# Patient Record
Sex: Female | Born: 1935 | Race: White | Hispanic: No | Marital: Married | Smoking: Never smoker
Health system: Southern US, Community
[De-identification: ages and names within clinical notes are randomized; demographics above are authoritative.]

## PROBLEM LIST (undated history)

## (undated) DIAGNOSIS — E785 Hyperlipidemia, unspecified: Secondary | ICD-10-CM

## (undated) DIAGNOSIS — M069 Rheumatoid arthritis, unspecified: Secondary | ICD-10-CM

## (undated) HISTORY — PX: CHOLECYSTECTOMY: SHX55

---

## 1998-08-30 ENCOUNTER — Other Ambulatory Visit: Admission: RE | Admit: 1998-08-30 | Discharge: 1998-08-30 | Payer: Self-pay | Admitting: Obstetrics and Gynecology

## 1999-02-20 ENCOUNTER — Ambulatory Visit (HOSPITAL_COMMUNITY): Admission: RE | Admit: 1999-02-20 | Discharge: 1999-02-20 | Payer: Self-pay | Admitting: Internal Medicine

## 1999-02-20 ENCOUNTER — Encounter: Payer: Self-pay | Admitting: Internal Medicine

## 1999-04-03 ENCOUNTER — Ambulatory Visit (HOSPITAL_COMMUNITY): Admission: RE | Admit: 1999-04-03 | Discharge: 1999-04-03 | Payer: Self-pay | Admitting: *Deleted

## 1999-04-03 ENCOUNTER — Encounter: Payer: Self-pay | Admitting: *Deleted

## 1999-10-30 ENCOUNTER — Encounter: Payer: Self-pay | Admitting: Internal Medicine

## 1999-10-30 ENCOUNTER — Ambulatory Visit (HOSPITAL_COMMUNITY): Admission: RE | Admit: 1999-10-30 | Discharge: 1999-10-30 | Payer: Self-pay | Admitting: Internal Medicine

## 2001-02-04 ENCOUNTER — Other Ambulatory Visit: Admission: RE | Admit: 2001-02-04 | Discharge: 2001-02-04 | Payer: Self-pay | Admitting: Obstetrics and Gynecology

## 2002-01-15 ENCOUNTER — Ambulatory Visit (HOSPITAL_COMMUNITY): Admission: RE | Admit: 2002-01-15 | Discharge: 2002-01-15 | Payer: Self-pay | Admitting: Gastroenterology

## 2002-01-15 ENCOUNTER — Encounter (INDEPENDENT_AMBULATORY_CARE_PROVIDER_SITE_OTHER): Payer: Self-pay | Admitting: Specialist

## 2002-01-19 ENCOUNTER — Ambulatory Visit (HOSPITAL_BASED_OUTPATIENT_CLINIC_OR_DEPARTMENT_OTHER): Admission: RE | Admit: 2002-01-19 | Discharge: 2002-01-19 | Payer: Self-pay | Admitting: Plastic Surgery

## 2002-04-22 ENCOUNTER — Emergency Department (HOSPITAL_COMMUNITY): Admission: EM | Admit: 2002-04-22 | Discharge: 2002-04-22 | Payer: Self-pay | Admitting: Emergency Medicine

## 2002-11-12 ENCOUNTER — Other Ambulatory Visit: Admission: RE | Admit: 2002-11-12 | Discharge: 2002-11-12 | Payer: Self-pay | Admitting: Obstetrics and Gynecology

## 2008-07-12 ENCOUNTER — Encounter: Admission: RE | Admit: 2008-07-12 | Discharge: 2008-07-12 | Payer: Self-pay | Admitting: Family Medicine

## 2008-07-12 IMAGING — US US ABDOMEN COMPLETE
1 series · 13 of 25 positions shown · non-contrast
Comparison: None.

CLINICAL DATA: Abdominal pain.  Nausea and vomiting.

COMPLETE ABDOMINAL ULTRASOUND [DATE]:

[Series 1: us abdomen complete · 0.23mm/px · 13 of 79 slices shown]
[im 1/79]
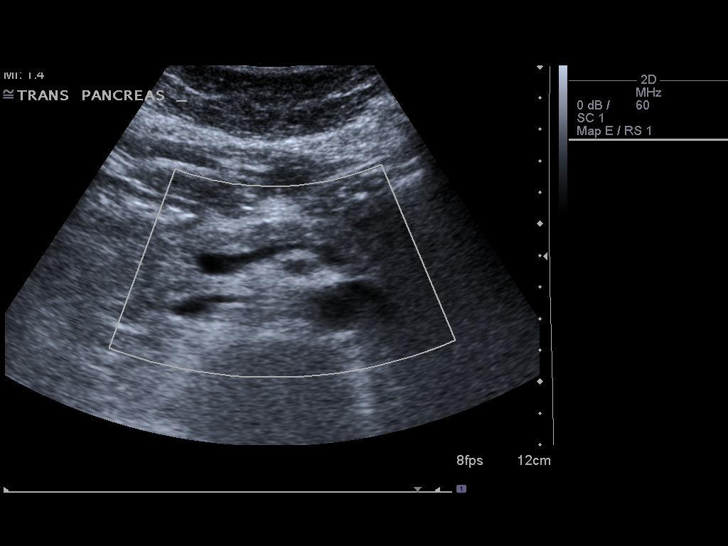
[im 7/79]
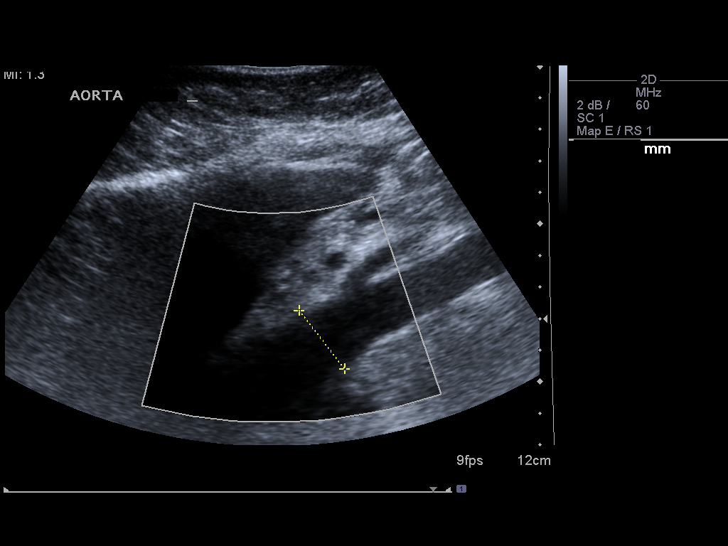
[im 14/79]
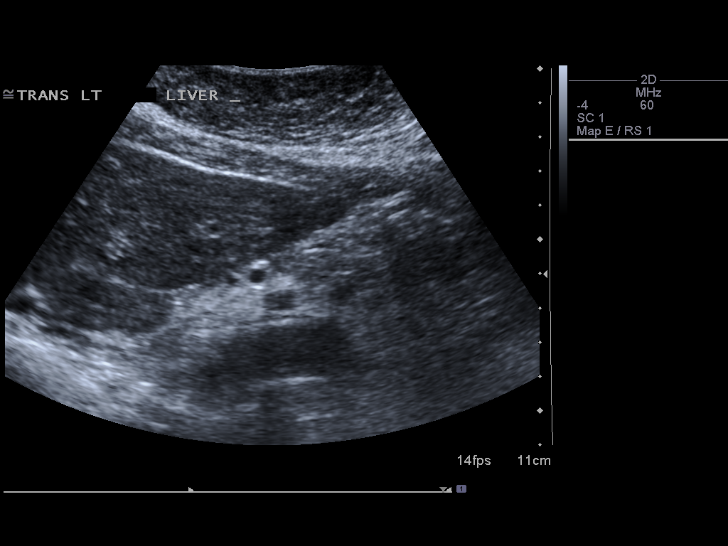
[im 20/79]
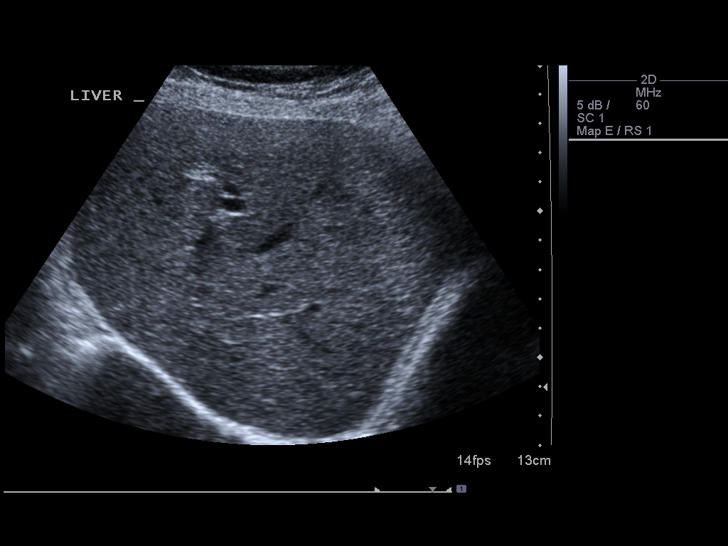
[im 27/79]
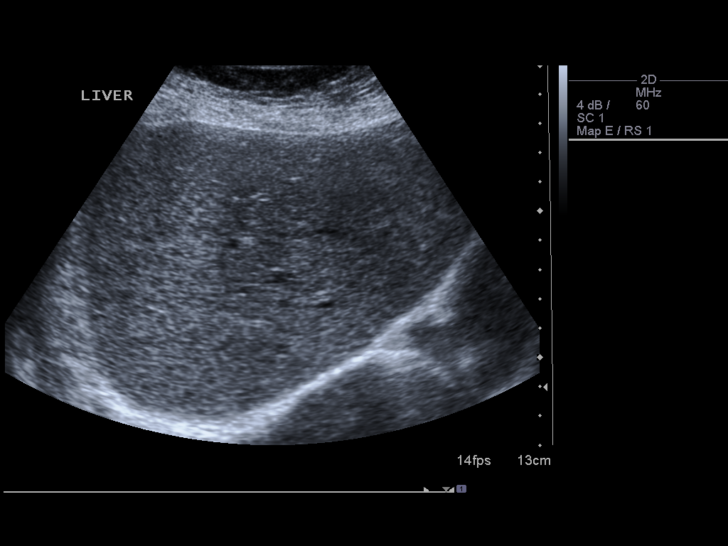
[im 33/79]
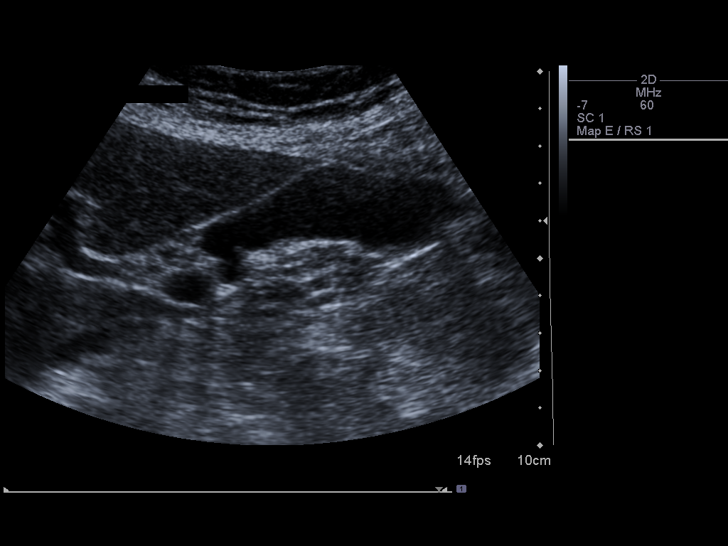
[im 40/79]
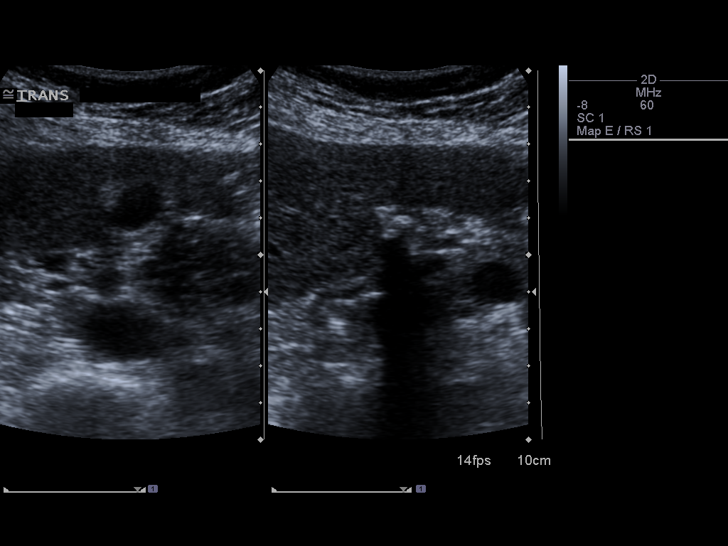
[im 46/79]
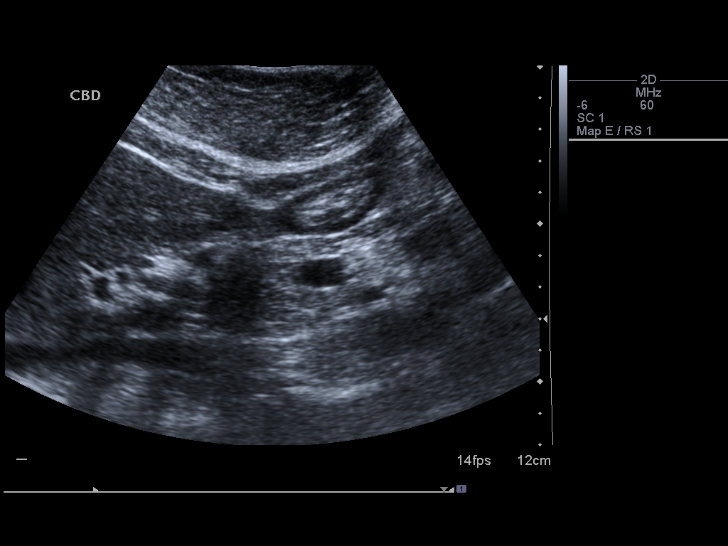
[im 53/79]
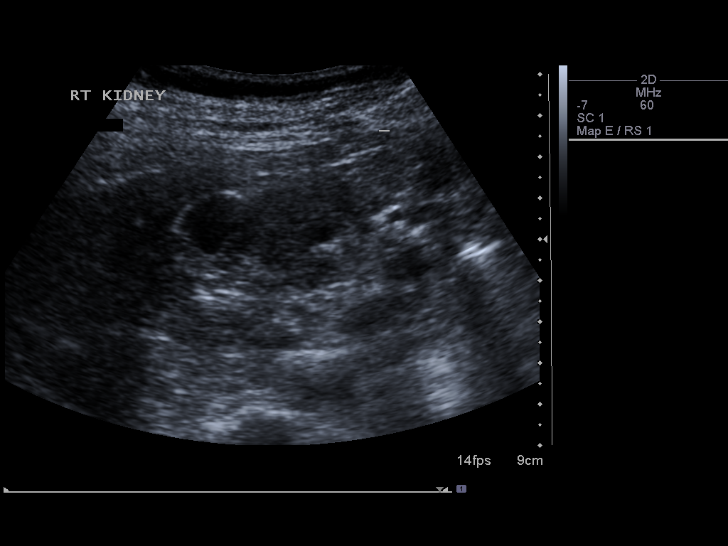
[im 59/79]
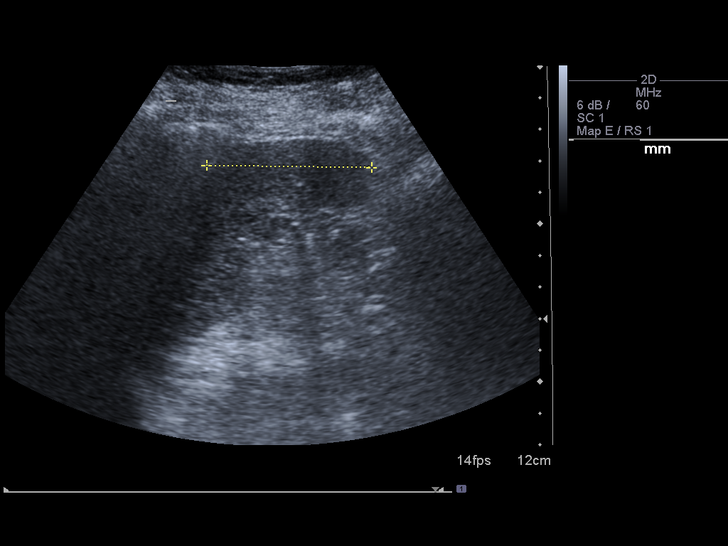
[im 66/79]
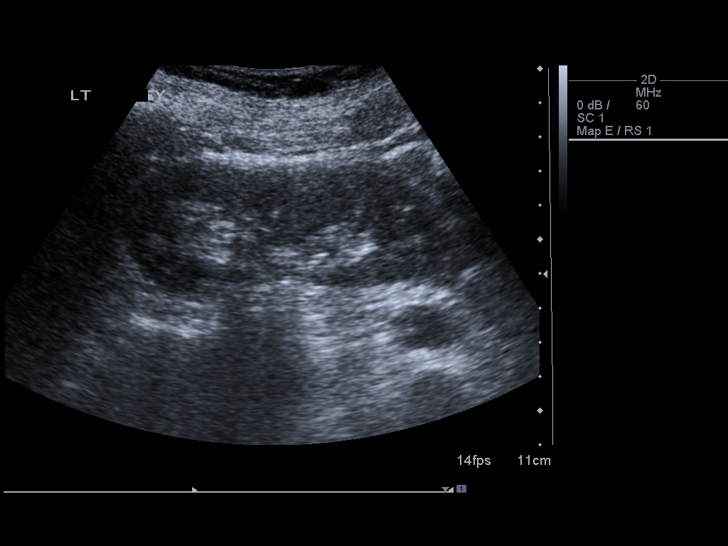
[im 72/79]
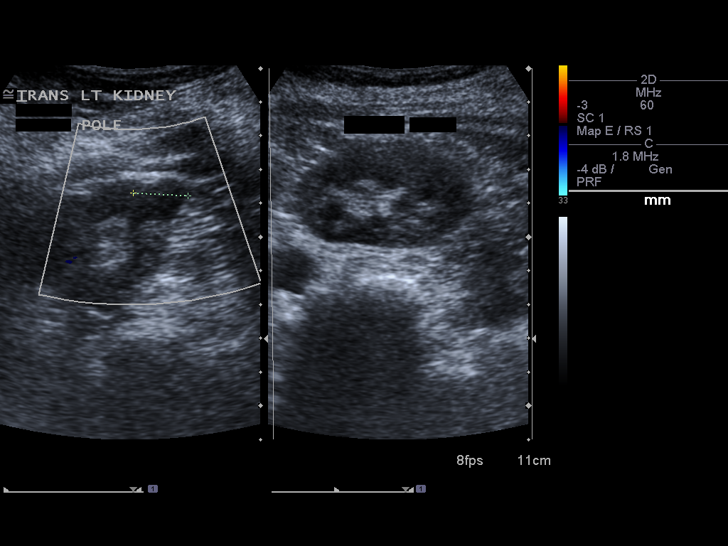
[im 79/79]
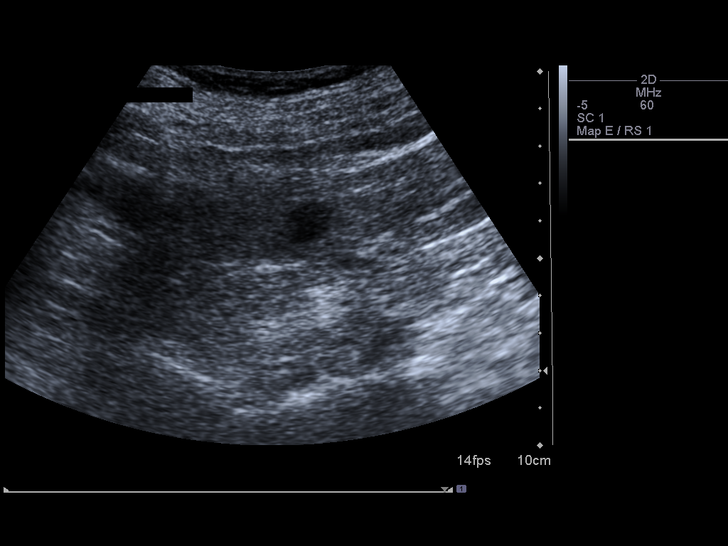

[13 of 25 positions shown; findings below may reference images not displayed]

Report of abdominal ultrasound [DATE]
describing multiple gallstones in the gallbladder and a common bile
duct stone.
FINDINGS: Gallbladder:  Shadowing gallstones, the largest approximating
cm. No gallbladder wall thickening or pericholecystic fluid.
Negative sonographic Murphy's sign according to the ultrasound
technologist.

Common bile duct:  Normal in caliber with maximum diameter
approximating 6 mm.  No common bile duct stone visualized.

Liver:  Normal size and echotexture without focal parenchymal
abnormality.  Patent portal vein with hepatopedal flow.

IVC:  Patent.

Pancreas:  Normal size and echotexture without focal parenchymal
abnormality.

Spleen:  Normal size and echotexture without focal parenchymal
abnormality, measuring approximately 5.2 cm length.

Right Kidney:  No hydronephrosis.  Normal parenchymal echotexture.
Well-preserved cortex.  Approximate 1.7 x 1.5 x 1.4 cm simple cyst
in the upper pole.  No significant focal parenchymal abnormality.
Approximately 9.0 cm length.

Left Kidney:  No hydronephrosis.  Normal parenchymal echotexture.
Well-preserved cortex.  2 adjacent cysts arising from the upper
pole, the more lateral of which measures approximately 1.7 x 1.5 x
1.6 cm, and the more medial of which measures 1.5 x 1.6 x 1.5 cm.
A mid pole simple cyst measures approximately 1.2 x 1.1 x 0.9 cm.
No solid parenchymal lesions.  Approximately 9.9 cm length.

Abdominal aorta:  Normal in caliber throughout its visualized
course in the abdomen with maximum diameter approximating 2.3 cm.

Other Findings:  None.
IMPRESSION: 1.  Cholelithiasis without CT evidence of acute cholecystitis.
2.  No common bile duct stone as was noted on the report of a prior
abdominal ultrasound from [DATE].  No biliary ductal
dilation.
3.  No significant abnormalities otherwise.  Small simple cysts in
both kidneys.

## 2008-08-04 IMAGING — CR DG CHEST 2V
2 series · 2 of 2 positions shown · non-contrast
Comparison: None

CLINICAL DATA: Preoperative evaluation.

CHEST - 2 VIEW

[w chest pa]
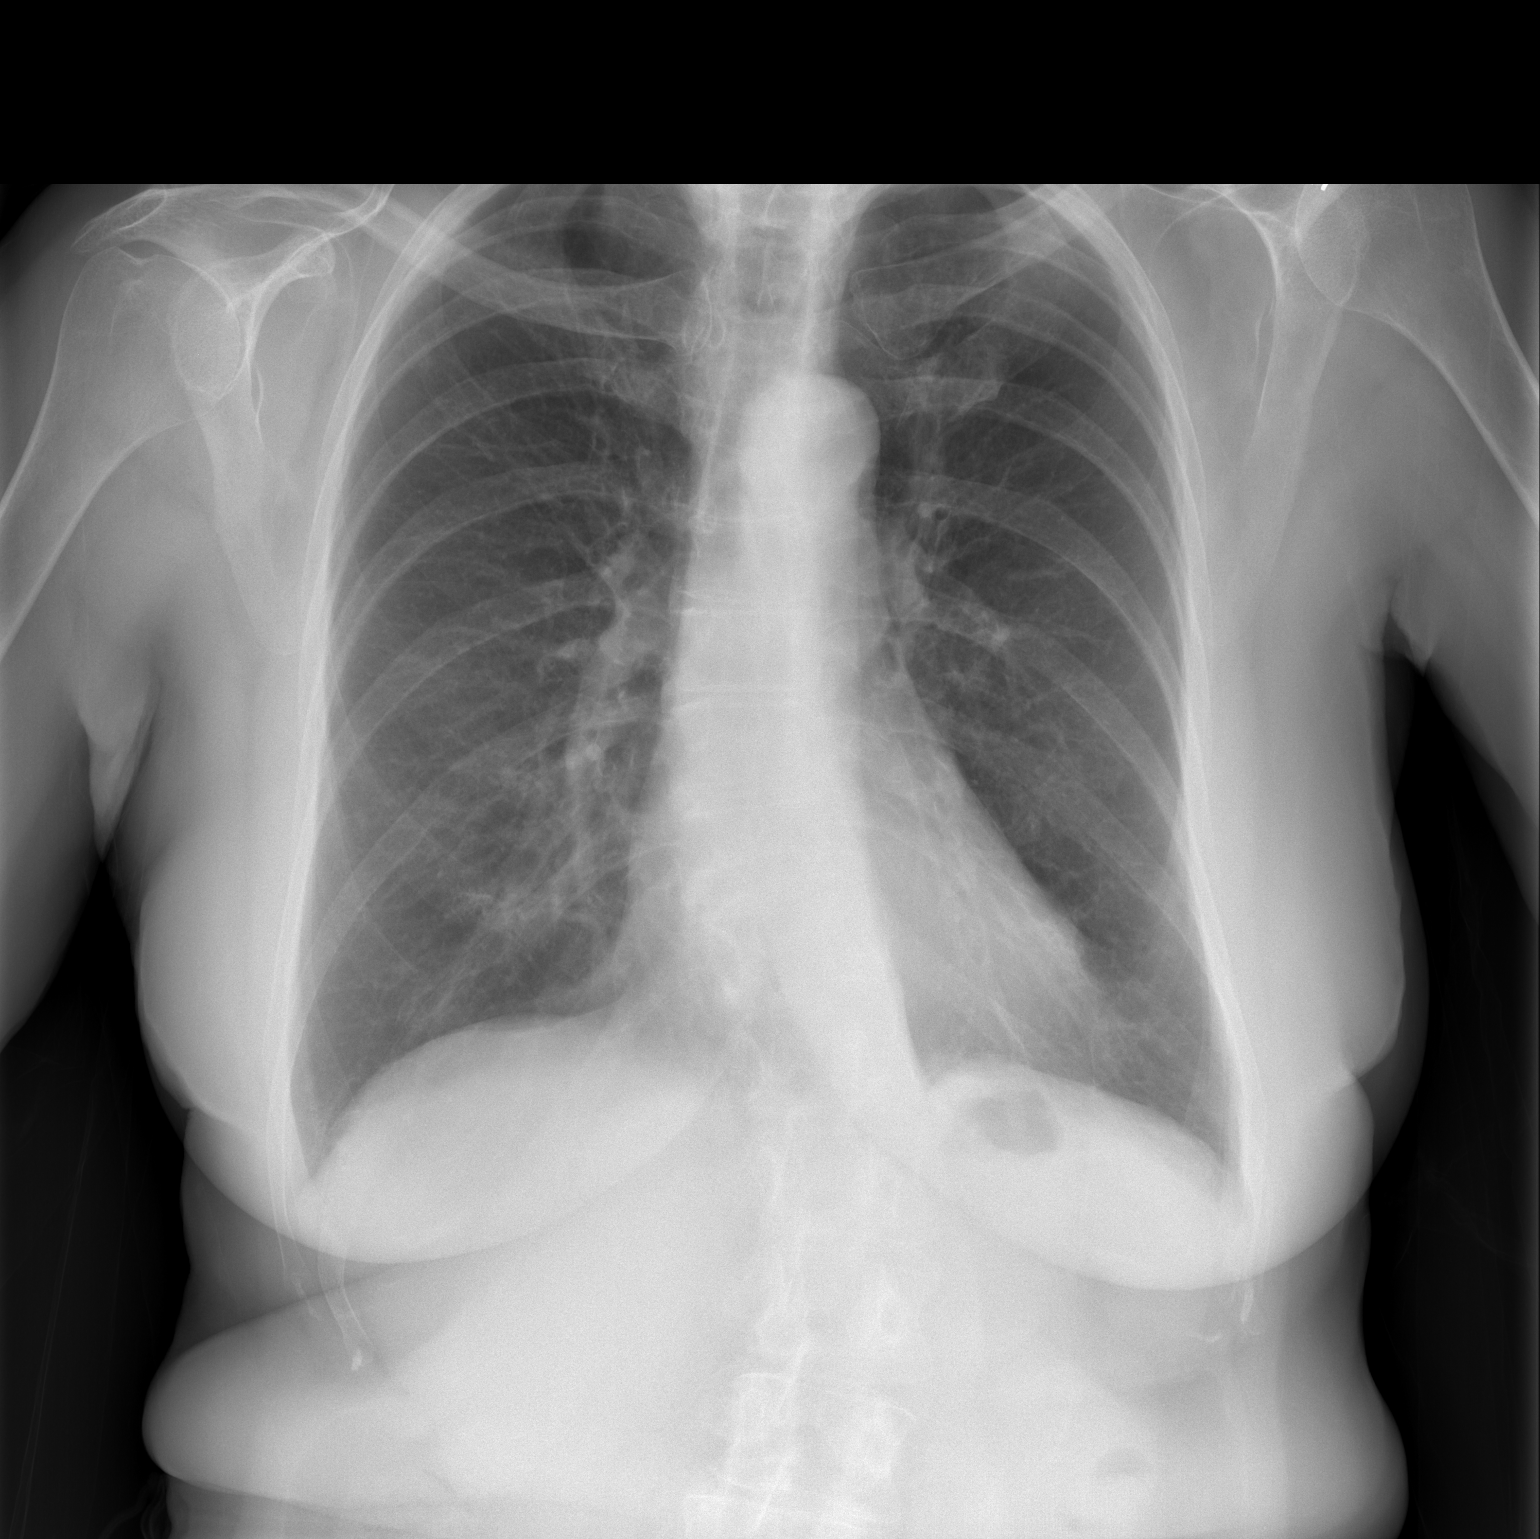

[w chest lat]
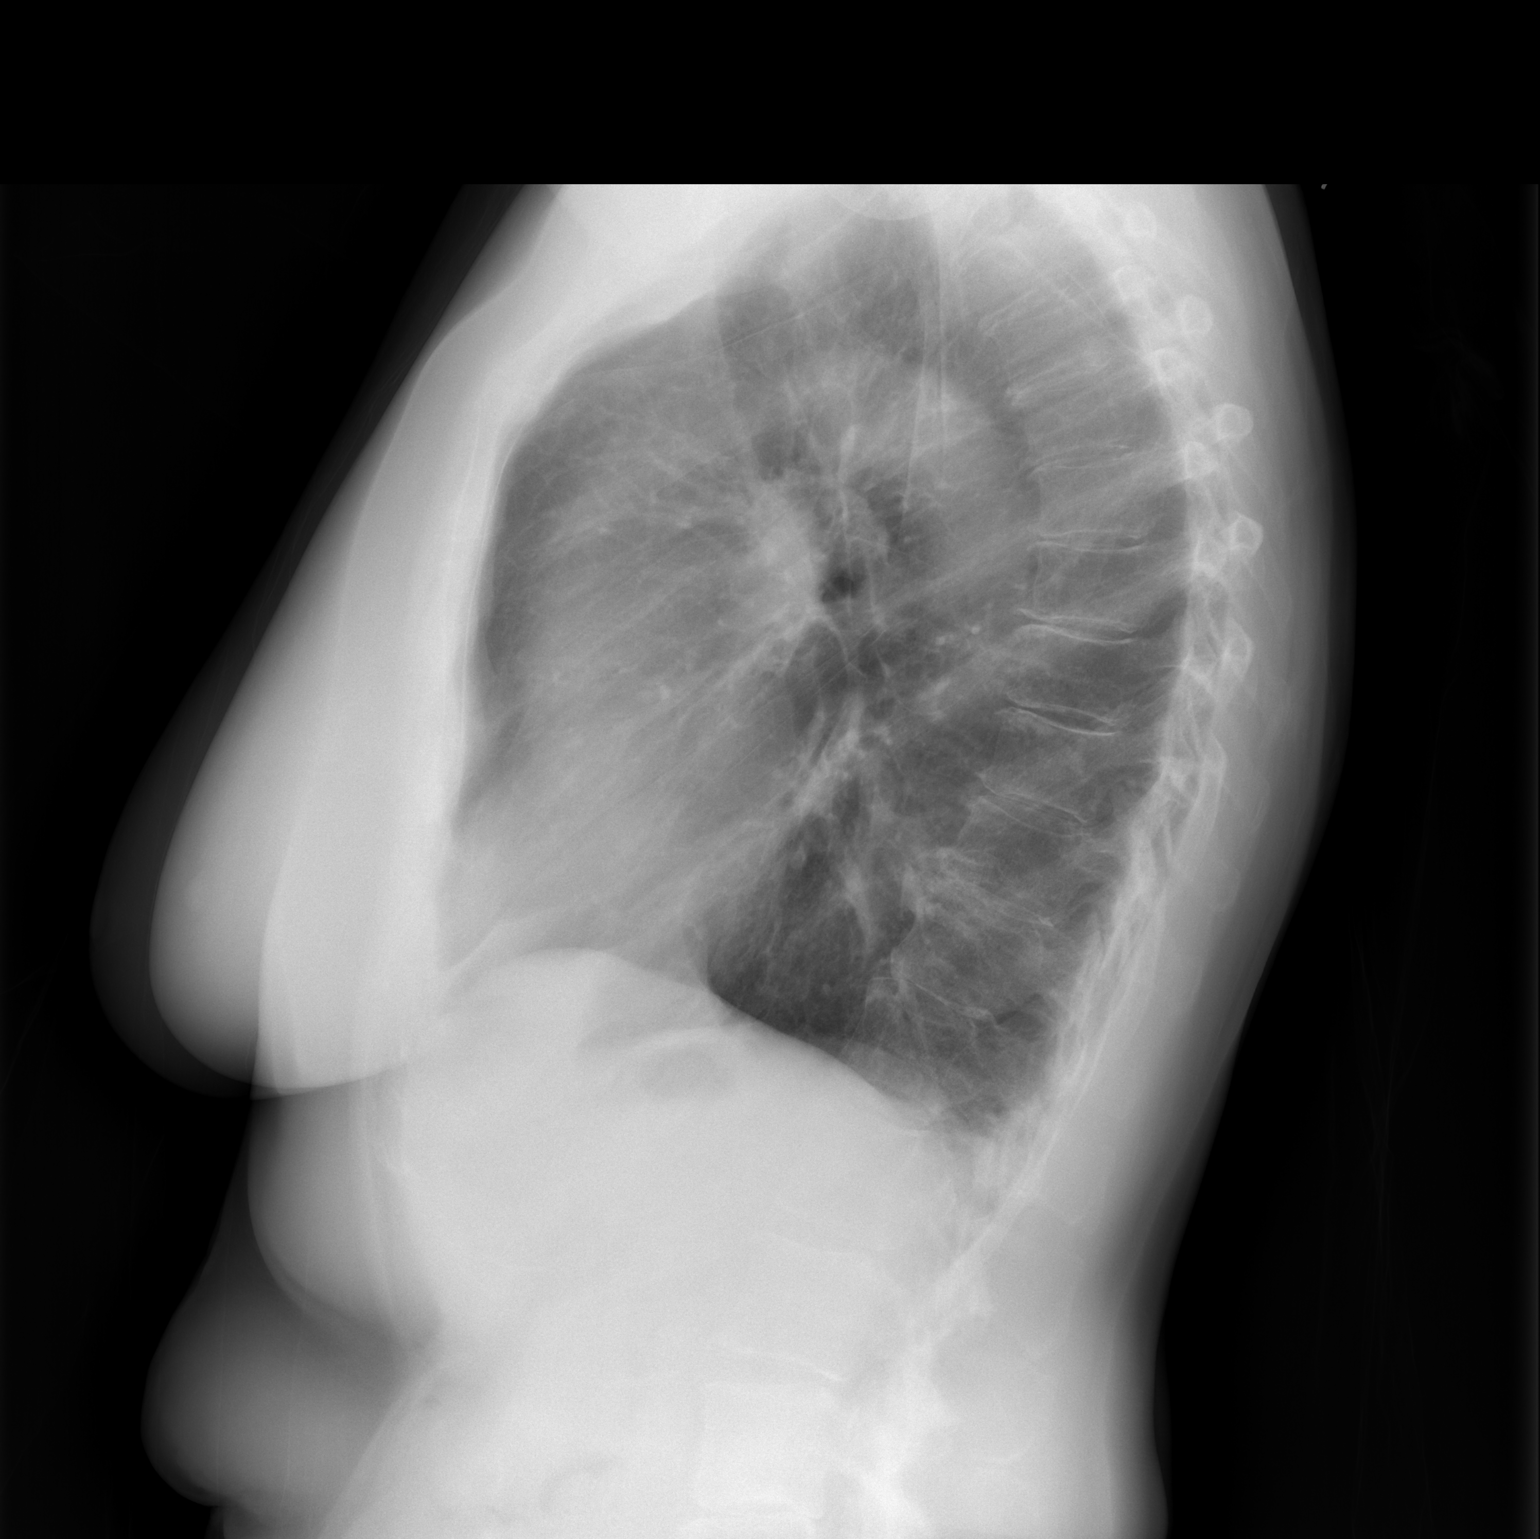

[2 of 2 positions shown; findings below may reference images not displayed]

FINDINGS: Rotoscoliotic deformity of the spine.  Pulmonary
interstitial densities appear chronic.  Negative for edema,
infiltrates or effusions.  The heart and mediastinal contours are
normal.
IMPRESSION: No acute cardiopulmonary process.

## 2008-08-08 ENCOUNTER — Encounter (INDEPENDENT_AMBULATORY_CARE_PROVIDER_SITE_OTHER): Payer: Self-pay | Admitting: General Surgery

## 2008-08-08 ENCOUNTER — Ambulatory Visit (HOSPITAL_COMMUNITY): Admission: RE | Admit: 2008-08-08 | Discharge: 2008-08-08 | Payer: Self-pay | Admitting: General Surgery

## 2008-08-08 IMAGING — RF DG CHOLANGIOGRAM OPERATIVE
1 series · 4 of 4 positions shown · IV contrast (omnipaque)
Comparison: Ultrasound [DATE]

CLINICAL DATA: Gallstones.

INTRAOPERATIVE CHOLANGIOGRAM
TECHNIQUE: Multiple fluoroscopic spot radiographs were obtained
during intraoperative cholangiogram and are submitted for
interpretation post-operatively.
Contrast: Omnipaque

[Series 1: run · 4 of 222 frames shown]
[frame 34/222]
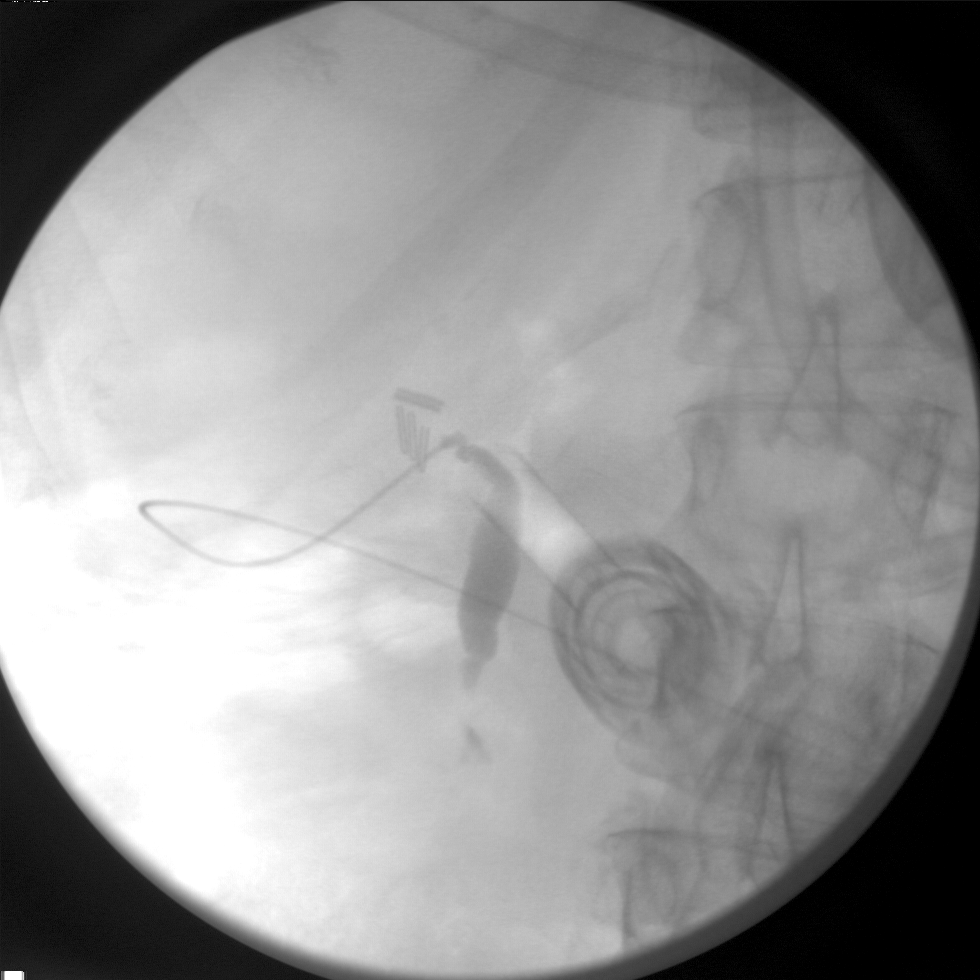
[frame 112/222]
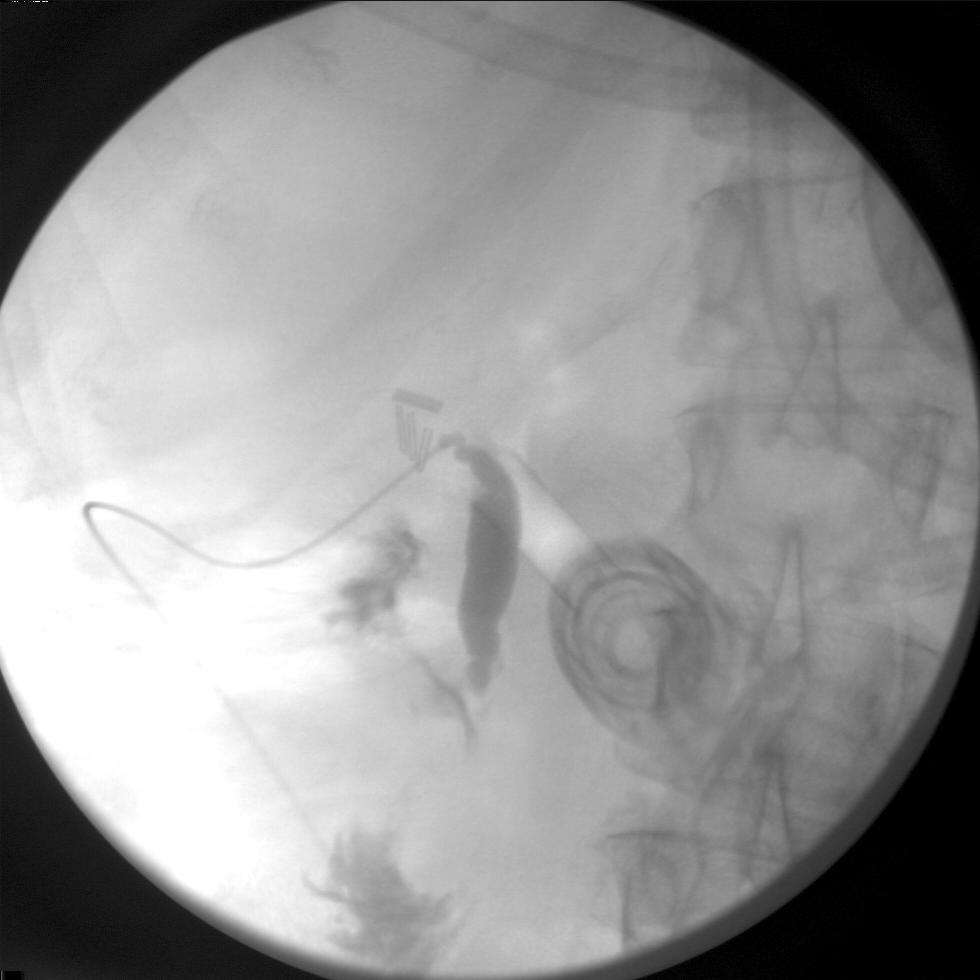
[frame 189/222]
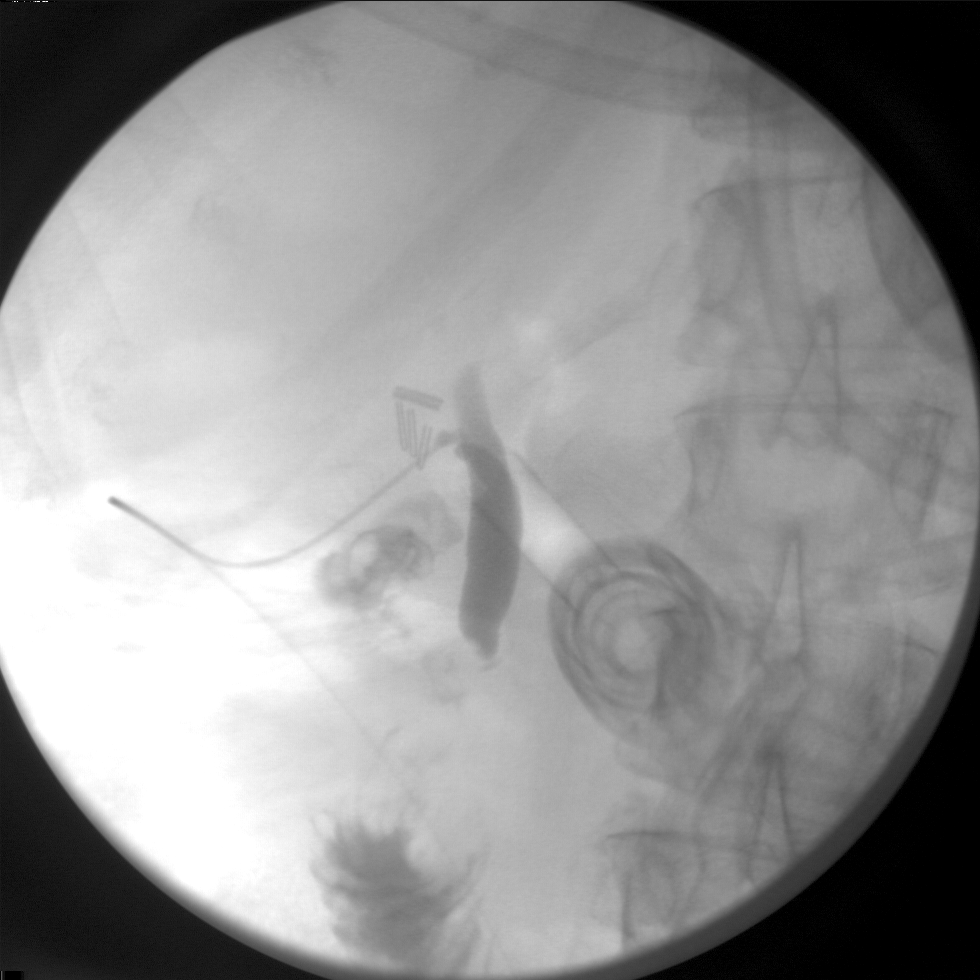
[frame 217/222]
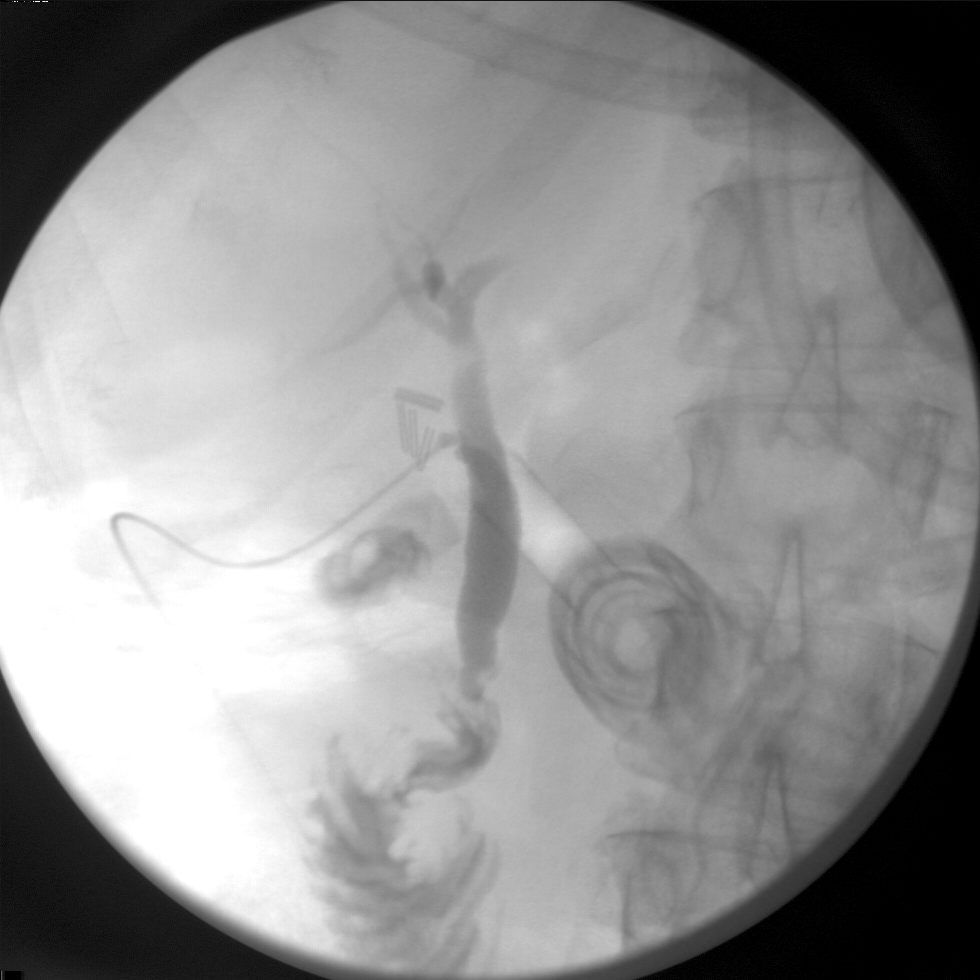

[4 of 4 positions shown; findings below may reference images not displayed]

FINDINGS: Contrast injection of the cystic duct led to the
satisfactory opacification of the biliary ducts.  No ductal
stricture, dilatation, or filling defects.  Contrast passes into
the duodenum.
IMPRESSION: Negative operative cholangiogram.

## 2009-10-09 ENCOUNTER — Encounter: Admission: RE | Admit: 2009-10-09 | Discharge: 2009-10-09 | Payer: Self-pay | Admitting: Internal Medicine

## 2009-10-09 IMAGING — CT CT ABD-PELV W/ CM
2 of 5 series · 17 of 46 positions shown, 19 images · IV contrast (30CC OMNI 300 & [ID] OMNI 300)
Comparison: None.

CLINICAL DATA: Bilateral lower quadrant pain.

CT ABDOMEN AND PELVIS WITH CONTRAST
TECHNIQUE: Multidetector CT imaging of the abdomen and pelvis was
performed following the standard protocol during bolus
administration of intravenous contrast.
Contrast: 100 ml [0Z]

[Series 2: abdomen w/ · axial · 0.70mm/px · z∈[-404,-69]mm · 14 of 77 slices shown, 16 images]
[im 5/77  soft-tissue]
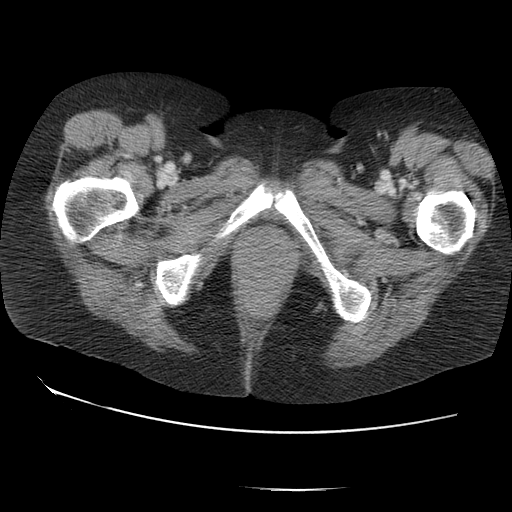
[im 5/77  bone]
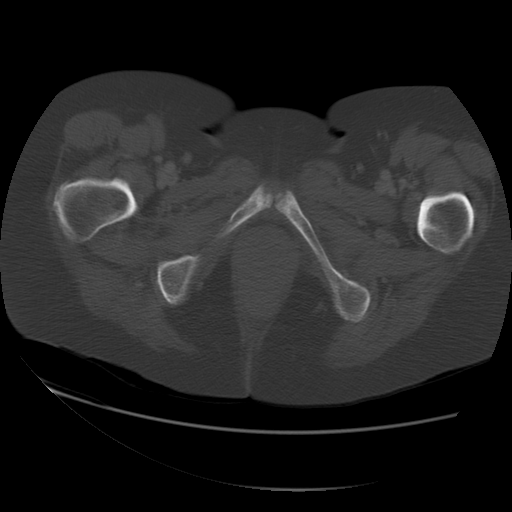
[im 9/77  soft-tissue]
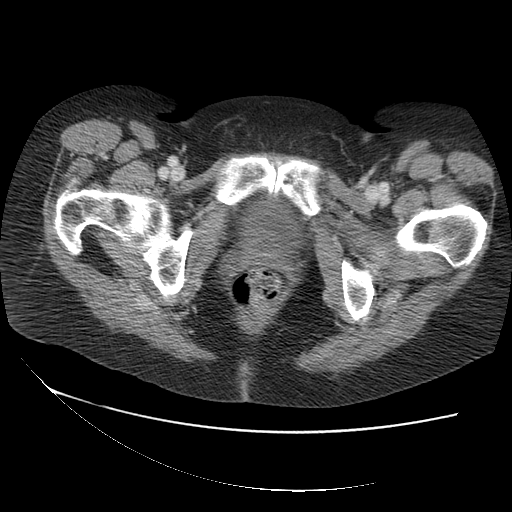
[im 17/77  soft-tissue]
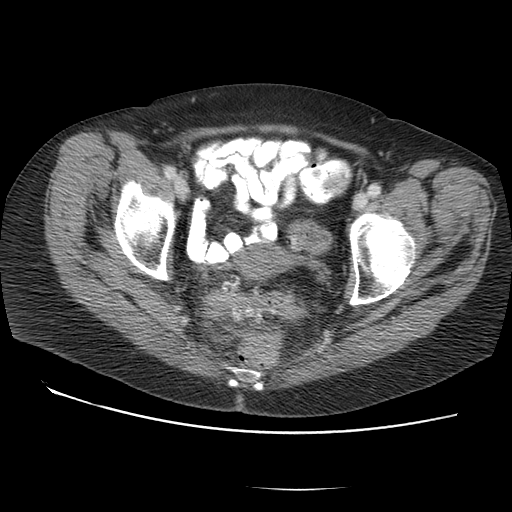
[im 22/77  soft-tissue]
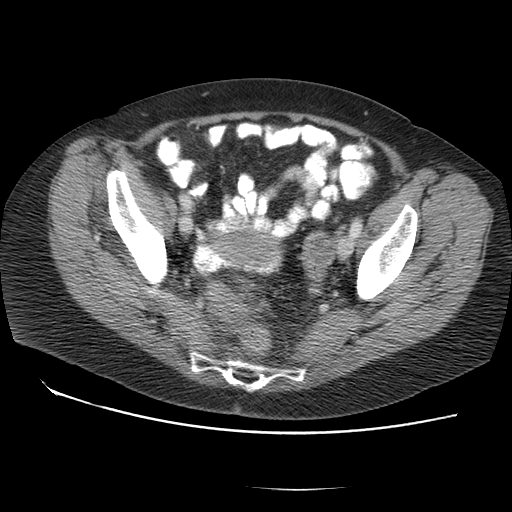
[im 26/77  soft-tissue]
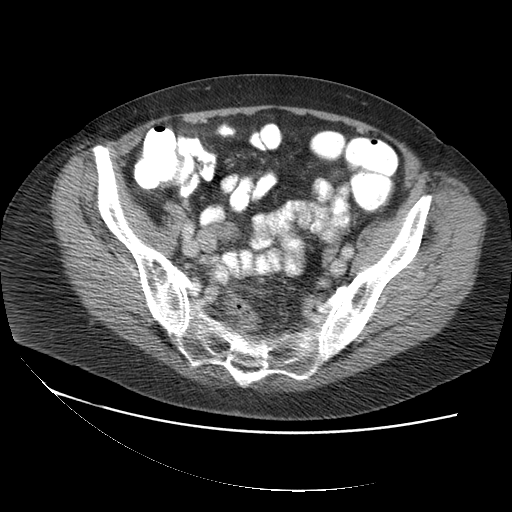
[im 30/77  soft-tissue]
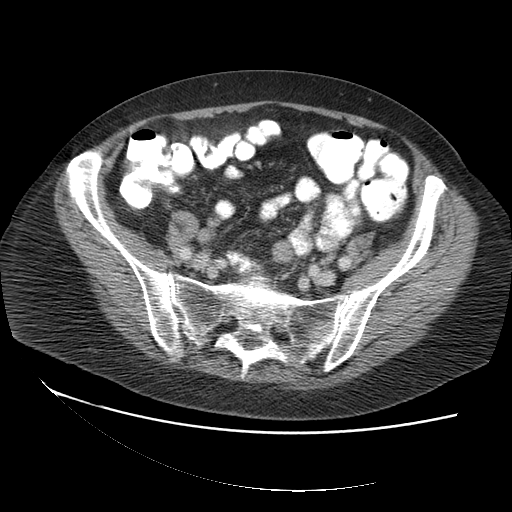
[im 34/77  soft-tissue]
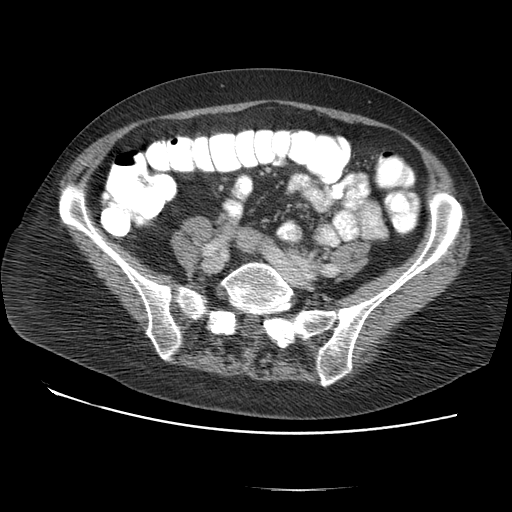
[im 43/77  soft-tissue]
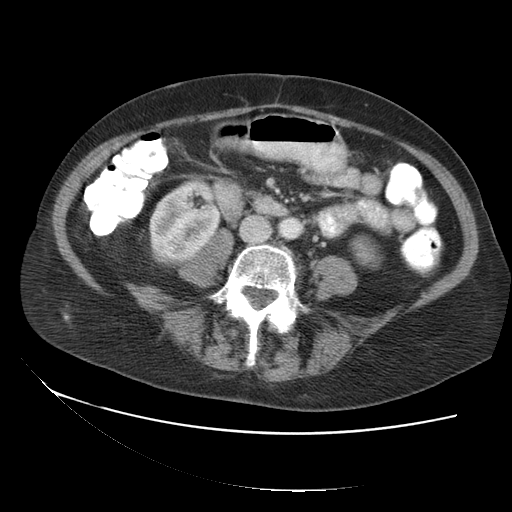
[im 47/77  soft-tissue]
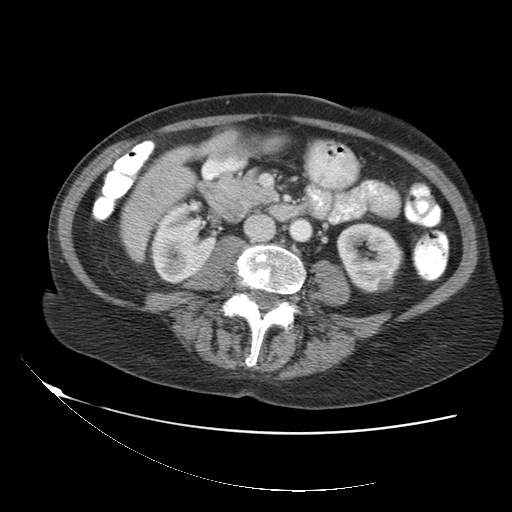
[im 47/77  bone]
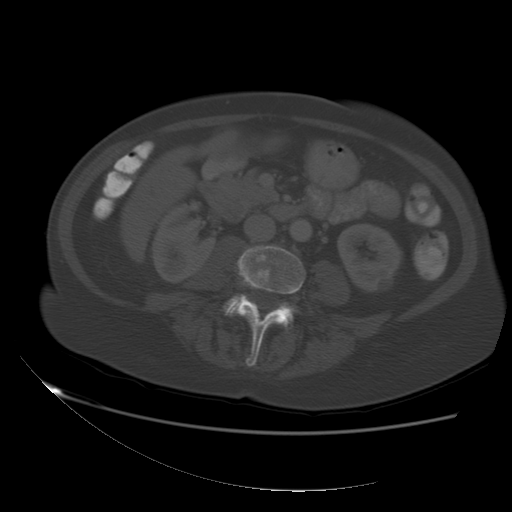
[im 51/77  soft-tissue]
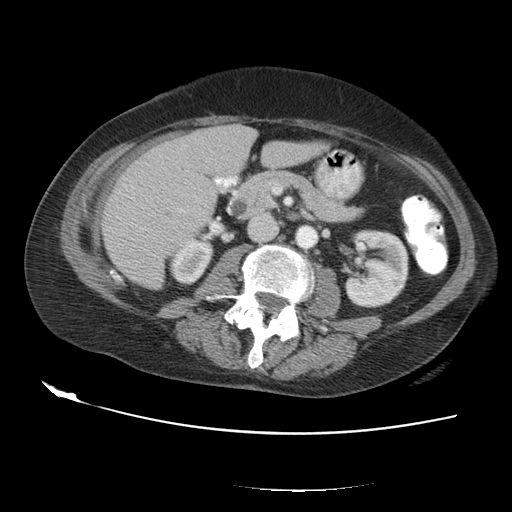
[im 55/77  soft-tissue]
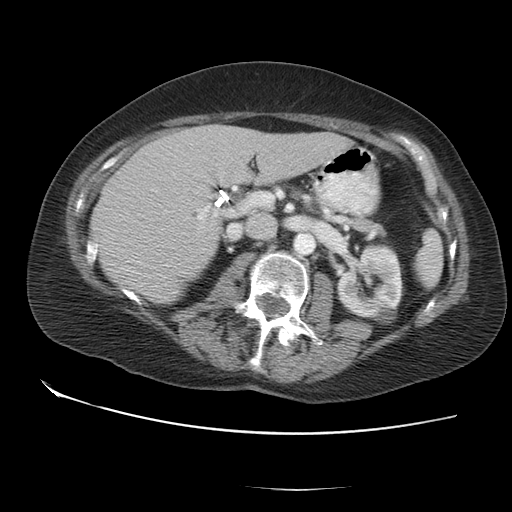
[im 60/77  soft-tissue]
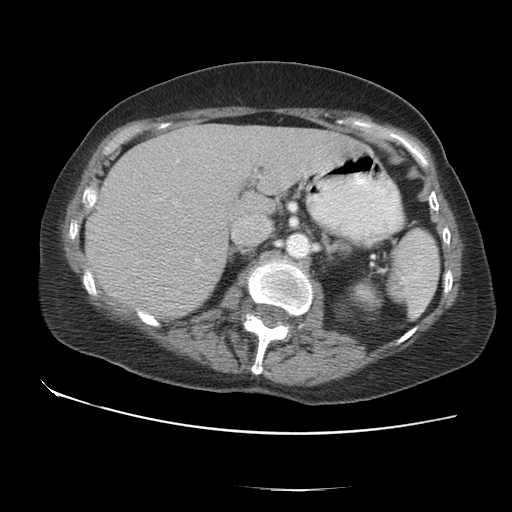
[im 68/77  soft-tissue]
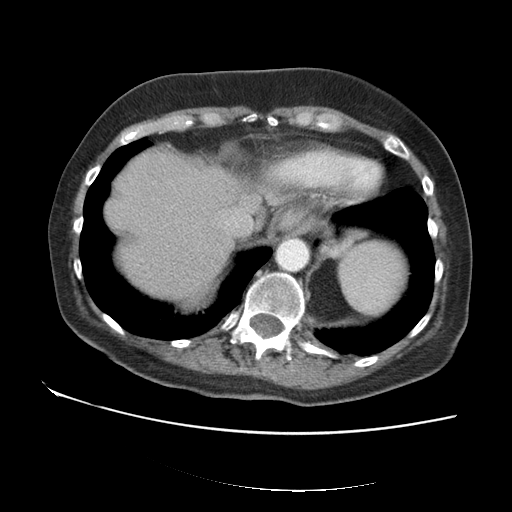
[im 72/77  soft-tissue]
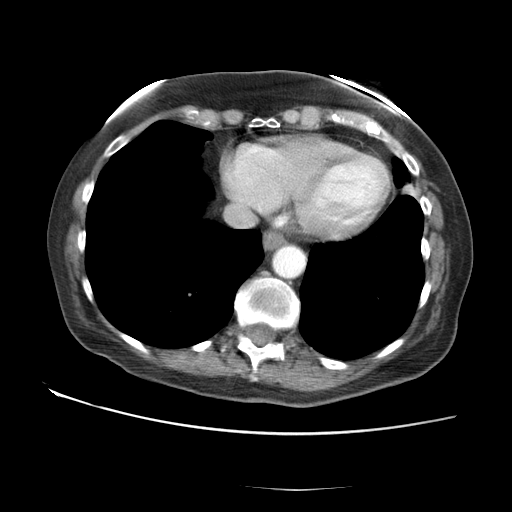

[Series 400: cor abd · coronal · 0.78mm/px · 3 of 106 slices shown]
[im 36/106  soft-tissue]
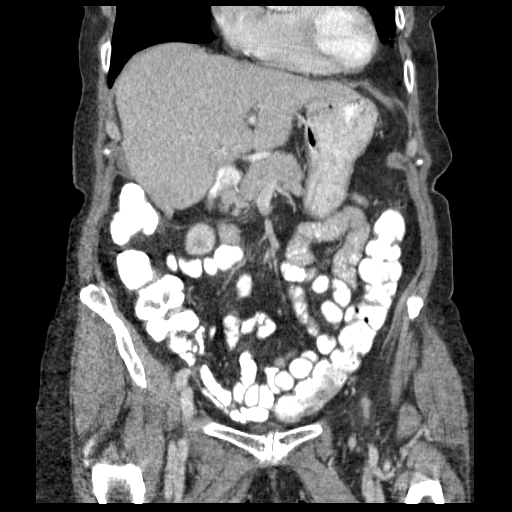
[im 47/106  soft-tissue]
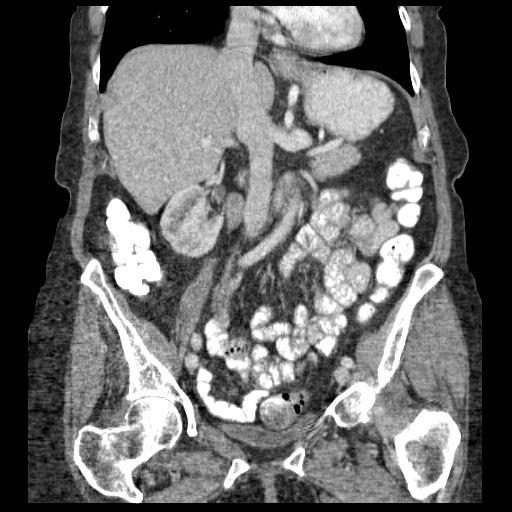
[im 59/106  soft-tissue]
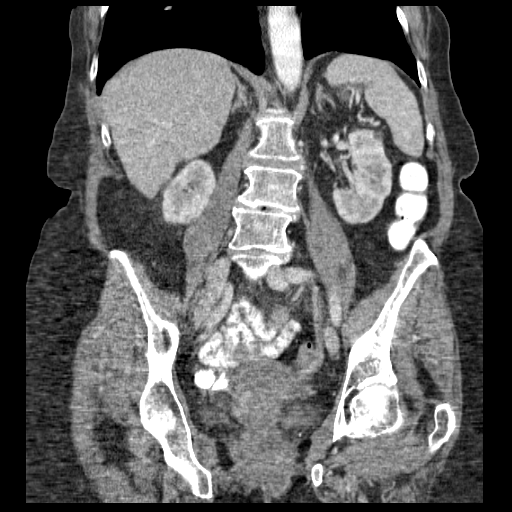

[17 of 46 positions shown; findings below may reference images not displayed]

BUN and creatinine were obtained on site.  Results:  BUN 15 mg/dL,
Creatinine 1.0 mg/dL.
FINDINGS: 3 mm left lower lobe pulmonary nodule is seen on image
11.

13 mm subcutaneous nodule in the right anterolateral upper
abdominal wall is probably a sebaceous cyst.  No focal abnormality
is noted in the liver or spleen.  The stomach, duodenum, pancreas,
and adrenal glands are normal.  Gallbladder is surgically absent.
Extrahepatic common duct measures up to 8 mm in diameter,
considered upper normal given the patients age and prior
cholecystectomy.  Multiple low-density lesions in the kidneys are
most compatible with cysts.

No abdominal aortic aneurysm.  No abdominal lymphadenopathy.  No
evidence for free fluid in the abdomen.  The abdominal bowel loops
have normal features.

No pelvic sidewall lymphadenopathy.  The bladder is normal.  Uterus
is unremarkable.  No adnexal mass.

Diverticular changes are seen in the sigmoid colon.  There is
edema/inflammation associated with the distal sigmoid colon, near
the rectosigmoid junction.  No evidence for extraluminal gas or
focal fluid collection to suggest abscess.  The terminal ileum is
normal.  The appendix is normal.

Bone windows reveal no worrisome lytic or sclerotic osseous
lesions.
IMPRESSION: Diverticulitis of the distal-most segment of the sigmoid colon,
just proximal to the rectum.  No evidence for perforation or
abscess.  The area of involved colon is in the posterior lower
pelvis, just posterior to the fundus of the uterus.  As neoplasm
can present with similar imaging features, follow-up is
recommended.

## 2010-06-26 LAB — URINALYSIS, ROUTINE W REFLEX MICROSCOPIC
Glucose, UA: NEGATIVE mg/dL
Hgb urine dipstick: NEGATIVE
Specific Gravity, Urine: 1.016 (ref 1.005–1.030)

## 2010-06-26 LAB — COMPREHENSIVE METABOLIC PANEL
ALT: 14 U/L (ref 0–35)
Calcium: 10.1 mg/dL (ref 8.4–10.5)
Glucose, Bld: 106 mg/dL — ABNORMAL HIGH (ref 70–99)
Sodium: 143 mEq/L (ref 135–145)
Total Protein: 6.9 g/dL (ref 6.0–8.3)

## 2010-06-26 LAB — CBC
Hemoglobin: 13.2 g/dL (ref 12.0–15.0)
MCHC: 33.6 g/dL (ref 30.0–36.0)
RDW: 14.2 % (ref 11.5–15.5)

## 2010-06-26 LAB — DIFFERENTIAL
Eosinophils Absolute: 0 10*3/uL (ref 0.0–0.7)
Lymphs Abs: 1.4 10*3/uL (ref 0.7–4.0)
Monocytes Relative: 15 % — ABNORMAL HIGH (ref 3–12)
Neutro Abs: 2.1 10*3/uL (ref 1.7–7.7)
Neutrophils Relative %: 51 % (ref 43–77)

## 2010-07-31 NOTE — Op Note (Signed)
NAMEAMELIANA, BRASHEAR          ACCOUNT NO.:  [id number]   MEDICAL RECORD NO.:  [id number]          PATIENT TYPE:  AMB   LOCATION:  DAY                          FACILITY:  [facility]   PHYSICIAN:  Angelia Mould. Derrell Lolling, M.D.DATE OF BIRTH:  [birth year]   DATE OF PROCEDURE:  08/08/2008  DATE OF DISCHARGE:                               OPERATIVE REPORT   PREOPERATIVE DIAGNOSIS:  Chronic cholecystitis with cholelithiasis.   POSTOPERATIVE DIAGNOSIS:  Chronic cholecystitis with cholelithiasis.   OPERATION PERFORMED:  Laparoscopic cholecystectomy with intraoperative  cholangiogram.   SURGEON:  Dr. Claud Kelp.   FIRST ASSISTANT:  Dr. Consuello Bossier.   OPERATIVE INDICATIONS:  This is a 76 year old Caucasian female who is  known to have gallstones for many years but more recently she had been  having episodes of postprandial nausea and epigastric discomfort.  She  had severe attack of pain recently with vomiting and diaphoresis.  She  had a recent gallbladder ultrasound which showed multiple shadowing  gallstones but no other abnormalities.  She was evaluated and counseled  as an outpatient.  She is brought to operating room electively.   OPERATIVE FINDINGS:  The gallbladder was large but thin-walled.  It was  discolored but not acutely inflamed.  The anatomy of the cystic duct and  cystic artery and common bile duct were conventional.  The cholangiogram  showed normal intrahepatic and extrahepatic bile duct anatomy, normal  intrahepatic and extrahepatic structures, no filling defects, and no  obstruction with good flow of contrast into the duodenum.  The liver,  stomach, duodenum, omentum and peritoneal surfaces looked normal.   OPERATIVE TECHNIQUE:  Following induction of general endotracheal  anesthesia, the patient's abdomen was prepped and draped in sterile  fashion.  Intravenous antibiotics were given.  The patient was  identified as correct patient and correct procedure and a  time-out was  held.  0.5% Marcaine with epinephrine was used as a local infiltration  anesthetic.   A vertically oriented incision was made at the lower rim of the  umbilicus.  The fascia was incised in the midline and the abdominal  cavity entered under direct vision.  A 10-mm Hassan trocar was inserted  and secured the pursestring suture of 0 Vicryl.  Pneumoperitoneum was  created.  Video cam was inserted with visualization and findings as  described above.  An 11-mm trocar was placed in the subxiphoid region  and two 5 mm trocars placed in the right midabdomen.  The gallbladder  fundus was identified and elevated.  There were adhesions to the lower  body and infundibulum which were dissected away.  These were soft and  chronic adhesions.  The infundibulum was retracted laterally.  We  dissected the peritoneum off of the cystic duct and cystic artery.  We  carefully isolated the cystic artery as it went onto the wall of the  gallbladder, secured it with multiple metal clips and divided it.  We  then able to create a large window and a nice critical view of the  cystic duct.  A cholangiogram catheter was inserted in the cystic duct  and a cholangiogram obtained with the  C-arm.  The cholangiogram was  normal as described above.  The cholangiogram catheter was removed, the  cystic duct secured with multiple metal clips and divided.  The  gallbladder dissected from its bed with electrocautery placed in the  specimen bag and removed.  The operative field was completely clean.  There is no bleeding and no bile leak whatsoever.  The trocars were  removed under direct vision.  There was no bleeding from trocar sites.  The pneumoperitoneum was released.  The fascia at the umbilicus was  closed with 0 Vicryl sutures.  The incisions were irrigated with saline  and skin closed with subcuticular suture of 4-0 Monocryl and Steri-  Strips.  Clean bandages were placed and the patient taken recovery  room  in stable condition.  Estimated blood loss was about 10 mL or less.  Complications none.  Sponge, needle and instrument counts were correct.      Angelia Mould. Derrell Lolling, M.D.  Electronically Signed     HMI/MEDQ  D:  08/08/2008  T:  08/08/2008  Job:  [id number]   cc:   Otilio Connors. Gerri Spore, M.D.  Fax: [phone]   Lemmie Evens, M.D.  Fax: [phone]   Petra Kuba, M.D.  Fax: [phone]

## 2010-08-03 NOTE — Op Note (Signed)
NAME:  Danielle Hebert, Danielle Hebert                  ACCOUNT NO.:  [id number]   MEDICAL RECORD NO.:  [id number]                   PATIENT TYPE:  AMB   LOCATION:  ENDO                                 [facility]ACILITY:  [facility]   PHYSICIAN:  Petra Kuba, M.D.                 DATE OF BIRTH:  09/24/1934   DATE OF PROCEDURE:  01/15/2002  DATE OF DISCHARGE:                                 OPERATIVE REPORT   PROCEDURE:  Colonoscopy with polypectomy.   INDICATIONS:  Guaiac positivity, abdominal pain.  Consent was signed after  risks, benefits, methods, and options thoroughly discussed in the office.   MEDICATIONS:  Demerol 50 mg, Versed 6 mg.   DESCRIPTION OF PROCEDURE:  Rectal inspection was pertinent for significant  external hemorrhoids.  Digital exam was negative.  The pediatric video  adjustable colonoscope was inserted, fairly easily advanced around the colon  to the cecum.  This did require some abdominal pressure but no position  changes.  Other than some scattered left-sided diverticula, no abnormalities  were seen on insertion.  The cecum was identified by the appendiceal orifice  and the ileocecal valve.  In fact, the scope was inserted a short way into  the terminal ileum, which was normal.  Photo documentation was obtained.  The scope was slowly withdrawn.  The prep was adequate.  There was some  liquid stool that required washing and suctioning.  On slow withdrawal  through the colon in the mid-ascending, a tiny polyp was seen and was hot  biopsied x2.  The scope was further withdrawn.  Other than the rare left-  sided diverticula, no other abnormalities were seen as we slowly withdrew  back to the rectum.  Once back in the rectum, the scope was then  retroflexed, pertinent for some small internal hemorrhoids.  The scope was  straightened and readvanced a short way up the left side of the colon, air  was suctioned, and scope removed.  The patient tolerated the procedure well.  There was  no obvious immediate complication.   ENDOSCOPIC DIAGNOSES:  1. External greater than internal hemorrhoids.  2. Rare left-sided diverticula.  3. Tiny ascending polyp, hot biopsied.  4. Otherwise within normal limits to the terminal ileum.    PLAN:  Continue workup with an EGD.  Await pathology, but probably recheck  colon screening in five years.  Otherwise, will see back p.r.n. or in two  months to recheck guaiacs, symptoms, and make sure no further workup plans  are needed.                                               Petra Kuba, M.D.    MEM/MEDQ  D:  01/15/2002  T:  01/16/2002  Job:  [id number]   cc:   Otilio Connors. Gerri Spore, M.D.  [address]  [place]  [place] [address]  Fax: [phone]   Lemmie Evens, M.D.  [address]  [place]  [place] [address]  Fax: [phone]

## 2010-08-03 NOTE — Op Note (Signed)
NAME:  Danielle Hebert, Danielle Hebert                  ACCOUNT NO.:  [id number]   MEDICAL RECORD NO.:  [id number]                   PATIENT TYPE:  AMB   LOCATION:  ENDO                                 [facility]ACILITY:  [facility]   PHYSICIAN:  Petra Kuba, M.D.                 DATE OF BIRTH:  [birth year]   DATE OF PROCEDURE:  01/15/2002  DATE OF DISCHARGE:                                 OPERATIVE REPORT   PROCEDURE:  Esophagogastroduodenoscopy with biopsy.   INDICATIONS:  Abdominal pain, guaiac positivity, nondiagnostic colonoscopy.  Consent was signed after risks, benefits, methods, and options thoroughly  discussed in the office and before any premedications given.   MEDICATIONS:  Additional medicines for this procedure:  Demerol 25 mg,  Versed 3 mg, since it followed the colonoscopy.   DESCRIPTION OF PROCEDURE:  The video endoscope was inserted by direct  vision.  The proximal and midesophagus was normal.  In the distal esophagus  was a small hiatal hernia.  The scope passed into the stomach, advanced to  the antrum, pertinent for a moderate amount of antritis, advanced through a  normal pylorus, into a normal duodenal bulb and around the C-loop to a  normal second portion of the duodenum.  The scope was withdrawn back to the  bulb, and a good look there ruled out ulcers in that location.  The scope  was withdrawn back to the stomach and retroflexed.  Angularis, cardia,  fundus, lesser and greater curve were evaluated on retroflex visualization.  The hiatal hernia was confirmed in the cardia.  This involved proximal and  midstomach gastritis but no other abnormalities.  Straight visualization in  the stomach confirmed the above and ruled out any additional findings.  The  scope was then advanced to the antrum.  Multiple biopsies of this moderate  antritis were obtained and then a few proximal gastritis biopsies were  obtained to rule out Helicobacter.  Air was suctioned and the scope slowly  withdrawn.  Again a good look at the esophagus on slow withdrawal was  normal.  The scope was removed.  The patient tolerated the procedure well.  There was no obvious immediate complication.   ENDOSCOPIC DIAGNOSES:  1. Small hiatal hernia.  2. Antritis greater than gastritis, status post biopsy.  3. Otherwise within normal limits EGD.    PLAN:  Continue Protonix, since it seems to be helping.  Await pathology and  if H. pylori positive, would go ahead and treat based on her past history of  ulcers as well as chronic nonsteroidal use.  Happy to see back p.r.n. or in  two months to recheck symptoms and guaiacs and decide any other workup and  plans.  Petra Kuba, M.D.    MEM/MEDQ  D:  01/15/2002  T:  01/16/2002  Job:  [id number]   cc:   Otilio Connors. Gerri Spore, M.D.  [address]  [place]  [place] [address]  Fax: [phone]   Lemmie Evens, M.D.  [address]  [place]  [place] [address]  Fax: [phone]

## 2011-04-11 DIAGNOSIS — J0110 Acute frontal sinusitis, unspecified: Secondary | ICD-10-CM | POA: Diagnosis not present

## 2011-04-11 DIAGNOSIS — M069 Rheumatoid arthritis, unspecified: Secondary | ICD-10-CM | POA: Diagnosis not present

## 2011-05-22 DIAGNOSIS — Z1231 Encounter for screening mammogram for malignant neoplasm of breast: Secondary | ICD-10-CM | POA: Diagnosis not present

## 2011-06-10 DIAGNOSIS — M069 Rheumatoid arthritis, unspecified: Secondary | ICD-10-CM | POA: Diagnosis not present

## 2011-06-11 DIAGNOSIS — Z0000 Encounter for general adult medical examination without abnormal findings: Secondary | ICD-10-CM | POA: Diagnosis not present

## 2011-06-11 DIAGNOSIS — IMO0001 Reserved for inherently not codable concepts without codable children: Secondary | ICD-10-CM | POA: Diagnosis not present

## 2011-06-11 DIAGNOSIS — I10 Essential (primary) hypertension: Secondary | ICD-10-CM | POA: Diagnosis not present

## 2011-06-11 DIAGNOSIS — M069 Rheumatoid arthritis, unspecified: Secondary | ICD-10-CM | POA: Diagnosis not present

## 2011-06-11 DIAGNOSIS — E7800 Pure hypercholesterolemia, unspecified: Secondary | ICD-10-CM | POA: Diagnosis not present

## 2011-06-11 DIAGNOSIS — M2550 Pain in unspecified joint: Secondary | ICD-10-CM | POA: Diagnosis not present

## 2011-08-07 DIAGNOSIS — H2510 Age-related nuclear cataract, unspecified eye: Secondary | ICD-10-CM | POA: Diagnosis not present

## 2011-08-13 DIAGNOSIS — M069 Rheumatoid arthritis, unspecified: Secondary | ICD-10-CM | POA: Diagnosis not present

## 2011-08-13 DIAGNOSIS — J0110 Acute frontal sinusitis, unspecified: Secondary | ICD-10-CM | POA: Diagnosis not present

## 2011-10-14 DIAGNOSIS — M069 Rheumatoid arthritis, unspecified: Secondary | ICD-10-CM | POA: Diagnosis not present

## 2011-10-21 DIAGNOSIS — J0110 Acute frontal sinusitis, unspecified: Secondary | ICD-10-CM | POA: Diagnosis not present

## 2011-11-20 DIAGNOSIS — L570 Actinic keratosis: Secondary | ICD-10-CM | POA: Diagnosis not present

## 2011-11-20 DIAGNOSIS — L821 Other seborrheic keratosis: Secondary | ICD-10-CM | POA: Diagnosis not present

## 2011-11-20 DIAGNOSIS — Z85828 Personal history of other malignant neoplasm of skin: Secondary | ICD-10-CM | POA: Diagnosis not present

## 2011-11-20 DIAGNOSIS — D239 Other benign neoplasm of skin, unspecified: Secondary | ICD-10-CM | POA: Diagnosis not present

## 2011-12-04 DIAGNOSIS — J019 Acute sinusitis, unspecified: Secondary | ICD-10-CM | POA: Diagnosis not present

## 2011-12-04 DIAGNOSIS — J209 Acute bronchitis, unspecified: Secondary | ICD-10-CM | POA: Diagnosis not present

## 2011-12-13 DIAGNOSIS — I10 Essential (primary) hypertension: Secondary | ICD-10-CM | POA: Diagnosis not present

## 2011-12-13 DIAGNOSIS — J209 Acute bronchitis, unspecified: Secondary | ICD-10-CM | POA: Diagnosis not present

## 2011-12-13 DIAGNOSIS — Z79899 Other long term (current) drug therapy: Secondary | ICD-10-CM | POA: Diagnosis not present

## 2011-12-13 DIAGNOSIS — Z23 Encounter for immunization: Secondary | ICD-10-CM | POA: Diagnosis not present

## 2011-12-13 DIAGNOSIS — E7800 Pure hypercholesterolemia, unspecified: Secondary | ICD-10-CM | POA: Diagnosis not present

## 2011-12-16 DIAGNOSIS — J329 Chronic sinusitis, unspecified: Secondary | ICD-10-CM | POA: Diagnosis not present

## 2011-12-16 DIAGNOSIS — M069 Rheumatoid arthritis, unspecified: Secondary | ICD-10-CM | POA: Diagnosis not present

## 2012-01-09 DIAGNOSIS — J329 Chronic sinusitis, unspecified: Secondary | ICD-10-CM | POA: Diagnosis not present

## 2012-01-09 DIAGNOSIS — M069 Rheumatoid arthritis, unspecified: Secondary | ICD-10-CM | POA: Diagnosis not present

## 2012-02-15 DIAGNOSIS — N390 Urinary tract infection, site not specified: Secondary | ICD-10-CM | POA: Diagnosis not present

## 2012-02-19 DIAGNOSIS — L570 Actinic keratosis: Secondary | ICD-10-CM | POA: Diagnosis not present

## 2012-02-19 DIAGNOSIS — D2330 Other benign neoplasm of skin of unspecified part of face: Secondary | ICD-10-CM | POA: Diagnosis not present

## 2012-02-19 DIAGNOSIS — D485 Neoplasm of uncertain behavior of skin: Secondary | ICD-10-CM | POA: Diagnosis not present

## 2012-02-20 DIAGNOSIS — K146 Glossodynia: Secondary | ICD-10-CM | POA: Diagnosis not present

## 2012-02-20 DIAGNOSIS — R3989 Other symptoms and signs involving the genitourinary system: Secondary | ICD-10-CM | POA: Diagnosis not present

## 2012-02-20 DIAGNOSIS — Z23 Encounter for immunization: Secondary | ICD-10-CM | POA: Diagnosis not present

## 2012-02-20 DIAGNOSIS — R3129 Other microscopic hematuria: Secondary | ICD-10-CM | POA: Diagnosis not present

## 2012-03-24 DIAGNOSIS — N390 Urinary tract infection, site not specified: Secondary | ICD-10-CM | POA: Diagnosis not present

## 2012-03-24 DIAGNOSIS — R350 Frequency of micturition: Secondary | ICD-10-CM | POA: Diagnosis not present

## 2012-03-24 DIAGNOSIS — R3129 Other microscopic hematuria: Secondary | ICD-10-CM | POA: Diagnosis not present

## 2012-03-30 DIAGNOSIS — H16229 Keratoconjunctivitis sicca, not specified as Sjogren's, unspecified eye: Secondary | ICD-10-CM | POA: Diagnosis not present

## 2012-03-30 DIAGNOSIS — H2510 Age-related nuclear cataract, unspecified eye: Secondary | ICD-10-CM | POA: Diagnosis not present

## 2012-04-14 DIAGNOSIS — H2510 Age-related nuclear cataract, unspecified eye: Secondary | ICD-10-CM | POA: Diagnosis not present

## 2012-04-14 DIAGNOSIS — H269 Unspecified cataract: Secondary | ICD-10-CM | POA: Diagnosis not present

## 2012-04-28 DIAGNOSIS — H269 Unspecified cataract: Secondary | ICD-10-CM | POA: Diagnosis not present

## 2012-04-28 DIAGNOSIS — H2510 Age-related nuclear cataract, unspecified eye: Secondary | ICD-10-CM | POA: Diagnosis not present

## 2012-05-07 DIAGNOSIS — J329 Chronic sinusitis, unspecified: Secondary | ICD-10-CM | POA: Diagnosis not present

## 2012-05-07 DIAGNOSIS — M069 Rheumatoid arthritis, unspecified: Secondary | ICD-10-CM | POA: Diagnosis not present

## 2012-06-12 DIAGNOSIS — E7800 Pure hypercholesterolemia, unspecified: Secondary | ICD-10-CM | POA: Diagnosis not present

## 2012-06-12 DIAGNOSIS — Z0000 Encounter for general adult medical examination without abnormal findings: Secondary | ICD-10-CM | POA: Diagnosis not present

## 2012-06-12 DIAGNOSIS — M949 Disorder of cartilage, unspecified: Secondary | ICD-10-CM | POA: Diagnosis not present

## 2012-06-12 DIAGNOSIS — I10 Essential (primary) hypertension: Secondary | ICD-10-CM | POA: Diagnosis not present

## 2012-06-12 DIAGNOSIS — M069 Rheumatoid arthritis, unspecified: Secondary | ICD-10-CM | POA: Diagnosis not present

## 2012-06-18 DIAGNOSIS — D2370 Other benign neoplasm of skin of unspecified lower limb, including hip: Secondary | ICD-10-CM | POA: Diagnosis not present

## 2012-06-18 DIAGNOSIS — M79609 Pain in unspecified limb: Secondary | ICD-10-CM | POA: Diagnosis not present

## 2012-06-18 DIAGNOSIS — M25579 Pain in unspecified ankle and joints of unspecified foot: Secondary | ICD-10-CM | POA: Diagnosis not present

## 2012-06-18 DIAGNOSIS — M779 Enthesopathy, unspecified: Secondary | ICD-10-CM | POA: Diagnosis not present

## 2012-06-18 DIAGNOSIS — M2010 Hallux valgus (acquired), unspecified foot: Secondary | ICD-10-CM | POA: Diagnosis not present

## 2012-06-25 DIAGNOSIS — M2010 Hallux valgus (acquired), unspecified foot: Secondary | ICD-10-CM | POA: Diagnosis not present

## 2012-06-25 DIAGNOSIS — M779 Enthesopathy, unspecified: Secondary | ICD-10-CM | POA: Diagnosis not present

## 2012-07-07 DIAGNOSIS — M069 Rheumatoid arthritis, unspecified: Secondary | ICD-10-CM | POA: Diagnosis not present

## 2012-07-27 DIAGNOSIS — R350 Frequency of micturition: Secondary | ICD-10-CM | POA: Diagnosis not present

## 2012-07-27 DIAGNOSIS — N390 Urinary tract infection, site not specified: Secondary | ICD-10-CM | POA: Diagnosis not present

## 2012-08-25 DIAGNOSIS — N390 Urinary tract infection, site not specified: Secondary | ICD-10-CM | POA: Diagnosis not present

## 2012-09-02 DIAGNOSIS — H26499 Other secondary cataract, unspecified eye: Secondary | ICD-10-CM | POA: Diagnosis not present

## 2012-09-03 DIAGNOSIS — J329 Chronic sinusitis, unspecified: Secondary | ICD-10-CM | POA: Diagnosis not present

## 2012-09-03 DIAGNOSIS — M069 Rheumatoid arthritis, unspecified: Secondary | ICD-10-CM | POA: Diagnosis not present

## 2012-09-15 DIAGNOSIS — L255 Unspecified contact dermatitis due to plants, except food: Secondary | ICD-10-CM | POA: Diagnosis not present

## 2012-10-27 DIAGNOSIS — Z1231 Encounter for screening mammogram for malignant neoplasm of breast: Secondary | ICD-10-CM | POA: Diagnosis not present

## 2012-10-27 DIAGNOSIS — Z803 Family history of malignant neoplasm of breast: Secondary | ICD-10-CM | POA: Diagnosis not present

## 2012-11-03 DIAGNOSIS — M069 Rheumatoid arthritis, unspecified: Secondary | ICD-10-CM | POA: Diagnosis not present

## 2012-11-23 DIAGNOSIS — D239 Other benign neoplasm of skin, unspecified: Secondary | ICD-10-CM | POA: Diagnosis not present

## 2012-11-23 DIAGNOSIS — Z85828 Personal history of other malignant neoplasm of skin: Secondary | ICD-10-CM | POA: Diagnosis not present

## 2012-11-23 DIAGNOSIS — L570 Actinic keratosis: Secondary | ICD-10-CM | POA: Diagnosis not present

## 2012-11-23 DIAGNOSIS — L821 Other seborrheic keratosis: Secondary | ICD-10-CM | POA: Diagnosis not present

## 2013-01-04 DIAGNOSIS — M069 Rheumatoid arthritis, unspecified: Secondary | ICD-10-CM | POA: Diagnosis not present

## 2013-03-05 DIAGNOSIS — M069 Rheumatoid arthritis, unspecified: Secondary | ICD-10-CM | POA: Diagnosis not present

## 2013-03-05 DIAGNOSIS — I10 Essential (primary) hypertension: Secondary | ICD-10-CM | POA: Diagnosis not present

## 2013-03-05 DIAGNOSIS — E7800 Pure hypercholesterolemia, unspecified: Secondary | ICD-10-CM | POA: Diagnosis not present

## 2013-03-05 DIAGNOSIS — J329 Chronic sinusitis, unspecified: Secondary | ICD-10-CM | POA: Diagnosis not present

## 2013-03-06 ENCOUNTER — Encounter: Payer: Self-pay | Admitting: Emergency Medicine

## 2013-03-06 ENCOUNTER — Emergency Department (INDEPENDENT_AMBULATORY_CARE_PROVIDER_SITE_OTHER)
Admission: EM | Admit: 2013-03-06 | Discharge: 2013-03-06 | Disposition: A | Discharge status: Home / Self Care | Payer: Medicare Other | Source: Home / Self Care | Attending: Family Medicine | Admitting: Family Medicine

## 2013-03-06 DIAGNOSIS — M069 Rheumatoid arthritis, unspecified: Secondary | ICD-10-CM | POA: Insufficient documentation

## 2013-03-06 DIAGNOSIS — E785 Hyperlipidemia, unspecified: Secondary | ICD-10-CM | POA: Insufficient documentation

## 2013-03-06 DIAGNOSIS — L259 Unspecified contact dermatitis, unspecified cause: Secondary | ICD-10-CM

## 2013-03-06 DIAGNOSIS — L239 Allergic contact dermatitis, unspecified cause: Secondary | ICD-10-CM

## 2013-03-06 HISTORY — DX: Hyperlipidemia, unspecified: E78.5

## 2013-03-06 HISTORY — DX: Rheumatoid arthritis, unspecified: M06.9

## 2013-03-06 MED ORDER — TRIAMCINOLONE ACETONIDE 40 MG/ML IJ SUSP
40.0000 mg | Freq: Once | INTRAMUSCULAR | Status: AC
  Administered 2013-03-06: 40 mg via INTRAMUSCULAR

## 2013-03-06 NOTE — ED Provider Notes (Signed)
CSN: [id number]     Arrival date & time 03/06/13  1057 History   First MD Initiated Contact with Patient 03/06/13 1311     Chief Complaint  Patient presents with  . Rash    x 1 day  . Facial Swelling    x 1 day      HPI Comments: Patient reports that she developed sinus congestion about 2 weeks ago, and developed persistent facial pressure.  She visited her PCP who treated her for sinusitis with Augmentin.  At 6pm yesterday she developed a rash and mild swelling of her face.  She feels like she had some mild swelling in her tongue but denies difficulty swallowing, wheezing, shortness of breath, etc.  No rash elsewhere.  She feels well otherwise.  Patient is a 78 y.o. female presenting with rash. The history is provided by the patient.  Rash Pain location: face. Pain quality: pressure   Pain severity:  Mild Onset quality:  Sudden Duration:  1 day Timing:  Constant Progression:  Worsening Chronicity:  New Context comment:  Antibiotic use Relieved by:  Nothing Worsened by:  Nothing tried Ineffective treatments:  None tried Associated symptoms: no chills, no fatigue, no fever, no nausea, no shortness of breath, no sore throat and no vomiting     Past Medical History  Diagnosis Date  . Hyperlipidemia   . Rheumatoid arthritis    Past Surgical History  Procedure Laterality Date  . Cholecystectomy     Family History  Problem Relation Age of Onset  . Cancer Mother     lung  . Cancer Sister     breast   History  Substance Use Topics  . Smoking status: Never Smoker   . Smokeless tobacco: Never Used  . Alcohol Use: No   OB History   Grav Para Term Preterm Abortions TAB SAB Ect Mult Living                 Review of Systems  Constitutional: Negative for fever, chills and fatigue.  HENT: Negative for sore throat.   Respiratory: Negative for shortness of breath.   Gastrointestinal: Negative for nausea and vomiting.  Skin: Positive for rash.  All other systems reviewed  and are negative.    Allergies  Ciprofloxacin and Remicade  Home Medications   Current Outpatient Rx  Name  Route  Sig  Dispense  Refill  . cholecalciferol (VITAMIN D) 1000 UNITS tablet   Oral   Take 1,000 Units by mouth daily.         . cyclobenzaprine (FLEXERIL) 10 MG tablet   Oral   Take 10 mg by mouth 3 (three) times daily as needed for muscle spasms.         Marland Kitchen etanercept (ENBREL) 25 MG injection   Subcutaneous   Inject 25 mg into the skin.         . methotrexate (RHEUMATREX) 5 MG tablet   Oral   Take 5 mg by mouth once a week. Caution: Chemotherapy. Protect from light.         . Omega-3 Fatty Acids (FISH OIL) 1200 MG CAPS   Oral   Take by mouth.         . rosuvastatin (CRESTOR) 20 MG tablet   Oral   Take 10 mg by mouth daily.          BP 125/83  Pulse 84  Temp(Src) 98.1 F (36.7 C) (Oral)  Ht 5\' 4"  (1.626 m)  Wt 157 lb (  71.215 kg)  BMI 26.94 kg/m2  SpO2 97% Physical Exam  Nursing note and vitals reviewed. Constitutional: She is oriented to person, place, and time. She appears well-developed and well-nourished. No distress.  HENT:  Head: Normocephalic.    Right Ear: External ear normal.  Left Ear: External ear normal.  Nose: Nose normal.  Mouth/Throat: Oropharynx is clear and moist. No oropharyngeal exudate.  Cheeks and forehead are lightly erythematous with minimal swelling.  Area not tender to palpation.  Eyes: Conjunctivae and EOM are normal. Pupils are equal, round, and reactive to light. Right eye exhibits no discharge. Left eye exhibits no discharge.  Neck: Normal range of motion. Neck supple.  Cardiovascular: Normal heart sounds.   Pulmonary/Chest: Breath sounds normal.  Abdominal: Bowel sounds are normal.  Lymphadenopathy:    She has no cervical adenopathy.  Neurological: She is alert and oriented to person, place, and time.  Skin: Skin is warm and dry. Rash noted.    ED Course  Procedures none    MDM   1. Allergic  dermatitis    Kenalog 40mg  IM Discontinue Augmentin Also recommend using saline nasal spray several times daily and saline nasal irrigation (AYR is a common brand) Followup with Family Doctor in about 3 days.    Lattie Haw, MD 03/08/13 1003

## 2013-03-06 NOTE — ED Notes (Signed)
Danielle Hebert was seen by her PCP and treated for a sinus infection with Augmentin. She broke out with a rash last night and swelling in face and tongue last night. This is the first time taking Augmentin. Denies shortness of breath.

## 2013-05-12 DIAGNOSIS — M069 Rheumatoid arthritis, unspecified: Secondary | ICD-10-CM | POA: Diagnosis not present

## 2013-05-12 DIAGNOSIS — M79609 Pain in unspecified limb: Secondary | ICD-10-CM | POA: Diagnosis not present

## 2013-06-14 DIAGNOSIS — M899 Disorder of bone, unspecified: Secondary | ICD-10-CM | POA: Diagnosis not present

## 2013-06-14 DIAGNOSIS — N183 Chronic kidney disease, stage 3 unspecified: Secondary | ICD-10-CM | POA: Diagnosis not present

## 2013-06-14 DIAGNOSIS — M949 Disorder of cartilage, unspecified: Secondary | ICD-10-CM | POA: Diagnosis not present

## 2013-06-14 DIAGNOSIS — E7800 Pure hypercholesterolemia, unspecified: Secondary | ICD-10-CM | POA: Diagnosis not present

## 2013-06-14 DIAGNOSIS — I10 Essential (primary) hypertension: Secondary | ICD-10-CM | POA: Diagnosis not present

## 2013-06-14 DIAGNOSIS — M069 Rheumatoid arthritis, unspecified: Secondary | ICD-10-CM | POA: Diagnosis not present

## 2013-06-14 DIAGNOSIS — L259 Unspecified contact dermatitis, unspecified cause: Secondary | ICD-10-CM | POA: Diagnosis not present

## 2013-06-14 DIAGNOSIS — Z23 Encounter for immunization: Secondary | ICD-10-CM | POA: Diagnosis not present

## 2013-06-14 DIAGNOSIS — Z0000 Encounter for general adult medical examination without abnormal findings: Secondary | ICD-10-CM | POA: Diagnosis not present

## 2013-07-12 DIAGNOSIS — M069 Rheumatoid arthritis, unspecified: Secondary | ICD-10-CM | POA: Diagnosis not present

## 2013-09-08 DIAGNOSIS — G4700 Insomnia, unspecified: Secondary | ICD-10-CM | POA: Diagnosis not present

## 2013-09-08 DIAGNOSIS — M069 Rheumatoid arthritis, unspecified: Secondary | ICD-10-CM | POA: Diagnosis not present

## 2013-09-16 DIAGNOSIS — H26499 Other secondary cataract, unspecified eye: Secondary | ICD-10-CM | POA: Diagnosis not present

## 2013-11-04 DIAGNOSIS — H26499 Other secondary cataract, unspecified eye: Secondary | ICD-10-CM | POA: Diagnosis not present

## 2013-11-08 DIAGNOSIS — M069 Rheumatoid arthritis, unspecified: Secondary | ICD-10-CM | POA: Diagnosis not present

## 2013-12-06 DIAGNOSIS — Z85828 Personal history of other malignant neoplasm of skin: Secondary | ICD-10-CM | POA: Diagnosis not present

## 2013-12-06 DIAGNOSIS — L821 Other seborrheic keratosis: Secondary | ICD-10-CM | POA: Diagnosis not present

## 2013-12-06 DIAGNOSIS — D239 Other benign neoplasm of skin, unspecified: Secondary | ICD-10-CM | POA: Diagnosis not present

## 2013-12-15 DIAGNOSIS — I10 Essential (primary) hypertension: Secondary | ICD-10-CM | POA: Diagnosis not present

## 2013-12-15 DIAGNOSIS — Z23 Encounter for immunization: Secondary | ICD-10-CM | POA: Diagnosis not present

## 2013-12-15 DIAGNOSIS — M069 Rheumatoid arthritis, unspecified: Secondary | ICD-10-CM | POA: Diagnosis not present

## 2013-12-15 DIAGNOSIS — E785 Hyperlipidemia, unspecified: Secondary | ICD-10-CM | POA: Diagnosis not present

## 2014-01-06 DIAGNOSIS — M8580 Other specified disorders of bone density and structure, unspecified site: Secondary | ICD-10-CM | POA: Diagnosis not present

## 2014-01-06 DIAGNOSIS — M0589 Other rheumatoid arthritis with rheumatoid factor of multiple sites: Secondary | ICD-10-CM | POA: Diagnosis not present

## 2014-01-06 DIAGNOSIS — M150 Primary generalized (osteo)arthritis: Secondary | ICD-10-CM | POA: Diagnosis not present

## 2014-03-08 DIAGNOSIS — M0589 Other rheumatoid arthritis with rheumatoid factor of multiple sites: Secondary | ICD-10-CM | POA: Diagnosis not present

## 2014-03-24 DIAGNOSIS — M0589 Other rheumatoid arthritis with rheumatoid factor of multiple sites: Secondary | ICD-10-CM | POA: Diagnosis not present

## 2014-03-24 DIAGNOSIS — M8580 Other specified disorders of bone density and structure, unspecified site: Secondary | ICD-10-CM | POA: Diagnosis not present

## 2014-03-24 DIAGNOSIS — M150 Primary generalized (osteo)arthritis: Secondary | ICD-10-CM | POA: Diagnosis not present

## 2014-04-18 DIAGNOSIS — M0589 Other rheumatoid arthritis with rheumatoid factor of multiple sites: Secondary | ICD-10-CM | POA: Diagnosis not present

## 2014-05-16 DIAGNOSIS — M0589 Other rheumatoid arthritis with rheumatoid factor of multiple sites: Secondary | ICD-10-CM | POA: Diagnosis not present

## 2014-05-23 DIAGNOSIS — M150 Primary generalized (osteo)arthritis: Secondary | ICD-10-CM | POA: Diagnosis not present

## 2014-05-23 DIAGNOSIS — M0589 Other rheumatoid arthritis with rheumatoid factor of multiple sites: Secondary | ICD-10-CM | POA: Diagnosis not present

## 2014-05-23 DIAGNOSIS — M8580 Other specified disorders of bone density and structure, unspecified site: Secondary | ICD-10-CM | POA: Diagnosis not present

## 2014-06-22 DIAGNOSIS — E785 Hyperlipidemia, unspecified: Secondary | ICD-10-CM | POA: Diagnosis not present

## 2014-06-22 DIAGNOSIS — I10 Essential (primary) hypertension: Secondary | ICD-10-CM | POA: Diagnosis not present

## 2014-06-22 DIAGNOSIS — M899 Disorder of bone, unspecified: Secondary | ICD-10-CM | POA: Diagnosis not present

## 2014-06-22 DIAGNOSIS — R739 Hyperglycemia, unspecified: Secondary | ICD-10-CM | POA: Diagnosis not present

## 2014-06-22 DIAGNOSIS — Z0000 Encounter for general adult medical examination without abnormal findings: Secondary | ICD-10-CM | POA: Diagnosis not present

## 2014-07-11 DIAGNOSIS — M0589 Other rheumatoid arthritis with rheumatoid factor of multiple sites: Secondary | ICD-10-CM | POA: Diagnosis not present

## 2014-08-22 DIAGNOSIS — L259 Unspecified contact dermatitis, unspecified cause: Secondary | ICD-10-CM | POA: Diagnosis not present

## 2014-08-23 DIAGNOSIS — M150 Primary generalized (osteo)arthritis: Secondary | ICD-10-CM | POA: Diagnosis not present

## 2014-08-23 DIAGNOSIS — L237 Allergic contact dermatitis due to plants, except food: Secondary | ICD-10-CM | POA: Diagnosis not present

## 2014-08-23 DIAGNOSIS — M8580 Other specified disorders of bone density and structure, unspecified site: Secondary | ICD-10-CM | POA: Diagnosis not present

## 2014-08-23 DIAGNOSIS — M0589 Other rheumatoid arthritis with rheumatoid factor of multiple sites: Secondary | ICD-10-CM | POA: Diagnosis not present

## 2014-09-02 DIAGNOSIS — L259 Unspecified contact dermatitis, unspecified cause: Secondary | ICD-10-CM | POA: Diagnosis not present

## 2014-09-05 DIAGNOSIS — M0589 Other rheumatoid arthritis with rheumatoid factor of multiple sites: Secondary | ICD-10-CM | POA: Diagnosis not present

## 2014-09-18 DIAGNOSIS — L237 Allergic contact dermatitis due to plants, except food: Secondary | ICD-10-CM | POA: Diagnosis not present

## 2014-10-17 DIAGNOSIS — F4320 Adjustment disorder, unspecified: Secondary | ICD-10-CM | POA: Diagnosis not present

## 2014-10-17 DIAGNOSIS — J329 Chronic sinusitis, unspecified: Secondary | ICD-10-CM | POA: Diagnosis not present

## 2014-10-17 DIAGNOSIS — G44209 Tension-type headache, unspecified, not intractable: Secondary | ICD-10-CM | POA: Diagnosis not present

## 2014-11-02 DIAGNOSIS — R05 Cough: Secondary | ICD-10-CM | POA: Diagnosis not present

## 2014-11-15 DIAGNOSIS — M0589 Other rheumatoid arthritis with rheumatoid factor of multiple sites: Secondary | ICD-10-CM | POA: Diagnosis not present

## 2014-11-29 DIAGNOSIS — M8580 Other specified disorders of bone density and structure, unspecified site: Secondary | ICD-10-CM | POA: Diagnosis not present

## 2014-11-29 DIAGNOSIS — M0589 Other rheumatoid arthritis with rheumatoid factor of multiple sites: Secondary | ICD-10-CM | POA: Diagnosis not present

## 2014-11-29 DIAGNOSIS — M150 Primary generalized (osteo)arthritis: Secondary | ICD-10-CM | POA: Diagnosis not present

## 2014-11-29 DIAGNOSIS — N183 Chronic kidney disease, stage 3 (moderate): Secondary | ICD-10-CM | POA: Diagnosis not present

## 2014-11-30 DIAGNOSIS — H04123 Dry eye syndrome of bilateral lacrimal glands: Secondary | ICD-10-CM | POA: Diagnosis not present

## 2014-11-30 DIAGNOSIS — Z961 Presence of intraocular lens: Secondary | ICD-10-CM | POA: Diagnosis not present

## 2014-12-22 DIAGNOSIS — E785 Hyperlipidemia, unspecified: Secondary | ICD-10-CM | POA: Diagnosis not present

## 2014-12-22 DIAGNOSIS — R7309 Other abnormal glucose: Secondary | ICD-10-CM | POA: Diagnosis not present

## 2014-12-22 DIAGNOSIS — I10 Essential (primary) hypertension: Secondary | ICD-10-CM | POA: Diagnosis not present

## 2014-12-22 DIAGNOSIS — Z23 Encounter for immunization: Secondary | ICD-10-CM | POA: Diagnosis not present

## 2014-12-29 DIAGNOSIS — D225 Melanocytic nevi of trunk: Secondary | ICD-10-CM | POA: Diagnosis not present

## 2014-12-29 DIAGNOSIS — D485 Neoplasm of uncertain behavior of skin: Secondary | ICD-10-CM | POA: Diagnosis not present

## 2014-12-29 DIAGNOSIS — Z85828 Personal history of other malignant neoplasm of skin: Secondary | ICD-10-CM | POA: Diagnosis not present

## 2014-12-29 DIAGNOSIS — L821 Other seborrheic keratosis: Secondary | ICD-10-CM | POA: Diagnosis not present

## 2014-12-29 DIAGNOSIS — Z86018 Personal history of other benign neoplasm: Secondary | ICD-10-CM | POA: Diagnosis not present

## 2015-01-10 DIAGNOSIS — M0589 Other rheumatoid arthritis with rheumatoid factor of multiple sites: Secondary | ICD-10-CM | POA: Diagnosis not present

## 2015-01-23 DIAGNOSIS — L905 Scar conditions and fibrosis of skin: Secondary | ICD-10-CM | POA: Diagnosis not present

## 2015-01-23 DIAGNOSIS — D485 Neoplasm of uncertain behavior of skin: Secondary | ICD-10-CM | POA: Diagnosis not present

## 2015-02-28 DIAGNOSIS — M25551 Pain in right hip: Secondary | ICD-10-CM | POA: Diagnosis not present

## 2015-02-28 DIAGNOSIS — N183 Chronic kidney disease, stage 3 (moderate): Secondary | ICD-10-CM | POA: Diagnosis not present

## 2015-02-28 DIAGNOSIS — M150 Primary generalized (osteo)arthritis: Secondary | ICD-10-CM | POA: Diagnosis not present

## 2015-02-28 DIAGNOSIS — M8580 Other specified disorders of bone density and structure, unspecified site: Secondary | ICD-10-CM | POA: Diagnosis not present

## 2015-02-28 DIAGNOSIS — M0589 Other rheumatoid arthritis with rheumatoid factor of multiple sites: Secondary | ICD-10-CM | POA: Diagnosis not present

## 2015-03-07 DIAGNOSIS — M0589 Other rheumatoid arthritis with rheumatoid factor of multiple sites: Secondary | ICD-10-CM | POA: Diagnosis not present

## 2015-04-19 DIAGNOSIS — B370 Candidal stomatitis: Secondary | ICD-10-CM | POA: Diagnosis not present

## 2015-05-26 DIAGNOSIS — M0589 Other rheumatoid arthritis with rheumatoid factor of multiple sites: Secondary | ICD-10-CM | POA: Diagnosis not present

## 2015-05-26 DIAGNOSIS — Z79899 Other long term (current) drug therapy: Secondary | ICD-10-CM | POA: Diagnosis not present

## 2015-05-29 DIAGNOSIS — M150 Primary generalized (osteo)arthritis: Secondary | ICD-10-CM | POA: Diagnosis not present

## 2015-05-29 DIAGNOSIS — M8580 Other specified disorders of bone density and structure, unspecified site: Secondary | ICD-10-CM | POA: Diagnosis not present

## 2015-05-29 DIAGNOSIS — M0589 Other rheumatoid arthritis with rheumatoid factor of multiple sites: Secondary | ICD-10-CM | POA: Diagnosis not present

## 2015-05-29 DIAGNOSIS — N183 Chronic kidney disease, stage 3 (moderate): Secondary | ICD-10-CM | POA: Diagnosis not present

## 2015-05-29 DIAGNOSIS — M25551 Pain in right hip: Secondary | ICD-10-CM | POA: Diagnosis not present

## 2015-06-27 DIAGNOSIS — E785 Hyperlipidemia, unspecified: Secondary | ICD-10-CM | POA: Diagnosis not present

## 2015-06-27 DIAGNOSIS — Z1389 Encounter for screening for other disorder: Secondary | ICD-10-CM | POA: Diagnosis not present

## 2015-06-27 DIAGNOSIS — R7301 Impaired fasting glucose: Secondary | ICD-10-CM | POA: Diagnosis not present

## 2015-06-27 DIAGNOSIS — Z0000 Encounter for general adult medical examination without abnormal findings: Secondary | ICD-10-CM | POA: Diagnosis not present

## 2015-07-21 DIAGNOSIS — M0589 Other rheumatoid arthritis with rheumatoid factor of multiple sites: Secondary | ICD-10-CM | POA: Diagnosis not present

## 2015-09-05 DIAGNOSIS — R002 Palpitations: Secondary | ICD-10-CM | POA: Diagnosis not present

## 2015-09-13 DIAGNOSIS — L570 Actinic keratosis: Secondary | ICD-10-CM | POA: Diagnosis not present

## 2015-09-13 DIAGNOSIS — L814 Other melanin hyperpigmentation: Secondary | ICD-10-CM | POA: Diagnosis not present

## 2015-09-13 DIAGNOSIS — D485 Neoplasm of uncertain behavior of skin: Secondary | ICD-10-CM | POA: Diagnosis not present

## 2015-09-15 DIAGNOSIS — M0589 Other rheumatoid arthritis with rheumatoid factor of multiple sites: Secondary | ICD-10-CM | POA: Diagnosis not present

## 2015-09-15 DIAGNOSIS — Z79899 Other long term (current) drug therapy: Secondary | ICD-10-CM | POA: Diagnosis not present

## 2015-09-27 DIAGNOSIS — M25551 Pain in right hip: Secondary | ICD-10-CM | POA: Diagnosis not present

## 2015-09-27 DIAGNOSIS — M0589 Other rheumatoid arthritis with rheumatoid factor of multiple sites: Secondary | ICD-10-CM | POA: Diagnosis not present

## 2015-09-27 DIAGNOSIS — N183 Chronic kidney disease, stage 3 (moderate): Secondary | ICD-10-CM | POA: Diagnosis not present

## 2015-09-27 DIAGNOSIS — M8580 Other specified disorders of bone density and structure, unspecified site: Secondary | ICD-10-CM | POA: Diagnosis not present

## 2015-09-27 DIAGNOSIS — M150 Primary generalized (osteo)arthritis: Secondary | ICD-10-CM | POA: Diagnosis not present

## 2015-10-10 DIAGNOSIS — I10 Essential (primary) hypertension: Secondary | ICD-10-CM | POA: Diagnosis not present

## 2015-10-10 DIAGNOSIS — M542 Cervicalgia: Secondary | ICD-10-CM | POA: Diagnosis not present

## 2015-10-11 ENCOUNTER — Other Ambulatory Visit: Payer: Self-pay | Admitting: Family Medicine

## 2015-10-11 DIAGNOSIS — M542 Cervicalgia: Secondary | ICD-10-CM

## 2015-10-20 ENCOUNTER — Ambulatory Visit
Admission: RE | Admit: 2015-10-20 | Discharge: 2015-10-20 | Disposition: A | Discharge status: Home / Self Care | Payer: Medicare Other | Source: Ambulatory Visit | Attending: Family Medicine | Admitting: Family Medicine

## 2015-10-20 DIAGNOSIS — M542 Cervicalgia: Secondary | ICD-10-CM

## 2015-10-20 DIAGNOSIS — I6523 Occlusion and stenosis of bilateral carotid arteries: Secondary | ICD-10-CM | POA: Diagnosis not present

## 2015-10-20 IMAGING — US US CAROTID DUPLEX BILAT
1 series · 13 of 24 positions shown · non-contrast
Comparison: None.

CLINICAL DATA: Intermittent pulse stations, dizziness and neck
pain.

EXAM:
BILATERAL CAROTID DUPLEX ULTRASOUND
TECHNIQUE: Gray scale imaging, color Doppler and duplex ultrasound were
performed of bilateral carotid and vertebral arteries in the neck.

[Series 1: us carotid duplex bilat · 0.08mm/px · 13 of 84 slices shown]
[im 1/84]
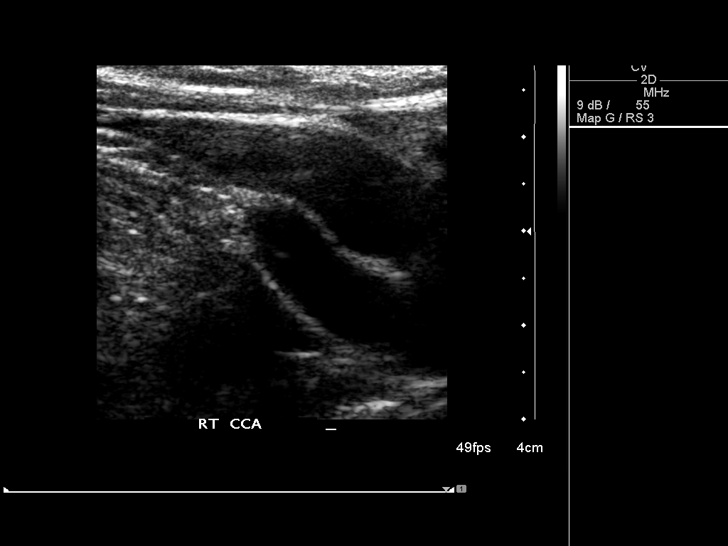
[im 8/84]
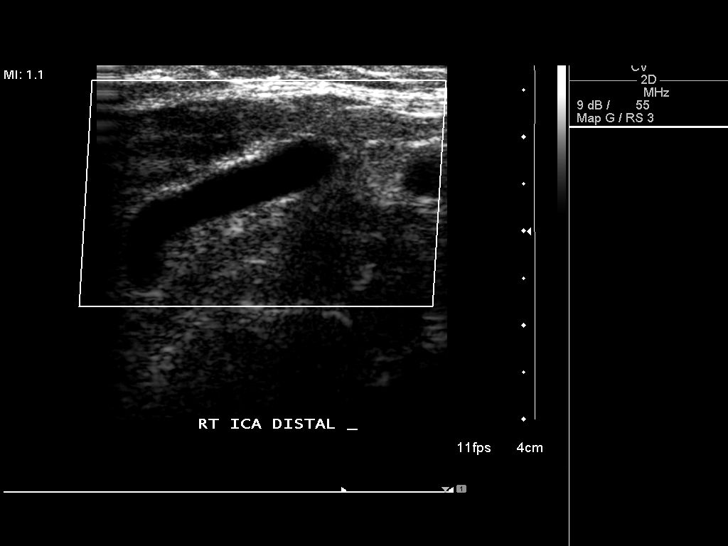
[im 15/84]
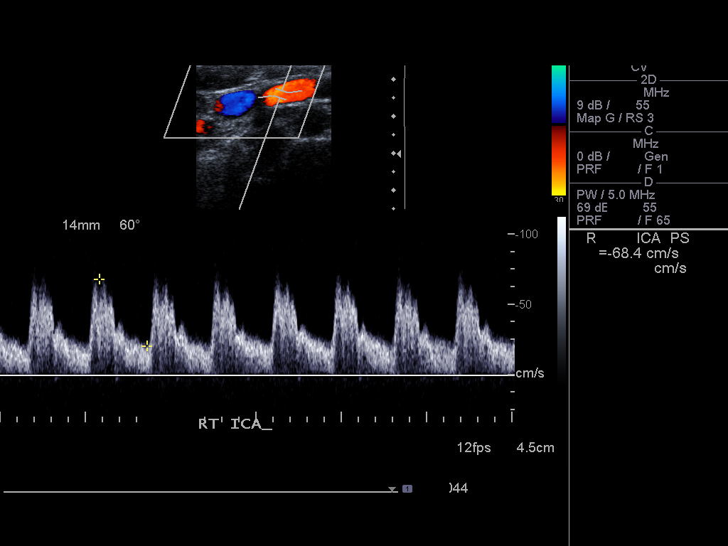
[im 22/84]
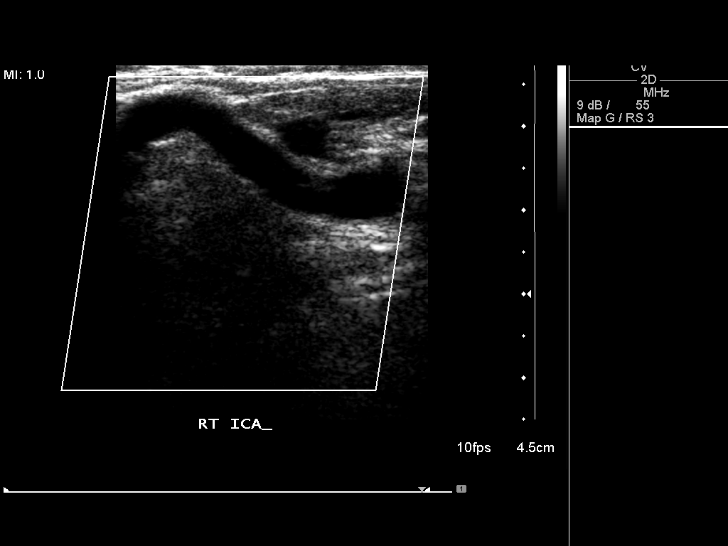
[im 29/84]
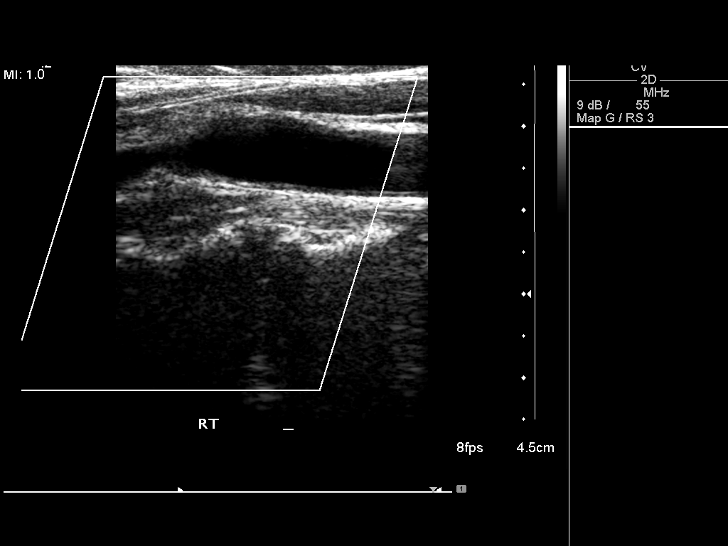
[im 37/84]
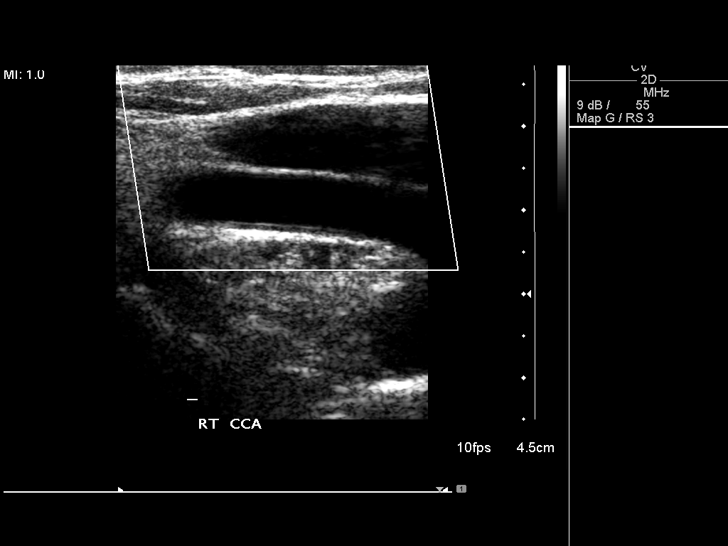
[im 44/84]
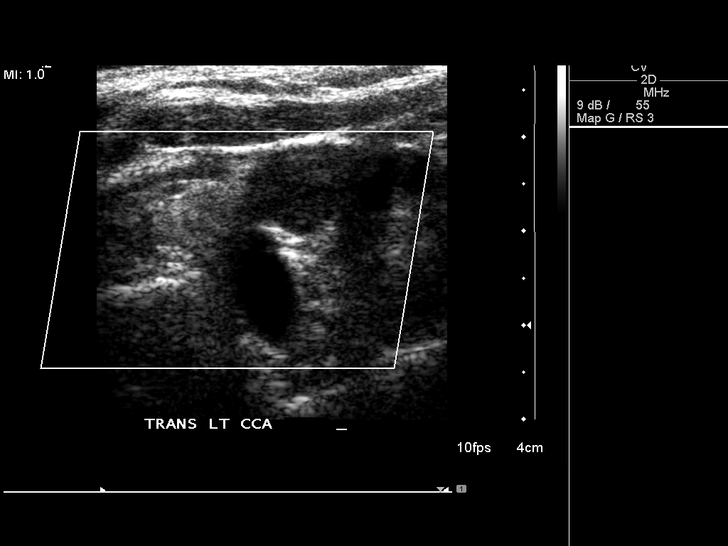
[im 47/84]
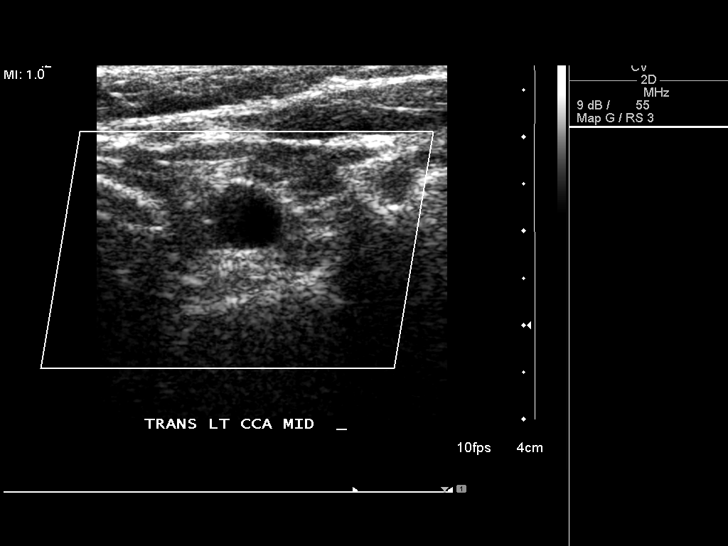
[im 55/84]
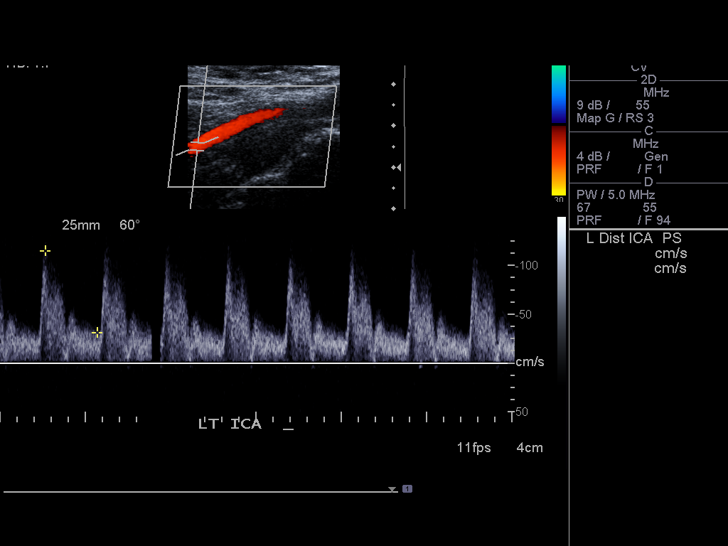
[im 62/84]
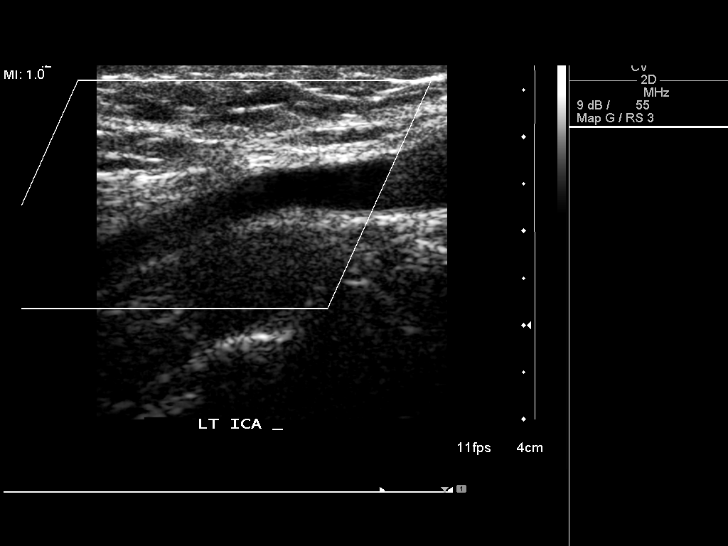
[im 69/84]
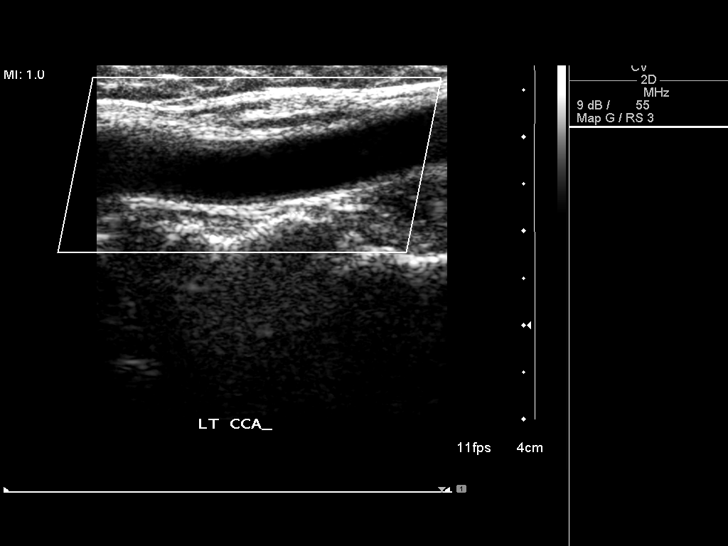
[im 76/84]
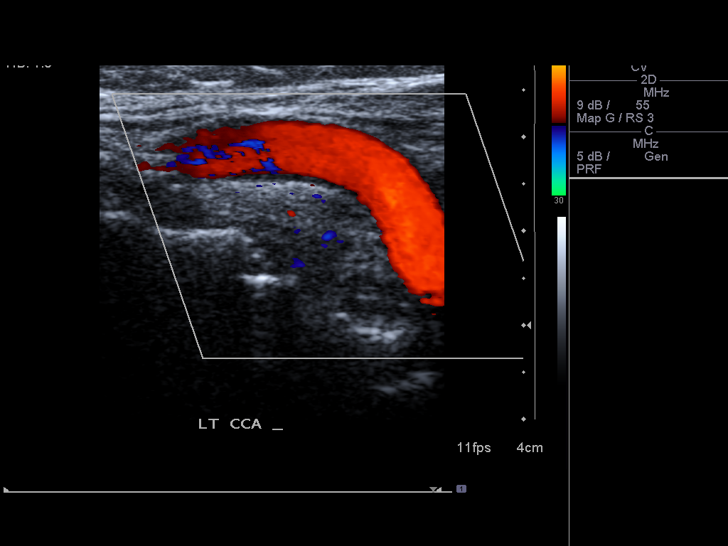
[im 84/84]
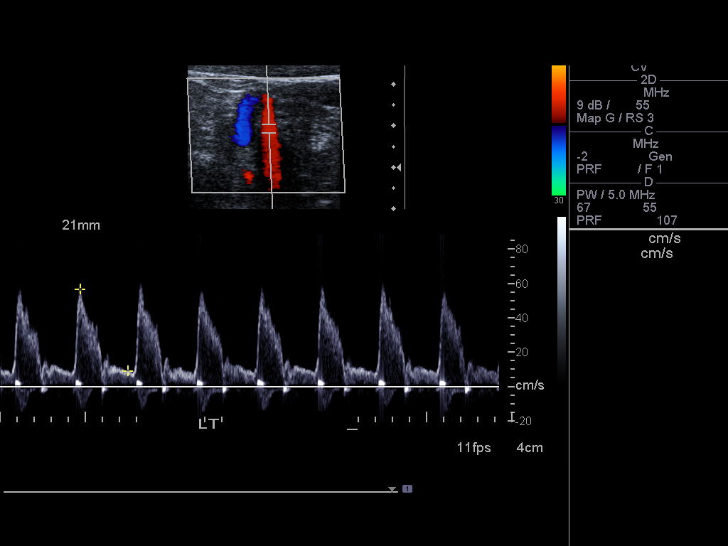

[13 of 24 positions shown; findings below may reference images not displayed]

FINDINGS: Criteria: Quantification of carotid stenosis is based on velocity
parameters that correlate the residual internal carotid diameter
with NASCET-based stenosis levels, using the diameter of the distal
internal carotid lumen as the denominator for stenosis measurement.

The following velocity measurements were obtained:

RIGHT

ICA:  98/27 cm/sec

CCA:  103/15 cm/sec

SYSTOLIC ICA/CCA RATIO:

DIASTOLIC ICA/CCA RATIO:

ECA:  72 cm/sec

LEFT

ICA:  116/32 cm/sec

CCA:  83/13 cm/sec

SYSTOLIC ICA/CCA RATIO:

DIASTOLIC ICA/CCA RATIO:

ECA:  84 cm/sec

RIGHT CAROTID ARTERY: There is no grayscale evidence significant
intimal wall thickening or atherosclerotic plaque affecting the
interrogated portions of the right carotid system. There are no
elevated peak systolic velocities within the interrogated course of
the right internal carotid artery to suggest a hemodynamically
significant stenosis. Note is made of tortuosity of the right
internal carotid artery (image 13).

RIGHT VERTEBRAL ARTERY:  Antegrade Flow

LEFT CAROTID ARTERY: There is no grayscale evidence of significant
intimal thickening or atherosclerotic plaque affecting the
interrogated portions of the left carotid system. There are no
elevated peak systolic velocities within the interrogated course of
the left internal carotid artery to suggest a hemodynamically
significant stenosis.

LEFT VERTEBRAL ARTERY: Antegrade Flow - the left vertebral artery is
noted to be tortuous proximally (image 84)
IMPRESSION: Tortuous right internal carotid and left vertebral arteries.
Otherwise, normal carotid Doppler ultrasound.

## 2015-11-10 DIAGNOSIS — Z79899 Other long term (current) drug therapy: Secondary | ICD-10-CM | POA: Diagnosis not present

## 2015-11-10 DIAGNOSIS — M0589 Other rheumatoid arthritis with rheumatoid factor of multiple sites: Secondary | ICD-10-CM | POA: Diagnosis not present

## 2015-12-06 DIAGNOSIS — H524 Presbyopia: Secondary | ICD-10-CM | POA: Diagnosis not present

## 2015-12-06 DIAGNOSIS — H04123 Dry eye syndrome of bilateral lacrimal glands: Secondary | ICD-10-CM | POA: Diagnosis not present

## 2015-12-06 DIAGNOSIS — H5213 Myopia, bilateral: Secondary | ICD-10-CM | POA: Diagnosis not present

## 2015-12-06 DIAGNOSIS — H52223 Regular astigmatism, bilateral: Secondary | ICD-10-CM | POA: Diagnosis not present

## 2015-12-06 DIAGNOSIS — H35033 Hypertensive retinopathy, bilateral: Secondary | ICD-10-CM | POA: Diagnosis not present

## 2015-12-20 DIAGNOSIS — N3000 Acute cystitis without hematuria: Secondary | ICD-10-CM | POA: Diagnosis not present

## 2015-12-20 DIAGNOSIS — Z803 Family history of malignant neoplasm of breast: Secondary | ICD-10-CM | POA: Diagnosis not present

## 2015-12-20 DIAGNOSIS — Z1231 Encounter for screening mammogram for malignant neoplasm of breast: Secondary | ICD-10-CM | POA: Diagnosis not present

## 2015-12-22 DIAGNOSIS — Z23 Encounter for immunization: Secondary | ICD-10-CM | POA: Diagnosis not present

## 2016-01-05 DIAGNOSIS — Z79899 Other long term (current) drug therapy: Secondary | ICD-10-CM | POA: Diagnosis not present

## 2016-01-05 DIAGNOSIS — M0589 Other rheumatoid arthritis with rheumatoid factor of multiple sites: Secondary | ICD-10-CM | POA: Diagnosis not present

## 2016-01-10 DIAGNOSIS — D1801 Hemangioma of skin and subcutaneous tissue: Secondary | ICD-10-CM | POA: Diagnosis not present

## 2016-01-10 DIAGNOSIS — L821 Other seborrheic keratosis: Secondary | ICD-10-CM | POA: Diagnosis not present

## 2016-01-10 DIAGNOSIS — Z85828 Personal history of other malignant neoplasm of skin: Secondary | ICD-10-CM | POA: Diagnosis not present

## 2016-01-10 DIAGNOSIS — L814 Other melanin hyperpigmentation: Secondary | ICD-10-CM | POA: Diagnosis not present

## 2016-01-10 DIAGNOSIS — Z86018 Personal history of other benign neoplasm: Secondary | ICD-10-CM | POA: Diagnosis not present

## 2016-01-10 DIAGNOSIS — D225 Melanocytic nevi of trunk: Secondary | ICD-10-CM | POA: Diagnosis not present

## 2016-01-10 DIAGNOSIS — Z23 Encounter for immunization: Secondary | ICD-10-CM | POA: Diagnosis not present

## 2016-01-29 DIAGNOSIS — N183 Chronic kidney disease, stage 3 (moderate): Secondary | ICD-10-CM | POA: Diagnosis not present

## 2016-01-29 DIAGNOSIS — M150 Primary generalized (osteo)arthritis: Secondary | ICD-10-CM | POA: Diagnosis not present

## 2016-01-29 DIAGNOSIS — M8580 Other specified disorders of bone density and structure, unspecified site: Secondary | ICD-10-CM | POA: Diagnosis not present

## 2016-01-29 DIAGNOSIS — M25551 Pain in right hip: Secondary | ICD-10-CM | POA: Diagnosis not present

## 2016-01-29 DIAGNOSIS — M0589 Other rheumatoid arthritis with rheumatoid factor of multiple sites: Secondary | ICD-10-CM | POA: Diagnosis not present

## 2016-03-01 DIAGNOSIS — Z79899 Other long term (current) drug therapy: Secondary | ICD-10-CM | POA: Diagnosis not present

## 2016-03-01 DIAGNOSIS — M0589 Other rheumatoid arthritis with rheumatoid factor of multiple sites: Secondary | ICD-10-CM | POA: Diagnosis not present

## 2016-04-26 DIAGNOSIS — M0589 Other rheumatoid arthritis with rheumatoid factor of multiple sites: Secondary | ICD-10-CM | POA: Diagnosis not present

## 2016-04-26 DIAGNOSIS — Z79899 Other long term (current) drug therapy: Secondary | ICD-10-CM | POA: Diagnosis not present

## 2016-05-28 DIAGNOSIS — E663 Overweight: Secondary | ICD-10-CM | POA: Diagnosis not present

## 2016-05-28 DIAGNOSIS — M150 Primary generalized (osteo)arthritis: Secondary | ICD-10-CM | POA: Diagnosis not present

## 2016-05-28 DIAGNOSIS — N183 Chronic kidney disease, stage 3 (moderate): Secondary | ICD-10-CM | POA: Diagnosis not present

## 2016-05-28 DIAGNOSIS — M8580 Other specified disorders of bone density and structure, unspecified site: Secondary | ICD-10-CM | POA: Diagnosis not present

## 2016-05-28 DIAGNOSIS — Z6828 Body mass index (BMI) 28.0-28.9, adult: Secondary | ICD-10-CM | POA: Diagnosis not present

## 2016-05-28 DIAGNOSIS — M25551 Pain in right hip: Secondary | ICD-10-CM | POA: Diagnosis not present

## 2016-05-28 DIAGNOSIS — M0589 Other rheumatoid arthritis with rheumatoid factor of multiple sites: Secondary | ICD-10-CM | POA: Diagnosis not present

## 2016-06-21 DIAGNOSIS — M0589 Other rheumatoid arthritis with rheumatoid factor of multiple sites: Secondary | ICD-10-CM | POA: Diagnosis not present

## 2016-06-21 DIAGNOSIS — Z79899 Other long term (current) drug therapy: Secondary | ICD-10-CM | POA: Diagnosis not present

## 2016-08-16 DIAGNOSIS — M0589 Other rheumatoid arthritis with rheumatoid factor of multiple sites: Secondary | ICD-10-CM | POA: Diagnosis not present

## 2016-08-16 DIAGNOSIS — Z79899 Other long term (current) drug therapy: Secondary | ICD-10-CM | POA: Diagnosis not present

## 2016-09-24 DIAGNOSIS — N390 Urinary tract infection, site not specified: Secondary | ICD-10-CM | POA: Diagnosis not present

## 2016-10-14 DIAGNOSIS — M0589 Other rheumatoid arthritis with rheumatoid factor of multiple sites: Secondary | ICD-10-CM | POA: Diagnosis not present

## 2016-10-14 DIAGNOSIS — Z79899 Other long term (current) drug therapy: Secondary | ICD-10-CM | POA: Diagnosis not present

## 2016-11-07 DIAGNOSIS — H01001 Unspecified blepharitis right upper eyelid: Secondary | ICD-10-CM | POA: Diagnosis not present

## 2016-11-07 DIAGNOSIS — H04123 Dry eye syndrome of bilateral lacrimal glands: Secondary | ICD-10-CM | POA: Diagnosis not present

## 2016-11-07 DIAGNOSIS — H35033 Hypertensive retinopathy, bilateral: Secondary | ICD-10-CM | POA: Diagnosis not present

## 2016-11-07 DIAGNOSIS — H524 Presbyopia: Secondary | ICD-10-CM | POA: Diagnosis not present

## 2016-11-07 DIAGNOSIS — Z961 Presence of intraocular lens: Secondary | ICD-10-CM | POA: Diagnosis not present

## 2016-11-14 DIAGNOSIS — N183 Chronic kidney disease, stage 3 (moderate): Secondary | ICD-10-CM | POA: Diagnosis not present

## 2016-11-14 DIAGNOSIS — M8580 Other specified disorders of bone density and structure, unspecified site: Secondary | ICD-10-CM | POA: Diagnosis not present

## 2016-11-14 DIAGNOSIS — E663 Overweight: Secondary | ICD-10-CM | POA: Diagnosis not present

## 2016-11-14 DIAGNOSIS — M25551 Pain in right hip: Secondary | ICD-10-CM | POA: Diagnosis not present

## 2016-11-14 DIAGNOSIS — Z6827 Body mass index (BMI) 27.0-27.9, adult: Secondary | ICD-10-CM | POA: Diagnosis not present

## 2016-11-14 DIAGNOSIS — M150 Primary generalized (osteo)arthritis: Secondary | ICD-10-CM | POA: Diagnosis not present

## 2016-11-14 DIAGNOSIS — M0589 Other rheumatoid arthritis with rheumatoid factor of multiple sites: Secondary | ICD-10-CM | POA: Diagnosis not present

## 2016-12-09 DIAGNOSIS — M0589 Other rheumatoid arthritis with rheumatoid factor of multiple sites: Secondary | ICD-10-CM | POA: Diagnosis not present

## 2017-01-09 DIAGNOSIS — Z23 Encounter for immunization: Secondary | ICD-10-CM | POA: Diagnosis not present

## 2017-01-16 DIAGNOSIS — L723 Sebaceous cyst: Secondary | ICD-10-CM | POA: Diagnosis not present

## 2017-01-16 DIAGNOSIS — Z23 Encounter for immunization: Secondary | ICD-10-CM | POA: Diagnosis not present

## 2017-01-16 DIAGNOSIS — L814 Other melanin hyperpigmentation: Secondary | ICD-10-CM | POA: Diagnosis not present

## 2017-01-16 DIAGNOSIS — L719 Rosacea, unspecified: Secondary | ICD-10-CM | POA: Diagnosis not present

## 2017-01-16 DIAGNOSIS — D225 Melanocytic nevi of trunk: Secondary | ICD-10-CM | POA: Diagnosis not present

## 2017-01-16 DIAGNOSIS — Z85828 Personal history of other malignant neoplasm of skin: Secondary | ICD-10-CM | POA: Diagnosis not present

## 2017-01-16 DIAGNOSIS — L821 Other seborrheic keratosis: Secondary | ICD-10-CM | POA: Diagnosis not present

## 2017-01-16 DIAGNOSIS — D1801 Hemangioma of skin and subcutaneous tissue: Secondary | ICD-10-CM | POA: Diagnosis not present

## 2017-01-16 DIAGNOSIS — Z86018 Personal history of other benign neoplasm: Secondary | ICD-10-CM | POA: Diagnosis not present

## 2017-01-22 DIAGNOSIS — H01001 Unspecified blepharitis right upper eyelid: Secondary | ICD-10-CM | POA: Diagnosis not present

## 2017-01-22 DIAGNOSIS — H40053 Ocular hypertension, bilateral: Secondary | ICD-10-CM | POA: Diagnosis not present

## 2017-01-22 DIAGNOSIS — Z961 Presence of intraocular lens: Secondary | ICD-10-CM | POA: Diagnosis not present

## 2017-01-22 DIAGNOSIS — H01002 Unspecified blepharitis right lower eyelid: Secondary | ICD-10-CM | POA: Diagnosis not present

## 2017-01-22 DIAGNOSIS — H04123 Dry eye syndrome of bilateral lacrimal glands: Secondary | ICD-10-CM | POA: Diagnosis not present

## 2017-02-04 DIAGNOSIS — M0589 Other rheumatoid arthritis with rheumatoid factor of multiple sites: Secondary | ICD-10-CM | POA: Diagnosis not present

## 2017-02-04 DIAGNOSIS — Z79899 Other long term (current) drug therapy: Secondary | ICD-10-CM | POA: Diagnosis not present

## 2017-03-19 DIAGNOSIS — L821 Other seborrheic keratosis: Secondary | ICD-10-CM | POA: Diagnosis not present

## 2017-03-19 DIAGNOSIS — Z23 Encounter for immunization: Secondary | ICD-10-CM | POA: Diagnosis not present

## 2017-03-19 DIAGNOSIS — L723 Sebaceous cyst: Secondary | ICD-10-CM | POA: Diagnosis not present

## 2017-03-19 DIAGNOSIS — L570 Actinic keratosis: Secondary | ICD-10-CM | POA: Diagnosis not present

## 2017-03-27 DIAGNOSIS — M150 Primary generalized (osteo)arthritis: Secondary | ICD-10-CM | POA: Diagnosis not present

## 2017-03-27 DIAGNOSIS — E663 Overweight: Secondary | ICD-10-CM | POA: Diagnosis not present

## 2017-03-27 DIAGNOSIS — Z6828 Body mass index (BMI) 28.0-28.9, adult: Secondary | ICD-10-CM | POA: Diagnosis not present

## 2017-03-27 DIAGNOSIS — L02211 Cutaneous abscess of abdominal wall: Secondary | ICD-10-CM | POA: Diagnosis not present

## 2017-03-27 DIAGNOSIS — M0589 Other rheumatoid arthritis with rheumatoid factor of multiple sites: Secondary | ICD-10-CM | POA: Diagnosis not present

## 2017-03-27 DIAGNOSIS — N183 Chronic kidney disease, stage 3 (moderate): Secondary | ICD-10-CM | POA: Diagnosis not present

## 2017-03-27 DIAGNOSIS — M8580 Other specified disorders of bone density and structure, unspecified site: Secondary | ICD-10-CM | POA: Diagnosis not present

## 2017-03-27 DIAGNOSIS — M25551 Pain in right hip: Secondary | ICD-10-CM | POA: Diagnosis not present

## 2017-04-08 DIAGNOSIS — Z79899 Other long term (current) drug therapy: Secondary | ICD-10-CM | POA: Diagnosis not present

## 2017-04-08 DIAGNOSIS — M0589 Other rheumatoid arthritis with rheumatoid factor of multiple sites: Secondary | ICD-10-CM | POA: Diagnosis not present

## 2017-04-22 DIAGNOSIS — R7303 Prediabetes: Secondary | ICD-10-CM | POA: Diagnosis not present

## 2017-04-22 DIAGNOSIS — Z0000 Encounter for general adult medical examination without abnormal findings: Secondary | ICD-10-CM | POA: Diagnosis not present

## 2017-04-22 DIAGNOSIS — E785 Hyperlipidemia, unspecified: Secondary | ICD-10-CM | POA: Diagnosis not present

## 2017-04-22 DIAGNOSIS — I10 Essential (primary) hypertension: Secondary | ICD-10-CM | POA: Diagnosis not present

## 2017-06-03 DIAGNOSIS — M0589 Other rheumatoid arthritis with rheumatoid factor of multiple sites: Secondary | ICD-10-CM | POA: Diagnosis not present

## 2017-06-03 DIAGNOSIS — Z79899 Other long term (current) drug therapy: Secondary | ICD-10-CM | POA: Diagnosis not present

## 2017-06-25 DIAGNOSIS — E663 Overweight: Secondary | ICD-10-CM | POA: Diagnosis not present

## 2017-06-25 DIAGNOSIS — M150 Primary generalized (osteo)arthritis: Secondary | ICD-10-CM | POA: Diagnosis not present

## 2017-06-25 DIAGNOSIS — M0589 Other rheumatoid arthritis with rheumatoid factor of multiple sites: Secondary | ICD-10-CM | POA: Diagnosis not present

## 2017-06-25 DIAGNOSIS — M25551 Pain in right hip: Secondary | ICD-10-CM | POA: Diagnosis not present

## 2017-06-25 DIAGNOSIS — N183 Chronic kidney disease, stage 3 (moderate): Secondary | ICD-10-CM | POA: Diagnosis not present

## 2017-06-25 DIAGNOSIS — Z6827 Body mass index (BMI) 27.0-27.9, adult: Secondary | ICD-10-CM | POA: Diagnosis not present

## 2017-06-25 DIAGNOSIS — M8580 Other specified disorders of bone density and structure, unspecified site: Secondary | ICD-10-CM | POA: Diagnosis not present

## 2017-06-27 DIAGNOSIS — I10 Essential (primary) hypertension: Secondary | ICD-10-CM | POA: Diagnosis not present

## 2017-06-27 DIAGNOSIS — D649 Anemia, unspecified: Secondary | ICD-10-CM | POA: Diagnosis not present

## 2017-06-27 DIAGNOSIS — R42 Dizziness and giddiness: Secondary | ICD-10-CM | POA: Diagnosis not present

## 2017-06-27 DIAGNOSIS — I951 Orthostatic hypotension: Secondary | ICD-10-CM | POA: Diagnosis not present

## 2017-07-07 DIAGNOSIS — R300 Dysuria: Secondary | ICD-10-CM | POA: Diagnosis not present

## 2017-07-07 DIAGNOSIS — E871 Hypo-osmolality and hyponatremia: Secondary | ICD-10-CM | POA: Diagnosis not present

## 2017-07-23 DIAGNOSIS — H04123 Dry eye syndrome of bilateral lacrimal glands: Secondary | ICD-10-CM | POA: Diagnosis not present

## 2017-07-23 DIAGNOSIS — H01001 Unspecified blepharitis right upper eyelid: Secondary | ICD-10-CM | POA: Diagnosis not present

## 2017-07-23 DIAGNOSIS — H40053 Ocular hypertension, bilateral: Secondary | ICD-10-CM | POA: Diagnosis not present

## 2017-07-23 DIAGNOSIS — H01002 Unspecified blepharitis right lower eyelid: Secondary | ICD-10-CM | POA: Diagnosis not present

## 2017-07-24 DIAGNOSIS — I10 Essential (primary) hypertension: Secondary | ICD-10-CM | POA: Diagnosis not present

## 2017-07-24 DIAGNOSIS — R42 Dizziness and giddiness: Secondary | ICD-10-CM | POA: Diagnosis not present

## 2017-07-24 DIAGNOSIS — H9313 Tinnitus, bilateral: Secondary | ICD-10-CM | POA: Diagnosis not present

## 2017-07-29 DIAGNOSIS — M0589 Other rheumatoid arthritis with rheumatoid factor of multiple sites: Secondary | ICD-10-CM | POA: Diagnosis not present

## 2017-07-29 DIAGNOSIS — Z79899 Other long term (current) drug therapy: Secondary | ICD-10-CM | POA: Diagnosis not present

## 2017-07-30 DIAGNOSIS — R55 Syncope and collapse: Secondary | ICD-10-CM | POA: Diagnosis not present

## 2017-08-29 DIAGNOSIS — Z1231 Encounter for screening mammogram for malignant neoplasm of breast: Secondary | ICD-10-CM | POA: Diagnosis not present

## 2017-08-29 DIAGNOSIS — Z803 Family history of malignant neoplasm of breast: Secondary | ICD-10-CM | POA: Diagnosis not present

## 2017-09-23 DIAGNOSIS — M0589 Other rheumatoid arthritis with rheumatoid factor of multiple sites: Secondary | ICD-10-CM | POA: Diagnosis not present

## 2017-09-23 DIAGNOSIS — Z79899 Other long term (current) drug therapy: Secondary | ICD-10-CM | POA: Diagnosis not present

## 2017-10-01 DIAGNOSIS — K580 Irritable bowel syndrome with diarrhea: Secondary | ICD-10-CM | POA: Diagnosis not present

## 2017-10-01 DIAGNOSIS — R197 Diarrhea, unspecified: Secondary | ICD-10-CM | POA: Diagnosis not present

## 2017-10-01 DIAGNOSIS — R1013 Epigastric pain: Secondary | ICD-10-CM | POA: Diagnosis not present

## 2017-10-06 DIAGNOSIS — R197 Diarrhea, unspecified: Secondary | ICD-10-CM | POA: Diagnosis not present

## 2017-10-28 DIAGNOSIS — K580 Irritable bowel syndrome with diarrhea: Secondary | ICD-10-CM | POA: Diagnosis not present

## 2017-10-29 DIAGNOSIS — M0589 Other rheumatoid arthritis with rheumatoid factor of multiple sites: Secondary | ICD-10-CM | POA: Diagnosis not present

## 2017-10-29 DIAGNOSIS — M25551 Pain in right hip: Secondary | ICD-10-CM | POA: Diagnosis not present

## 2017-10-29 DIAGNOSIS — E663 Overweight: Secondary | ICD-10-CM | POA: Diagnosis not present

## 2017-10-29 DIAGNOSIS — N183 Chronic kidney disease, stage 3 (moderate): Secondary | ICD-10-CM | POA: Diagnosis not present

## 2017-10-29 DIAGNOSIS — M150 Primary generalized (osteo)arthritis: Secondary | ICD-10-CM | POA: Diagnosis not present

## 2017-10-29 DIAGNOSIS — M8580 Other specified disorders of bone density and structure, unspecified site: Secondary | ICD-10-CM | POA: Diagnosis not present

## 2017-10-29 DIAGNOSIS — Z6826 Body mass index (BMI) 26.0-26.9, adult: Secondary | ICD-10-CM | POA: Diagnosis not present

## 2017-11-24 DIAGNOSIS — Z79899 Other long term (current) drug therapy: Secondary | ICD-10-CM | POA: Diagnosis not present

## 2017-11-24 DIAGNOSIS — M0589 Other rheumatoid arthritis with rheumatoid factor of multiple sites: Secondary | ICD-10-CM | POA: Diagnosis not present

## 2017-11-26 DIAGNOSIS — H903 Sensorineural hearing loss, bilateral: Secondary | ICD-10-CM | POA: Diagnosis not present

## 2017-11-26 DIAGNOSIS — H9313 Tinnitus, bilateral: Secondary | ICD-10-CM | POA: Diagnosis not present

## 2017-12-05 DIAGNOSIS — H16223 Keratoconjunctivitis sicca, not specified as Sjogren's, bilateral: Secondary | ICD-10-CM | POA: Diagnosis not present

## 2017-12-05 DIAGNOSIS — R51 Headache: Secondary | ICD-10-CM | POA: Diagnosis not present

## 2017-12-30 ENCOUNTER — Encounter (HOSPITAL_COMMUNITY): Payer: Self-pay | Admitting: Emergency Medicine

## 2017-12-30 ENCOUNTER — Inpatient Hospital Stay (HOSPITAL_COMMUNITY)
Admission: EM | Admit: 2017-12-30 | Discharge: 2018-01-04 | DRG: 564 | Disposition: A | Discharge status: Home Health | Payer: Medicare Other | Attending: Internal Medicine | Admitting: Internal Medicine

## 2017-12-30 ENCOUNTER — Emergency Department (HOSPITAL_COMMUNITY): Payer: Medicare Other

## 2017-12-30 DIAGNOSIS — R945 Abnormal results of liver function studies: Secondary | ICD-10-CM | POA: Diagnosis present

## 2017-12-30 DIAGNOSIS — E785 Hyperlipidemia, unspecified: Secondary | ICD-10-CM | POA: Diagnosis present

## 2017-12-30 DIAGNOSIS — Z888 Allergy status to other drugs, medicaments and biological substances status: Secondary | ICD-10-CM | POA: Diagnosis not present

## 2017-12-30 DIAGNOSIS — G9341 Metabolic encephalopathy: Secondary | ICD-10-CM | POA: Diagnosis present

## 2017-12-30 DIAGNOSIS — Z23 Encounter for immunization: Secondary | ICD-10-CM | POA: Diagnosis not present

## 2017-12-30 DIAGNOSIS — W19XXXD Unspecified fall, subsequent encounter: Secondary | ICD-10-CM | POA: Diagnosis present

## 2017-12-30 DIAGNOSIS — I5030 Unspecified diastolic (congestive) heart failure: Secondary | ICD-10-CM | POA: Diagnosis not present

## 2017-12-30 DIAGNOSIS — N183 Chronic kidney disease, stage 3 unspecified: Secondary | ICD-10-CM | POA: Diagnosis present

## 2017-12-30 DIAGNOSIS — D72829 Elevated white blood cell count, unspecified: Secondary | ICD-10-CM | POA: Diagnosis present

## 2017-12-30 DIAGNOSIS — W19XXXA Unspecified fall, initial encounter: Secondary | ICD-10-CM | POA: Diagnosis not present

## 2017-12-30 DIAGNOSIS — R4182 Altered mental status, unspecified: Secondary | ICD-10-CM | POA: Diagnosis not present

## 2017-12-30 DIAGNOSIS — Z803 Family history of malignant neoplasm of breast: Secondary | ICD-10-CM

## 2017-12-30 DIAGNOSIS — D631 Anemia in chronic kidney disease: Secondary | ICD-10-CM | POA: Diagnosis present

## 2017-12-30 DIAGNOSIS — R0989 Other specified symptoms and signs involving the circulatory and respiratory systems: Secondary | ICD-10-CM | POA: Diagnosis present

## 2017-12-30 DIAGNOSIS — J811 Chronic pulmonary edema: Secondary | ICD-10-CM | POA: Diagnosis present

## 2017-12-30 DIAGNOSIS — Z881 Allergy status to other antibiotic agents status: Secondary | ICD-10-CM

## 2017-12-30 DIAGNOSIS — I517 Cardiomegaly: Secondary | ICD-10-CM | POA: Diagnosis present

## 2017-12-30 DIAGNOSIS — I493 Ventricular premature depolarization: Secondary | ICD-10-CM | POA: Diagnosis present

## 2017-12-30 DIAGNOSIS — S199XXA Unspecified injury of neck, initial encounter: Secondary | ICD-10-CM | POA: Diagnosis not present

## 2017-12-30 DIAGNOSIS — T796XXA Traumatic ischemia of muscle, initial encounter: Secondary | ICD-10-CM

## 2017-12-30 DIAGNOSIS — S0990XA Unspecified injury of head, initial encounter: Secondary | ICD-10-CM | POA: Diagnosis not present

## 2017-12-30 DIAGNOSIS — R0902 Hypoxemia: Secondary | ICD-10-CM | POA: Diagnosis not present

## 2017-12-30 DIAGNOSIS — Z9181 History of falling: Secondary | ICD-10-CM

## 2017-12-30 DIAGNOSIS — Z79899 Other long term (current) drug therapy: Secondary | ICD-10-CM

## 2017-12-30 DIAGNOSIS — J90 Pleural effusion, not elsewhere classified: Secondary | ICD-10-CM | POA: Diagnosis not present

## 2017-12-30 DIAGNOSIS — R748 Abnormal levels of other serum enzymes: Secondary | ICD-10-CM | POA: Diagnosis not present

## 2017-12-30 DIAGNOSIS — Z7951 Long term (current) use of inhaled steroids: Secondary | ICD-10-CM | POA: Diagnosis not present

## 2017-12-30 DIAGNOSIS — Z801 Family history of malignant neoplasm of trachea, bronchus and lung: Secondary | ICD-10-CM

## 2017-12-30 DIAGNOSIS — E876 Hypokalemia: Secondary | ICD-10-CM | POA: Diagnosis present

## 2017-12-30 DIAGNOSIS — R7989 Other specified abnormal findings of blood chemistry: Secondary | ICD-10-CM | POA: Diagnosis present

## 2017-12-30 DIAGNOSIS — I480 Paroxysmal atrial fibrillation: Secondary | ICD-10-CM | POA: Diagnosis present

## 2017-12-30 DIAGNOSIS — M069 Rheumatoid arthritis, unspecified: Secondary | ICD-10-CM | POA: Diagnosis present

## 2017-12-30 DIAGNOSIS — R410 Disorientation, unspecified: Secondary | ICD-10-CM | POA: Diagnosis not present

## 2017-12-30 DIAGNOSIS — Z9049 Acquired absence of other specified parts of digestive tract: Secondary | ICD-10-CM | POA: Diagnosis not present

## 2017-12-30 DIAGNOSIS — R778 Other specified abnormalities of plasma proteins: Secondary | ICD-10-CM | POA: Diagnosis present

## 2017-12-30 DIAGNOSIS — M6282 Rhabdomyolysis: Secondary | ICD-10-CM | POA: Diagnosis present

## 2017-12-30 LAB — COMPREHENSIVE METABOLIC PANEL
ALK PHOS: 60 U/L (ref 38–126)
ALT: 36 U/L (ref 0–44)
AST: 114 U/L — ABNORMAL HIGH (ref 15–41)
Albumin: 3.8 g/dL (ref 3.5–5.0)
Anion gap: 14 (ref 5–15)
BUN: 27 mg/dL — AB (ref 8–23)
CALCIUM: 9.9 mg/dL (ref 8.9–10.3)
CO2: 26 mmol/L (ref 22–32)
CREATININE: 1.3 mg/dL — AB (ref 0.44–1.00)
Chloride: 92 mmol/L — ABNORMAL LOW (ref 98–111)
GFR, EST AFRICAN AMERICAN: 43 mL/min — AB (ref >60)
GFR, EST NON AFRICAN AMERICAN: 37 mL/min — AB (ref >60)
Glucose, Bld: 142 mg/dL — ABNORMAL HIGH (ref 70–99)
Potassium: 3.9 mmol/L (ref 3.5–5.1)
Sodium: 132 mmol/L — ABNORMAL LOW (ref 135–145)
Total Bilirubin: 1.1 mg/dL (ref 0.3–1.2)
Total Protein: 7.2 g/dL (ref 6.5–8.1)

## 2017-12-30 LAB — ETHANOL

## 2017-12-30 LAB — I-STAT CHEM 8, ED
BUN: 36 mg/dL — ABNORMAL HIGH (ref 8–23)
CALCIUM ION: 1.14 mmol/L — AB (ref 1.15–1.40)
CREATININE: 1.1 mg/dL — AB (ref 0.44–1.00)
Chloride: 94 mmol/L — ABNORMAL LOW (ref 98–111)
GLUCOSE: 138 mg/dL — AB (ref 70–99)
HCT: 44 % (ref 36.0–46.0)
HEMOGLOBIN: 15 g/dL (ref 12.0–15.0)
POTASSIUM: 4.2 mmol/L (ref 3.5–5.1)
Sodium: 131 mmol/L — ABNORMAL LOW (ref 135–145)
TCO2: 28 mmol/L (ref 22–32)

## 2017-12-30 LAB — I-STAT TROPONIN, ED: TROPONIN I, POC: 0.22 ng/mL — AB (ref 0.00–0.08)

## 2017-12-30 LAB — CBC WITH DIFFERENTIAL/PLATELET
ABS IMMATURE GRANULOCYTES: 0.09 10*3/uL — AB (ref 0.00–0.07)
BASOS ABS: 0 10*3/uL (ref 0.0–0.1)
Basophils Relative: 0 %
Eosinophils Absolute: 0 10*3/uL (ref 0.0–0.5)
Eosinophils Relative: 0 %
HCT: 41 % (ref 36.0–46.0)
HEMOGLOBIN: 13.4 g/dL (ref 12.0–15.0)
IMMATURE GRANULOCYTES: 0 %
LYMPHS ABS: 0.3 10*3/uL — AB (ref 0.7–4.0)
Lymphocytes Relative: 1 %
MCH: 30.5 pg (ref 26.0–34.0)
MCHC: 32.7 g/dL (ref 30.0–36.0)
MCV: 93.4 fL (ref 80.0–100.0)
MONOS PCT: 4 %
Monocytes Absolute: 0.8 10*3/uL (ref 0.1–1.0)
NRBC: 0 % (ref 0.0–0.2)
Neutro Abs: 19.1 10*3/uL — ABNORMAL HIGH (ref 1.7–7.7)
Neutrophils Relative %: 95 %
Platelets: 284 10*3/uL (ref 150–400)
RBC: 4.39 MIL/uL (ref 3.87–5.11)
RDW: 13.8 % (ref 11.5–15.5)
WBC: 20.3 10*3/uL — ABNORMAL HIGH (ref 4.0–10.5)

## 2017-12-30 LAB — PROTIME-INR
INR: 1.14
PROTHROMBIN TIME: 14.5 s (ref 11.4–15.2)

## 2017-12-30 LAB — SALICYLATE LEVEL: Salicylate Lvl: <7 mg/dL (ref 2.8–30.0)

## 2017-12-30 LAB — CK: CK TOTAL: 3293 U/L — AB (ref 38–234)

## 2017-12-30 LAB — ACETAMINOPHEN LEVEL

## 2017-12-30 LAB — I-STAT CG4 LACTIC ACID, ED: LACTIC ACID, VENOUS: 1.47 mmol/L (ref 0.5–1.9)

## 2017-12-30 IMAGING — CR DG CHEST 2V
2 series · 2 of 2 positions shown · non-contrast
Comparison: [DATE]

CLINICAL DATA: Found down, altered

EXAM:
CHEST - 2 VIEW

[chest lat]
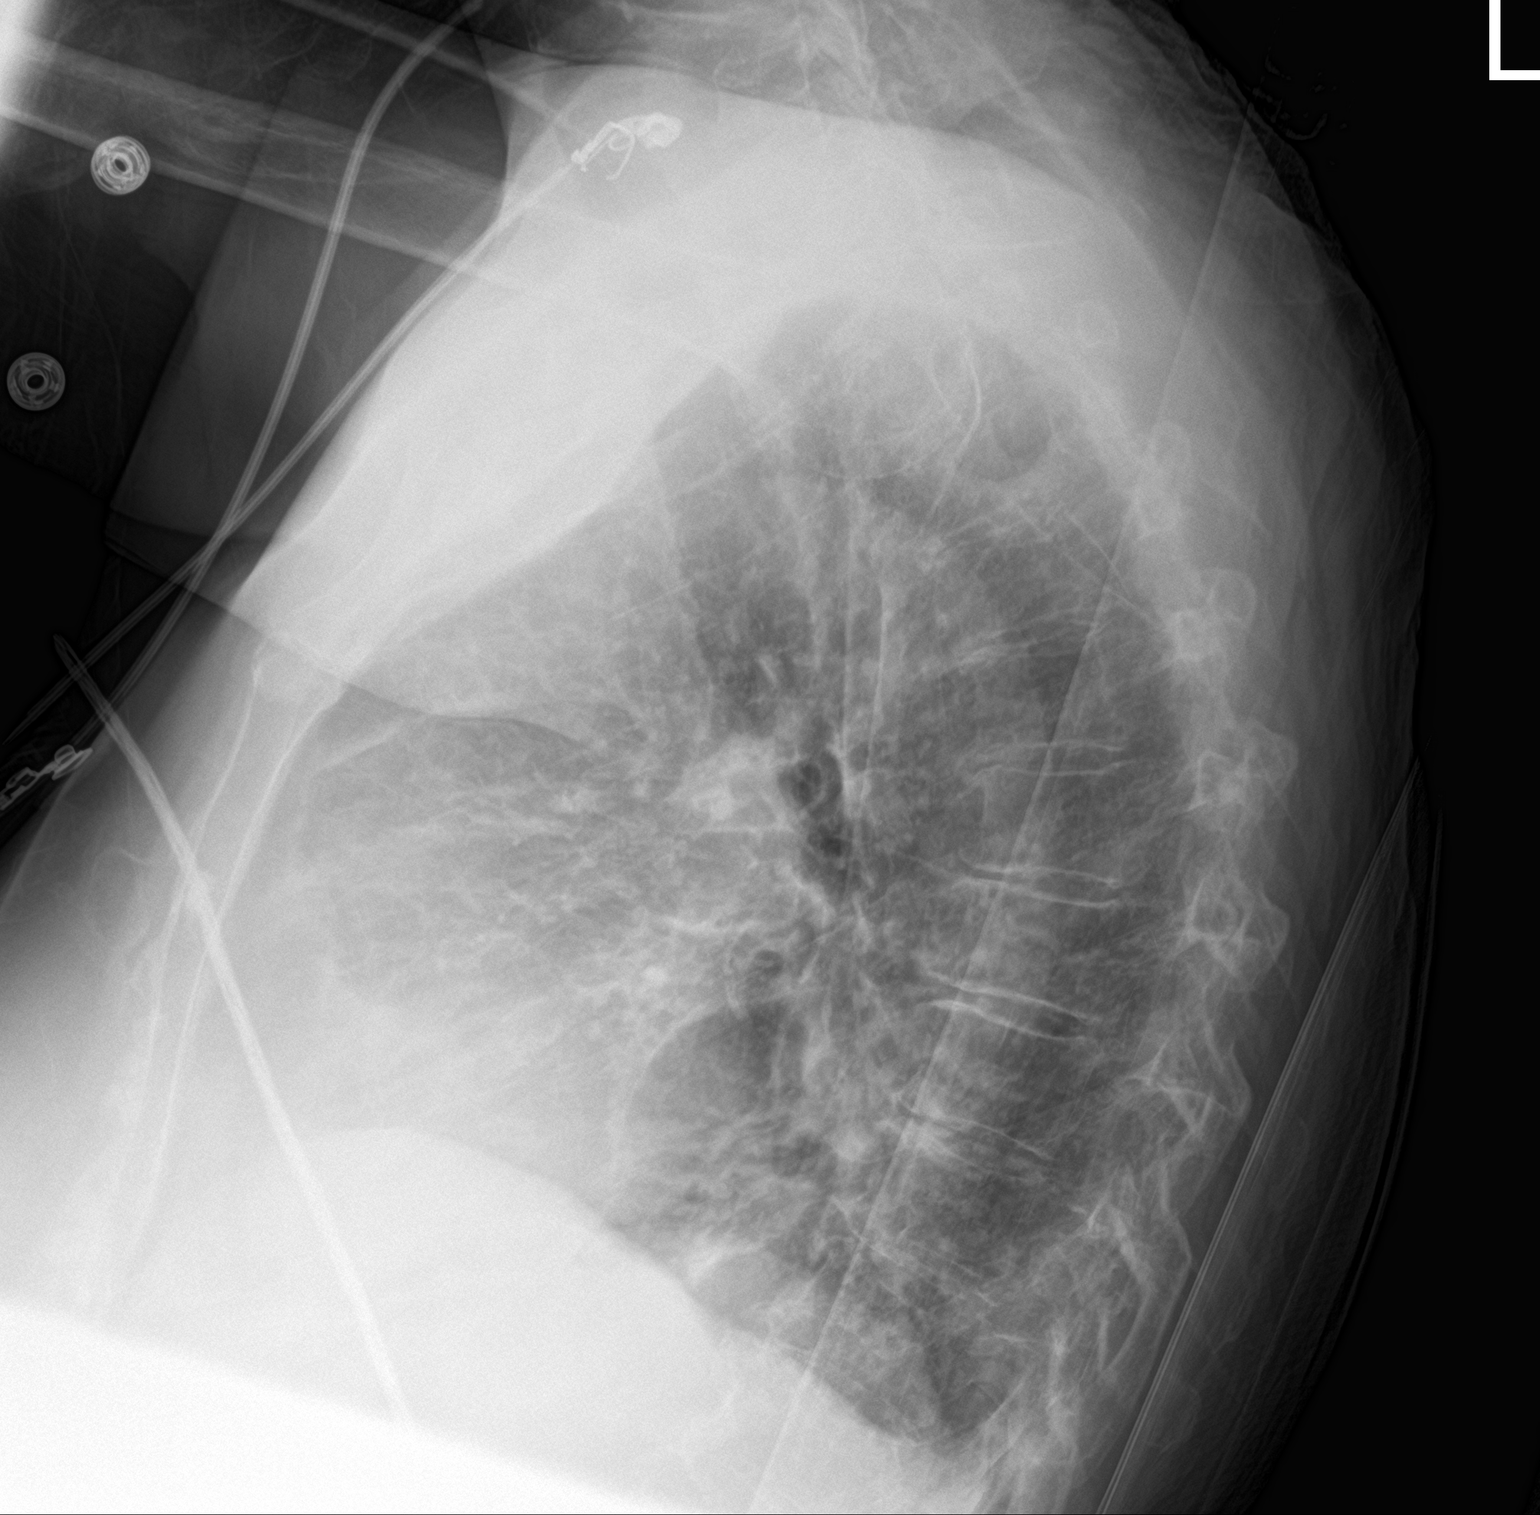

[chest ap]
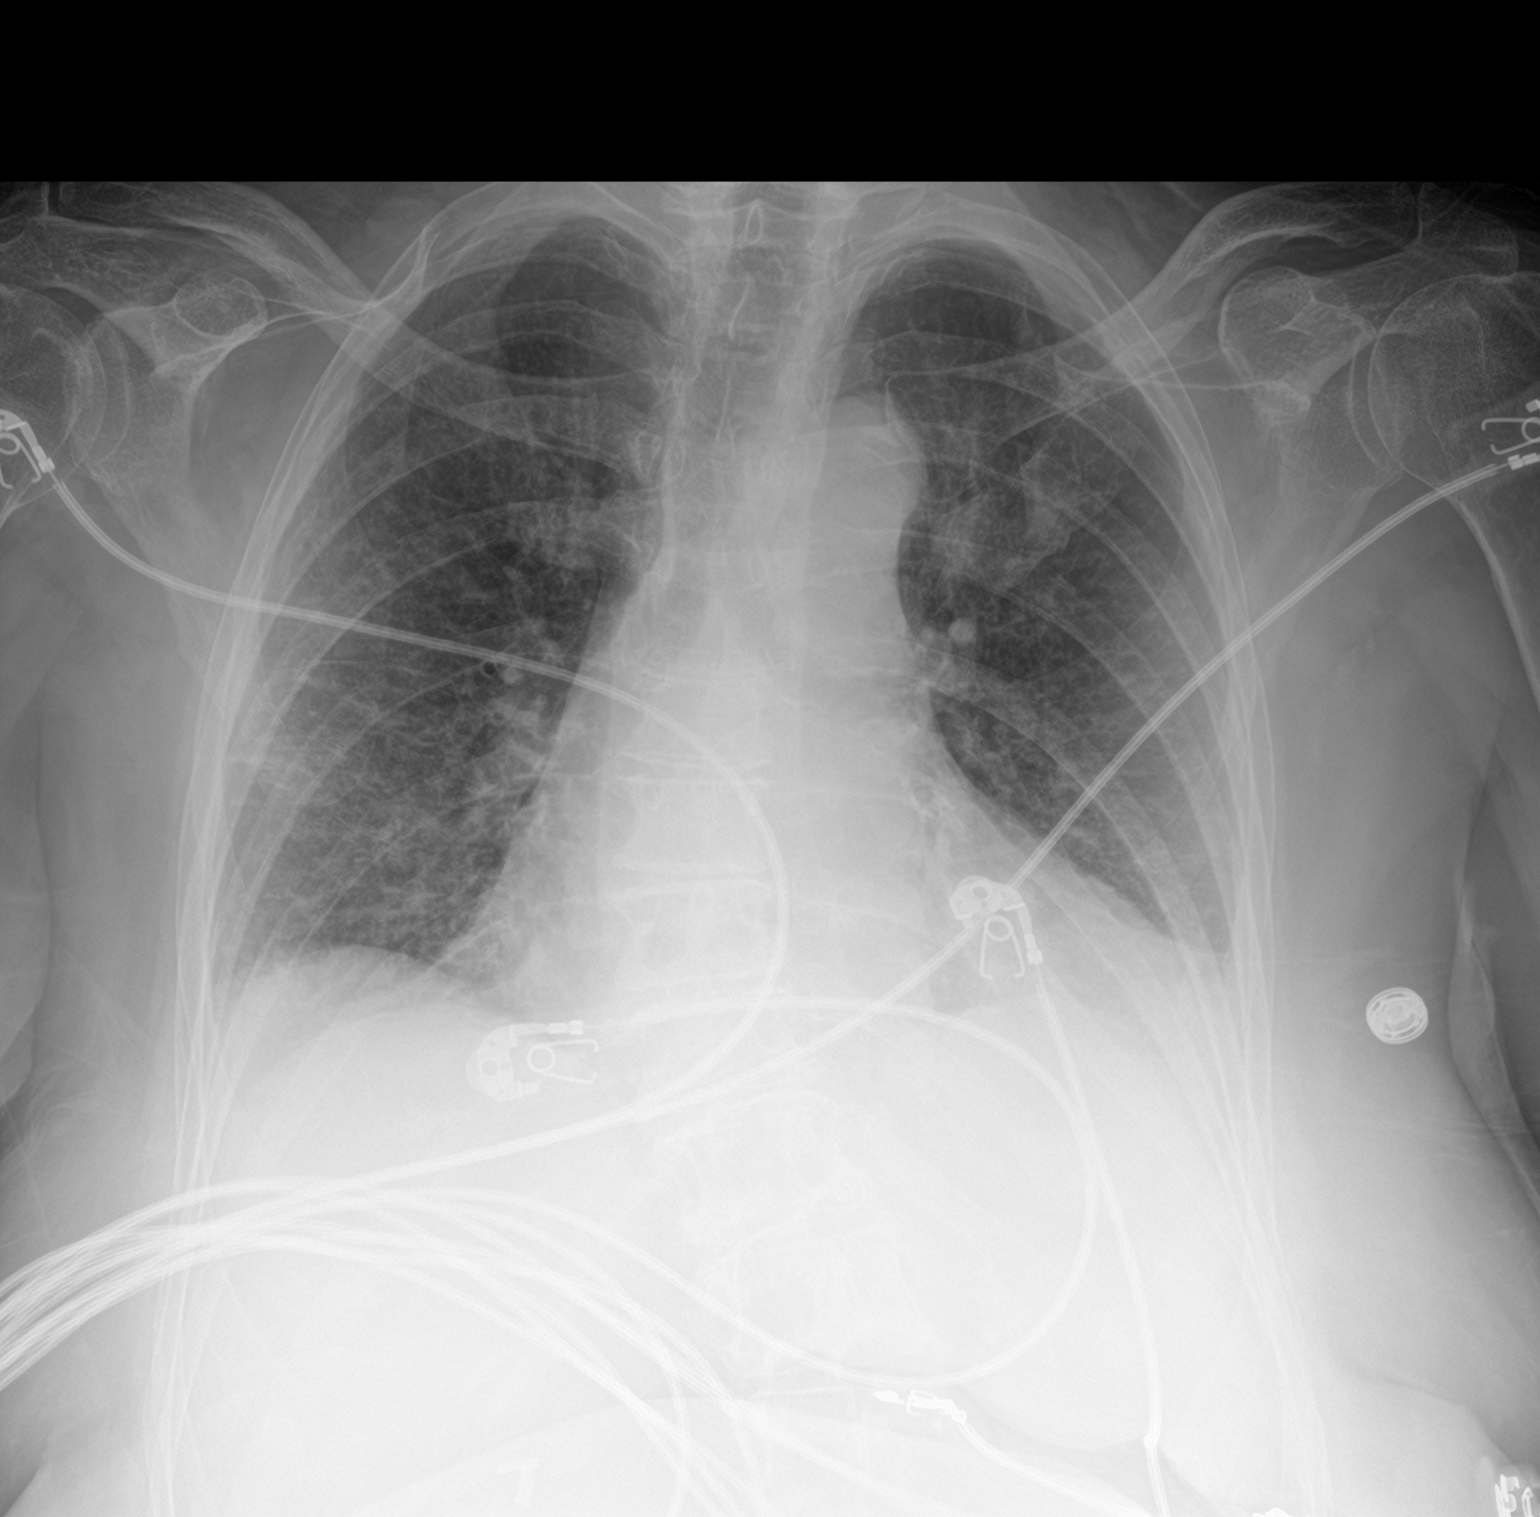

[2 of 2 positions shown; findings below may reference images not displayed]

FINDINGS: Trace pleural effusions. Cardiomegaly with mild diffuse interstitial
opacity suggesting minimal edema. Minimal atelectasis left base. No
pneumothorax.
IMPRESSION: 1. Trace pleural effusions.
2. Cardiomegaly with mild diffuse interstitial opacity suggesting
mild interstitial edema

## 2017-12-30 IMAGING — CT CT CERVICAL SPINE W/O CM
4 of 5 series · 14 of 33 positions shown, 16 images · non-contrast
Comparison: None.

CLINICAL DATA: Unwitnessed fall. Altered mental status with
bruising to the arms, elbows, and face.

EXAM:
CT HEAD WITHOUT CONTRAST
CT CERVICAL SPINE WITHOUT CONTRAST
TECHNIQUE: Multidetector CT imaging of the head and cervical spine was
performed following the standard protocol without intravenous
contrast. Multiplanar CT image reconstructions of the cervical spine
were also generated.

[Series 6: orthogonal axial bone · axial · 0.21mm/px · z∈[+1323,+1425]mm · 3 of 92 slices shown, 4 images]
[im 23/92  soft-tissue]
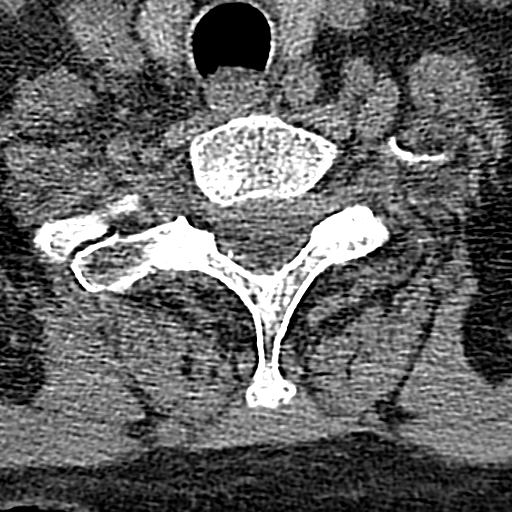
[im 23/92  bone]
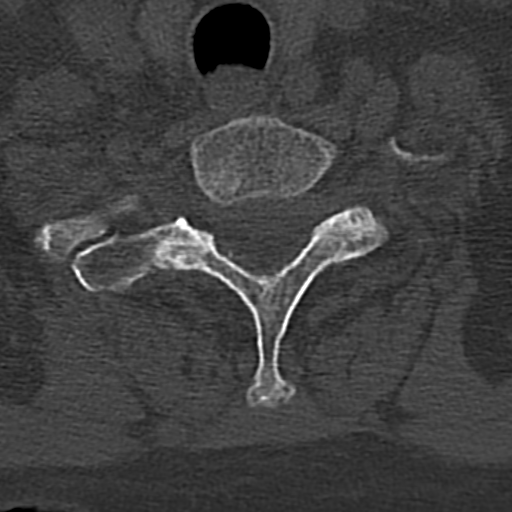
[im 46/92  bone]
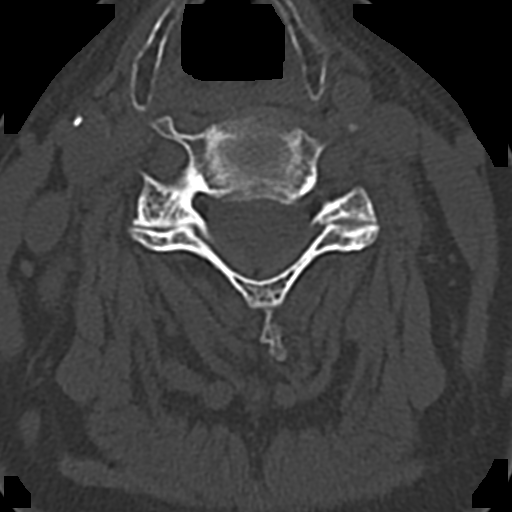
[im 69/92  bone]
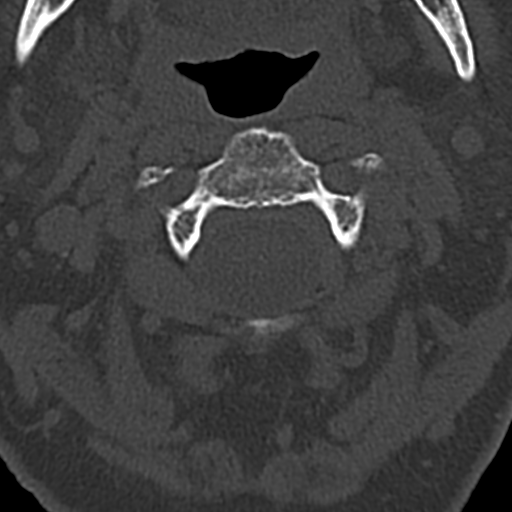

[Series 7: orthogonal axial st · axial · 0.21mm/px · z∈[+1323,+1425]mm · 3 of 92 slices shown]
[im 23/92  bone]
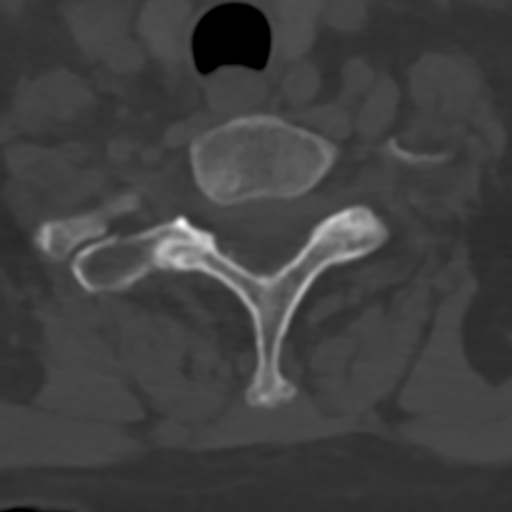
[im 46/92  bone]
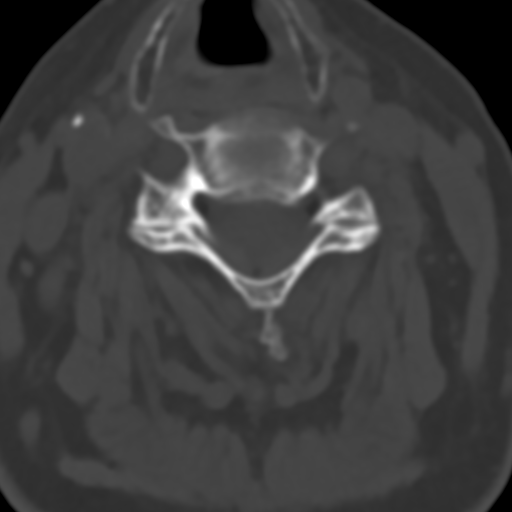
[im 69/92  bone]
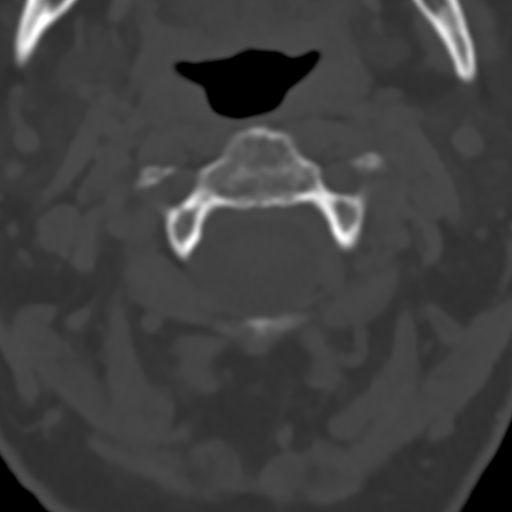

[Series 10: coronal bone · coronal · 0.23mm/px · 3 of 61 slices shown]
[im 13/61  bone]
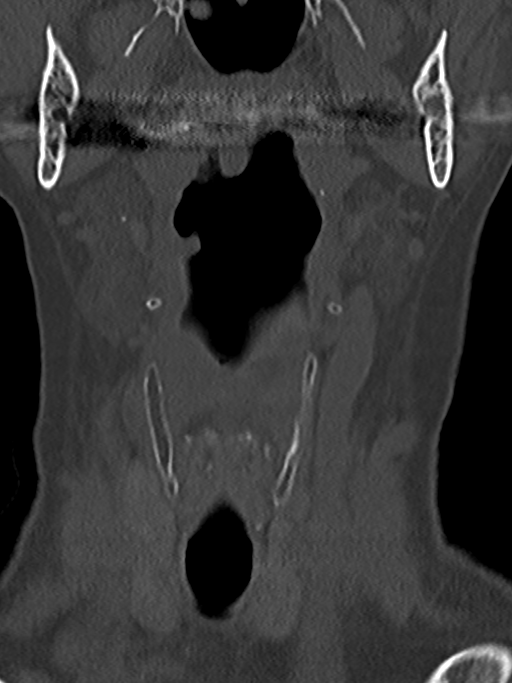
[im 25/61  bone]
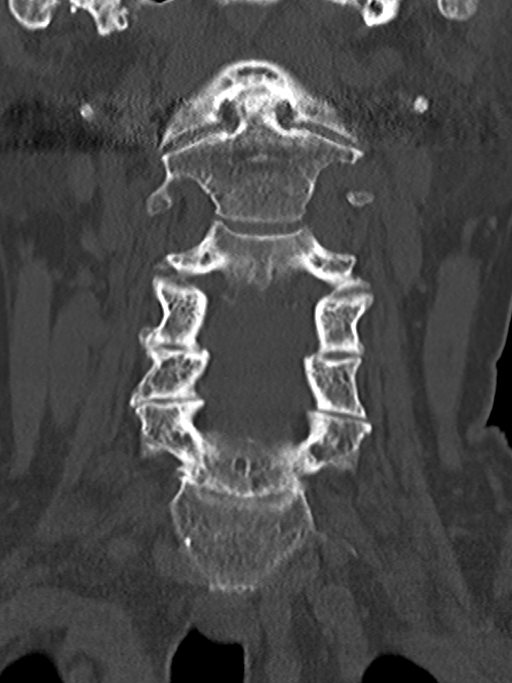
[im 37/61  bone]
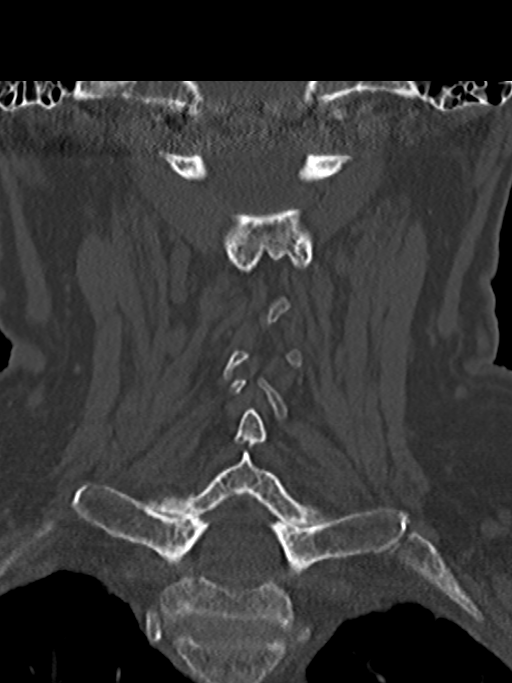

[Series 11: sagittal bone · sagittal · 0.23mm/px · 5 of 61 slices shown, 6 images]
[im 21/61  bone]
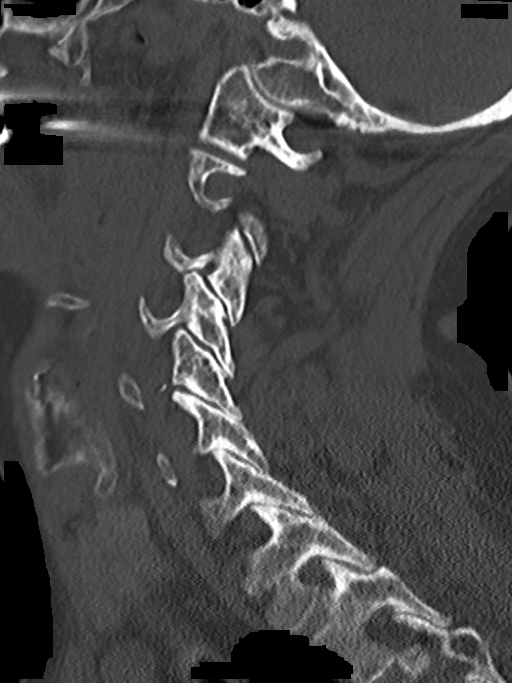
[im 26/61  bone]
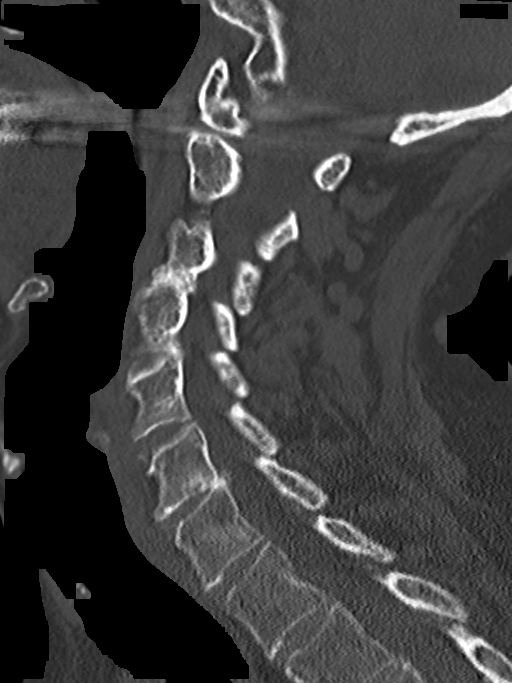
[im 31/61  soft-tissue]
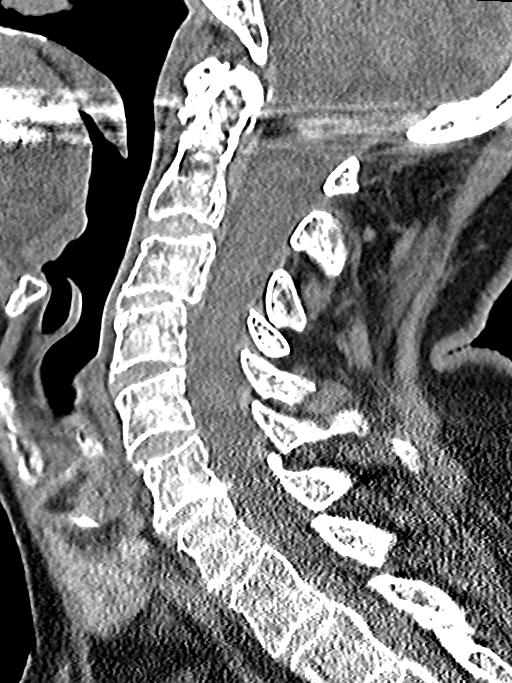
[im 31/61  bone]
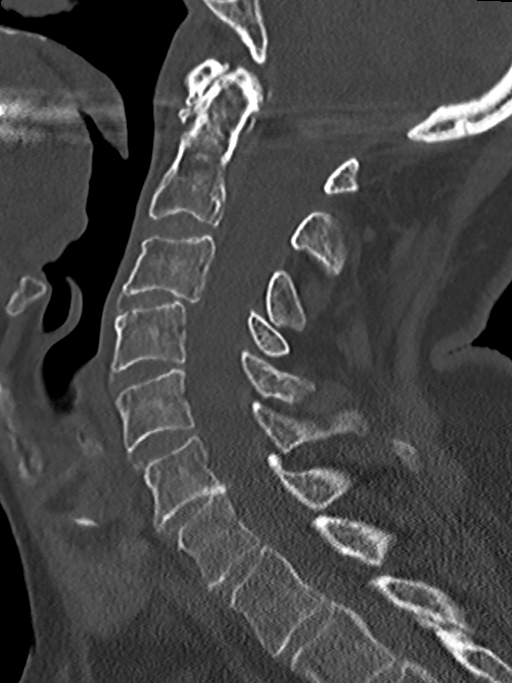
[im 36/61  bone]
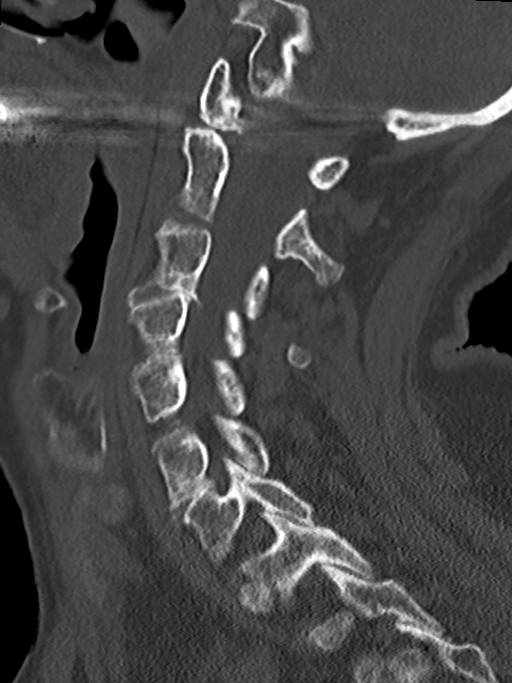
[im 41/61  bone]
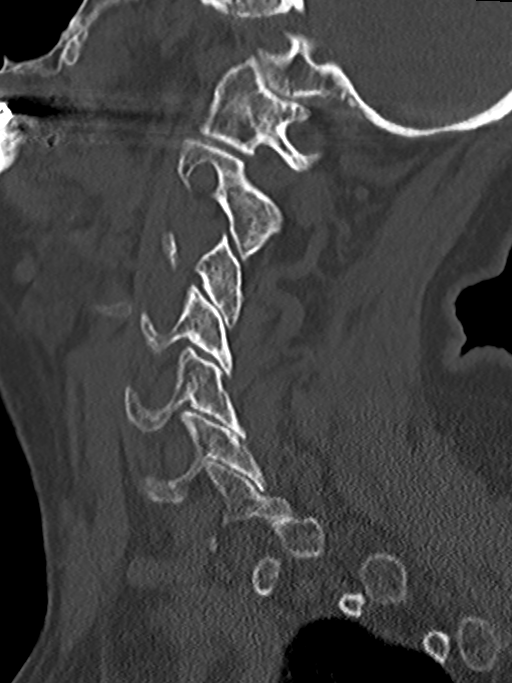

[14 of 33 positions shown; findings below may reference images not displayed]

FINDINGS: CT HEAD FINDINGS

Brain: Mild diffuse cerebral atrophy. Mild ventricular dilatation
consistent with central atrophy. Low-attenuation changes in the deep
white matter consistent with small vessel ischemia. No mass effect
or midline shift. No abnormal extra-axial fluid collections.
Gray-white matter junctions are distinct. Basal cisterns are not
effaced. No acute intracranial hemorrhage.

Vascular: Mild intracranial arterial vascular calcifications.

Skull: Calvarium appears intact. No acute depressed skull fractures.

Sinuses/Orbits: Mucosal thickening in the paranasal sinuses. No
acute air-fluid levels. Mastoid air cells are clear.

Other: None.

CT CERVICAL SPINE FINDINGS

Alignment: Normal alignment of the cervical vertebrae and facet
joints. C1-2 articulation appears intact.

Skull base and vertebrae: Skull base appears intact. No vertebral
compression deformities. No focal bone lesion or bone destruction.
Bone cortex appears intact.

Soft tissues and spinal canal: No prevertebral soft tissue swelling.
No abnormal soft tissue paraspinal mass or infiltration.

Disc levels: Degenerative changes in the cervical spine with
narrowed disc spaces and associated endplate hypertrophic changes.
Degenerative changes in the facet joints and at C1-2.

Upper chest: Motion artifact limits examination. Possible patchy
airspace infiltrates in the right apex. Edema versus pneumonia.

Other: None.
IMPRESSION: 1. No acute intracranial abnormalities. Chronic atrophy and small
vessel ischemic changes.
2. Normal alignment of the cervical spine. Degenerative changes. No
acute displaced fractures identified.

## 2017-12-30 IMAGING — CT CT CERVICAL SPINE W/O CM
3 series · 14 of 33 positions shown, 17 images · non-contrast
Comparison: None.

CLINICAL DATA: Unwitnessed fall. Altered mental status with
bruising to the arms, elbows, and face.

EXAM:
CT HEAD WITHOUT CONTRAST
CT CERVICAL SPINE WITHOUT CONTRAST
TECHNIQUE: Multidetector CT imaging of the head and cervical spine was
performed following the standard protocol without intravenous
contrast. Multiplanar CT image reconstructions of the cervical spine
were also generated.

[Series 3: head 5.0 h30s · axial · 0.43mm/px · z∈[+1481,+1611]mm · 6 of 35 slices shown, 8 images]
[im 6/35  soft-tissue]
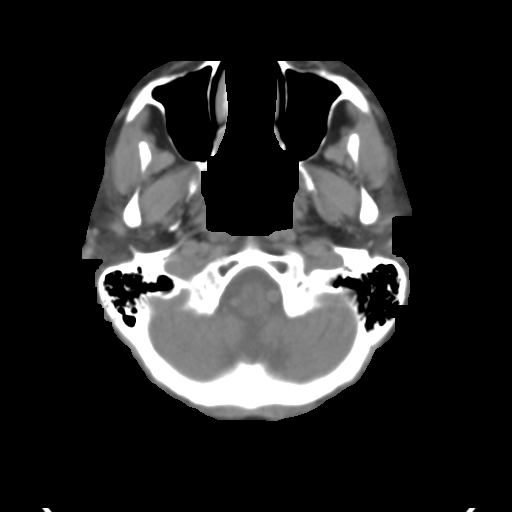
[im 6/35  bone]
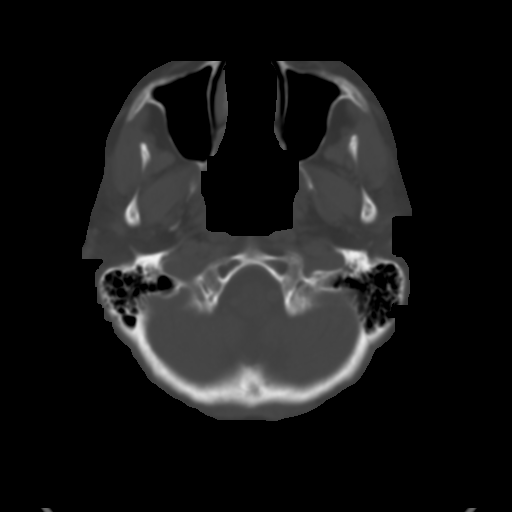
[im 11/35  bone]
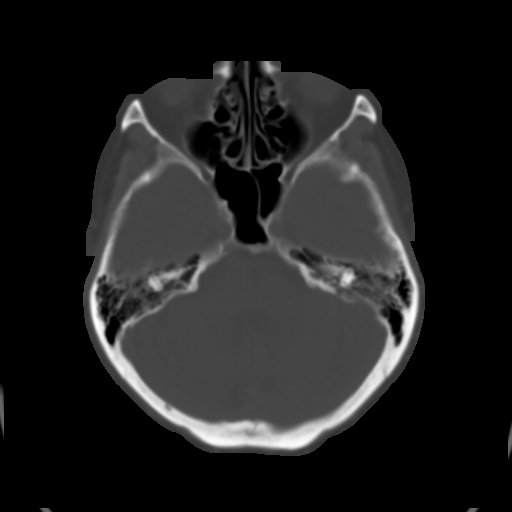
[im 16/35  bone]
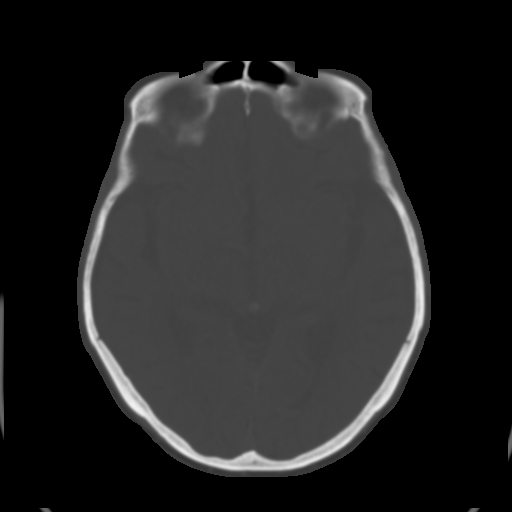
[im 21/35  bone]
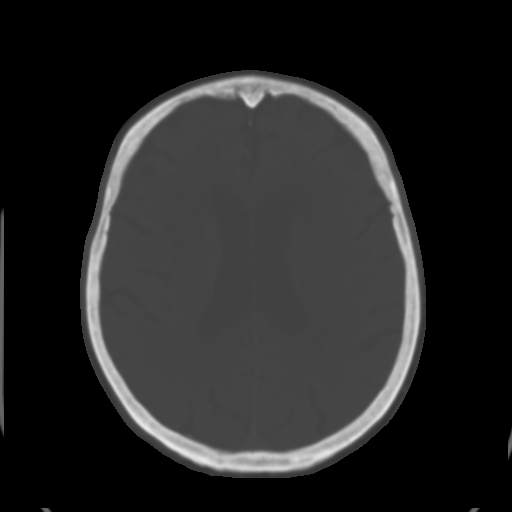
[im 27/35  soft-tissue]
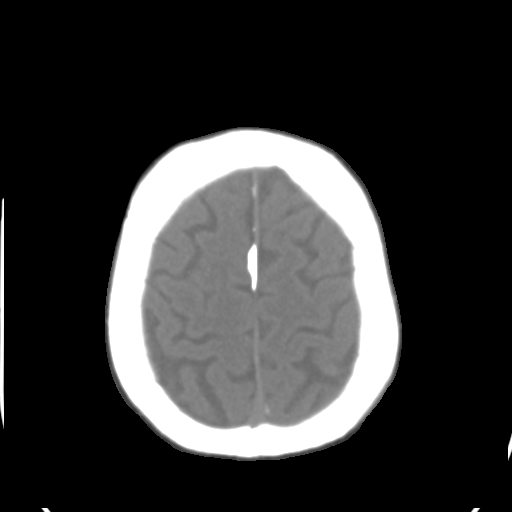
[im 27/35  bone]
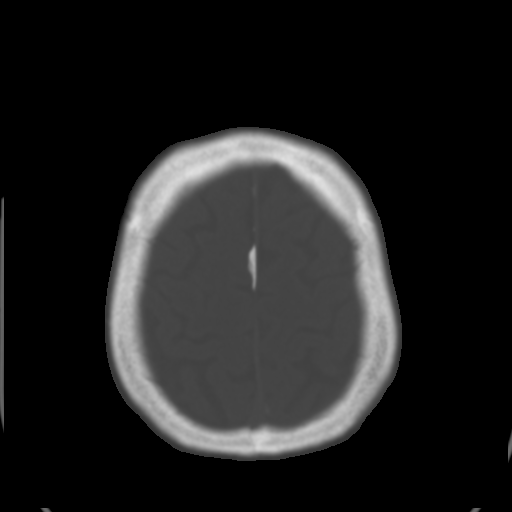
[im 32/35  bone]
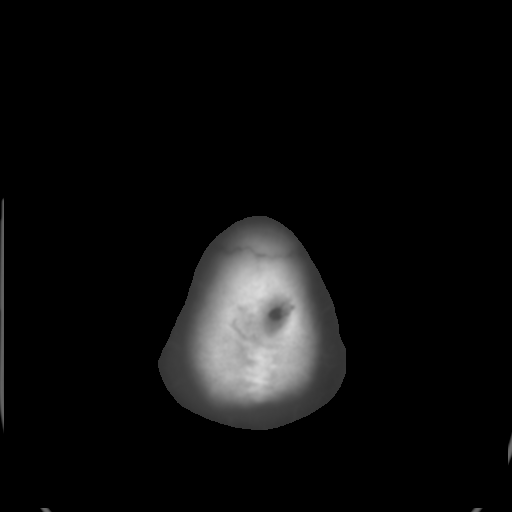

[Series 5: head 3.0 mpr cor · coronal · 0.33mm/px · 3 of 67 slices shown]
[im 14/67  bone]
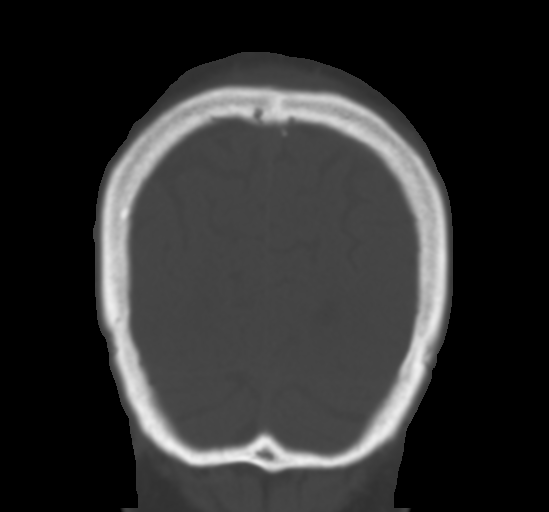
[im 27/67  bone]
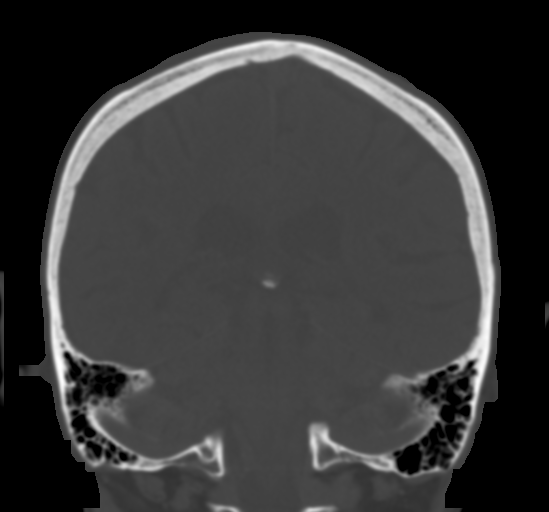
[im 40/67  bone]
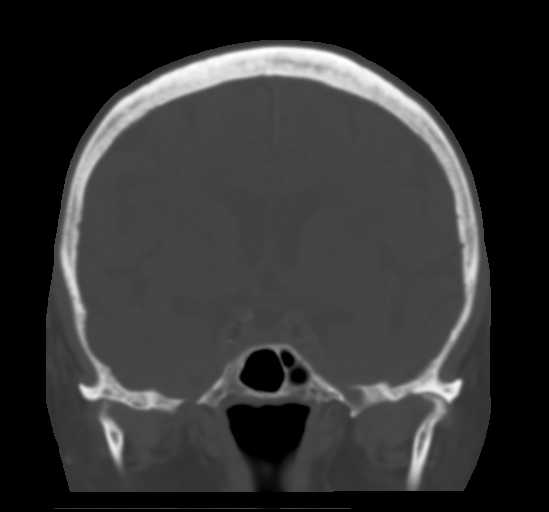

[Series 6: head 3.0 mpr sag · sagittal · 0.33mm/px · 5 of 57 slices shown, 6 images]
[im 19/57  bone]
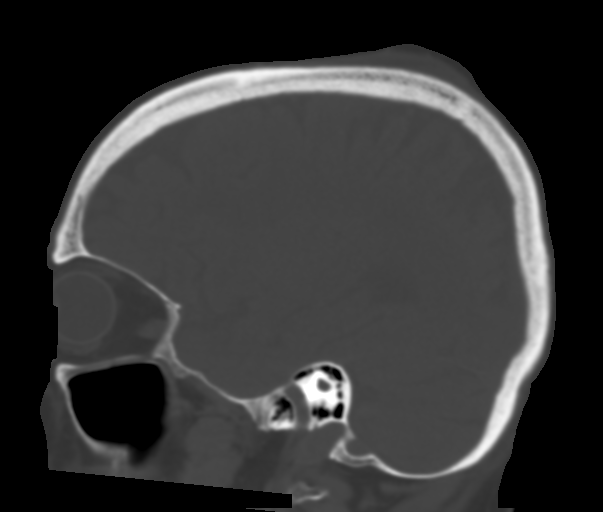
[im 24/57  bone]
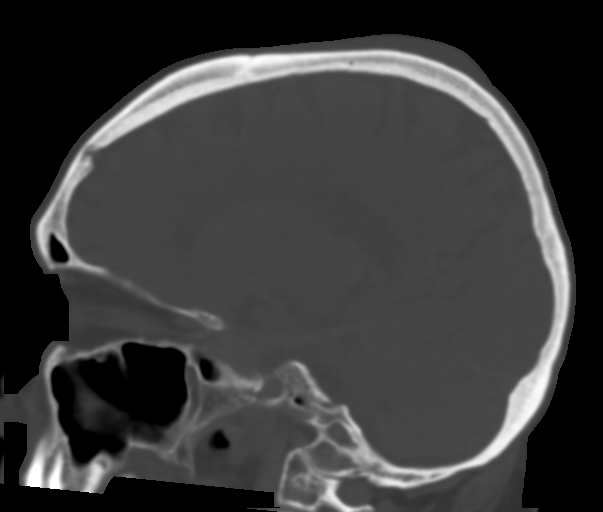
[im 29/57  soft-tissue]
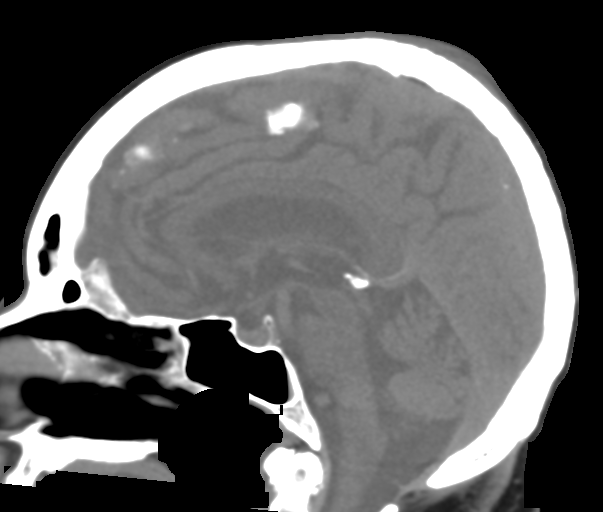
[im 29/57  bone]
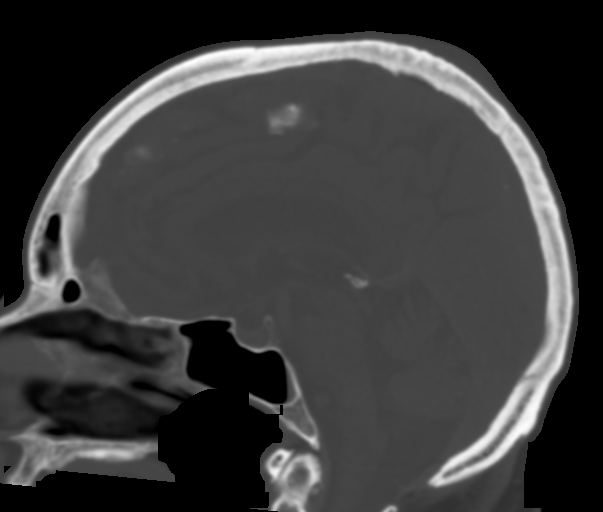
[im 33/57  bone]
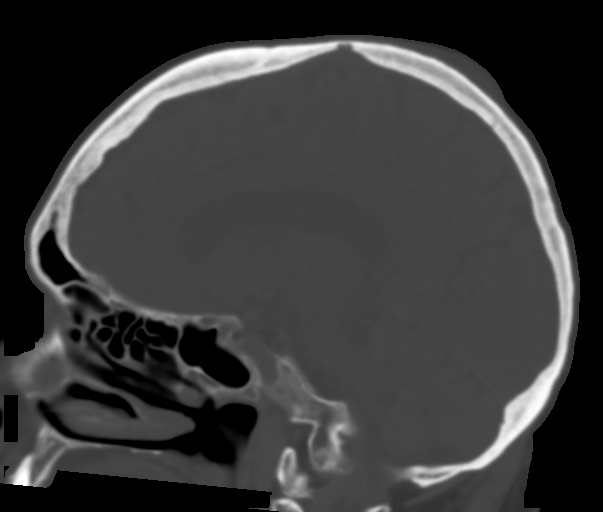
[im 38/57  bone]
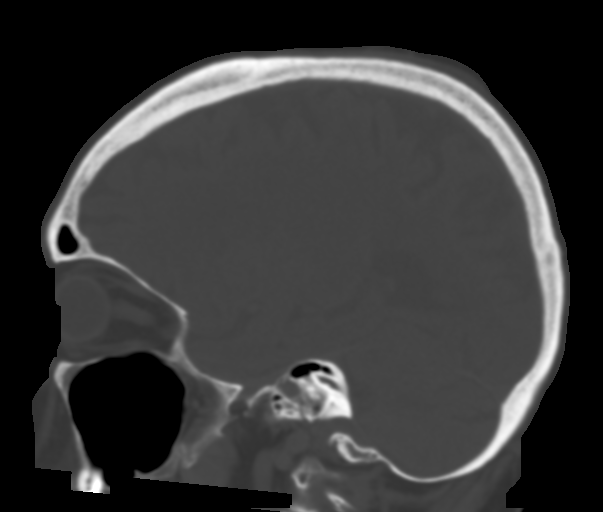

[14 of 33 positions shown; findings below may reference images not displayed]

FINDINGS: CT HEAD FINDINGS

Brain: Mild diffuse cerebral atrophy. Mild ventricular dilatation
consistent with central atrophy. Low-attenuation changes in the deep
white matter consistent with small vessel ischemia. No mass effect
or midline shift. No abnormal extra-axial fluid collections.
Gray-white matter junctions are distinct. Basal cisterns are not
effaced. No acute intracranial hemorrhage.

Vascular: Mild intracranial arterial vascular calcifications.

Skull: Calvarium appears intact. No acute depressed skull fractures.

Sinuses/Orbits: Mucosal thickening in the paranasal sinuses. No
acute air-fluid levels. Mastoid air cells are clear.

Other: None.

CT CERVICAL SPINE FINDINGS

Alignment: Normal alignment of the cervical vertebrae and facet
joints. C1-2 articulation appears intact.

Skull base and vertebrae: Skull base appears intact. No vertebral
compression deformities. No focal bone lesion or bone destruction.
Bone cortex appears intact.

Soft tissues and spinal canal: No prevertebral soft tissue swelling.
No abnormal soft tissue paraspinal mass or infiltration.

Disc levels: Degenerative changes in the cervical spine with
narrowed disc spaces and associated endplate hypertrophic changes.
Degenerative changes in the facet joints and at C1-2.

Upper chest: Motion artifact limits examination. Possible patchy
airspace infiltrates in the right apex. Edema versus pneumonia.

Other: None.
IMPRESSION: 1. No acute intracranial abnormalities. Chronic atrophy and small
vessel ischemic changes.
2. Normal alignment of the cervical spine. Degenerative changes. No
acute displaced fractures identified.

## 2017-12-30 MED ORDER — CYCLOBENZAPRINE HCL 10 MG PO TABS: 10.0000 mg | ORAL_TABLET | Freq: Three times a day (TID) | ORAL | Status: DC | PRN

## 2017-12-30 MED ORDER — ONDANSETRON HCL 4 MG PO TABS
4.0000 mg | ORAL_TABLET | Freq: Four times a day (QID) | ORAL | Status: DC | PRN
  Administered 2018-01-02: 4 mg via ORAL
  Filled 2017-12-30: qty 1

## 2017-12-30 MED ORDER — OMEGA-3-ACID ETHYL ESTERS 1 G PO CAPS
1.0000 g | ORAL_CAPSULE | Freq: Every day | ORAL | Status: DC
  Administered 2017-12-31 – 2018-01-03 (×4): 1 g via ORAL
  Filled 2017-12-30 (×5): qty 1

## 2017-12-30 MED ORDER — ONDANSETRON HCL 4 MG/2ML IJ SOLN: 4.0000 mg | Freq: Four times a day (QID) | INTRAMUSCULAR | Status: DC | PRN

## 2017-12-30 MED ORDER — ENOXAPARIN SODIUM 40 MG/0.4ML ~~LOC~~ SOLN
40.0000 mg | Freq: Every day | SUBCUTANEOUS | Status: DC
  Administered 2017-12-31: 40 mg via SUBCUTANEOUS
  Filled 2017-12-30: qty 0.4

## 2017-12-30 MED ORDER — ROSUVASTATIN CALCIUM 10 MG PO TABS: 10.0000 mg | ORAL_TABLET | Freq: Every day | ORAL | Status: DC

## 2017-12-30 MED ORDER — VITAMIN D 1000 UNITS PO TABS
1000.0000 [IU] | ORAL_TABLET | Freq: Every day | ORAL | Status: DC
  Administered 2018-01-01 – 2018-01-04 (×4): 1000 [IU] via ORAL
  Filled 2017-12-30 (×6): qty 1

## 2017-12-30 MED ORDER — ZOLPIDEM TARTRATE 5 MG PO TABS
5.0000 mg | ORAL_TABLET | Freq: Every evening | ORAL | Status: DC | PRN
  Administered 2017-12-31 – 2018-01-03 (×4): 5 mg via ORAL
  Filled 2017-12-30 (×4): qty 1

## 2017-12-30 MED ORDER — ASPIRIN EC 325 MG PO TBEC
325.0000 mg | DELAYED_RELEASE_TABLET | Freq: Every day | ORAL | Status: DC
  Administered 2017-12-31 – 2018-01-03 (×3): 325 mg via ORAL
  Filled 2017-12-30 (×4): qty 1

## 2017-12-30 MED ORDER — SODIUM CHLORIDE 0.9 % IV BOLUS
1000.0000 mL | Freq: Once | INTRAVENOUS | Status: AC
  Administered 2017-12-30: 1000 mL via INTRAVENOUS

## 2017-12-30 MED ORDER — SENNOSIDES-DOCUSATE SODIUM 8.6-50 MG PO TABS: 1.0000 | ORAL_TABLET | Freq: Every evening | ORAL | Status: DC | PRN

## 2017-12-30 MED ORDER — ACETAMINOPHEN 325 MG PO TABS
325.0000 mg | ORAL_TABLET | Freq: Four times a day (QID) | ORAL | Status: DC | PRN
  Administered 2017-12-31 – 2018-01-03 (×6): 325 mg via ORAL
  Filled 2017-12-30 (×7): qty 1

## 2017-12-30 NOTE — H&P (Signed)
History and Physical    Danielle Hebert XHB:[id number] DOB: Jun 04, 1934 DOA: 12/30/2017  Referring MD/NP/PA:   PCP: Maurice Small, MD   Patient coming from:  The patient is coming from home.  At baseline, pt is independent for most of ADL.  Chief Complaint: AMS, fall  HPI: Danielle Hebert is a 83 y.o. female with medical history significant of hyperlipidemia, rheumatoid arthritis on Enbrel injection, CKD 3, who presents with altered mental status and fall.  Per her daughter, pt was last seen in normal health 2 days ago. Relative tried to call her yesterday and did not get a hold of her. They tried several times again today and became worried, so went to check on her. Pt was found on the ground at her house, confused and covered in feces.  She has bruises in her face and forehead.  Per daughter, patient normally is alert and oriented x3.  Currently patient is oriented to place, but not to the time and place.  Patient moves all extremities normally.  No facial droop, slurred speech.  She denies any pain anywhere. No acute respiratory distress, shortness breath, nausea, vomiting, diarrhea noted.  ED Course: pt was found to have WBC 20.3, lactic acid 1.47, INR 1.14, CK 3293, ammonia level 16, Troponin 0.22, BNP 257, Tylenol less than 10, salicylate level less than 7, slightly worsening renal function, heart rate 80- 90s, tachypnea with respiration 29, oxygen saturation 89 to 95% on room air, temperature normal.  CT head is negative for acute intracranial abnormalities.  CT of C-spine is negative for for bony abnormalities.  Chest x-ray showed mild interstitial edema, cardiomegaly and trace amount of pleural effusion.  Patient is admitted to telemetry bed as inpatient.  Review of Systems: Could not be reviewed accurately due to altered mental status.   Allergy:  Allergies  Allergen Reactions  . Ciprofloxacin   . Remicade [Infliximab]     Past Medical History:  Diagnosis Date  .  Hyperlipidemia   . Rheumatoid arthritis [facility])     Past Surgical History:  Procedure Laterality Date  . CHOLECYSTECTOMY      Social History:  reports that she has never smoked. She has never used smokeless tobacco. She reports that she does not drink alcohol or use drugs.  Family History:  Family History  Problem Relation Age of Onset  . Cancer Mother        lung  . Cancer Sister        breast     Prior to Admission medications   Medication Sig Start Date End Date Taking? Authorizing Provider  cholecalciferol (VITAMIN D) 1000 UNITS tablet Take 1,000 Units by mouth daily.    [provider]  cyclobenzaprine (FLEXERIL) 10 MG tablet Take 10 mg by mouth 3 (three) times daily as needed for muscle spasms.    [provider]  etanercept (ENBREL) 25 MG injection Inject 25 mg into the skin.    [provider]  methotrexate (RHEUMATREX) 5 MG tablet Take 5 mg by mouth once a week. Caution: Chemotherapy. Protect from light.    [provider]  Omega-3 Fatty Acids (FISH OIL) 1200 MG CAPS Take by mouth.    [provider]  rosuvastatin (CRESTOR) 20 MG tablet Take 10 mg by mouth daily.    [provider]    Physical Exam: Vitals:   12/31/17 0000 12/31/17 0015 12/31/17 0030 12/31/17 0119  BP: 120/69 120/63 120/65 125/67  Pulse:   91 93  Resp:   (!)  21 20  Temp:    99.3 F (37.4 C)  TempSrc:    Oral  SpO2:   94% 96%  Weight:    70 kg  Height:    5\' 6"  (1.676 m)   General: Not in acute distress HEENT:       Eyes: PERRL, EOMI, no scleral icterus.       ENT: No discharge from the ears and nose, no pharynx injection, no tonsillar enlargement.        Neck: No JVD, no bruit, no mass felt. Heme: No neck lymph node enlargement. Cardiac: S1/S2, RRR, No murmurs, No gallops or rubs. Respiratory: No rales, wheezing, rhonchi or rubs. GI: Soft, nondistended, nontender, no rebound pain, no organomegaly, BS present. GU: No hematuria Ext: No  pitting leg edema bilaterally. 2+DP/PT pulse bilaterally. Musculoskeletal: has bilateral finger deformities, No joint redness or warmth, no limitation of ROM in spin. Skin: No rashes. Has bruises in face and forehead. Neuro: confused, oriented to place, but not to time and person, Cranial nerves II-XII grossly intact, moves all extremities. Neck is supple. Psych: Patient is not psychotic, no suicidal or hemocidal ideation.  Labs on Admission: I have personally reviewed following labs and imaging studies  CBC: Recent Labs  Lab 12/30/17 2124 12/30/17 2133 12/31/17 0012  WBC 20.3*  --  19.9*  NEUTROABS 19.1*  --   --   HGB 13.4 15.0 12.4  HCT 41.0 44.0 37.8  MCV 93.4  --  94.3  PLT 284  --  294   Basic Metabolic Panel: Recent Labs  Lab 12/30/17 2124 12/30/17 2133 12/31/17 0012  NA 132* 131* 132*  K 3.9 4.2 4.3  CL 92* 94* 95*  CO2 26  --  22  GLUCOSE 142* 138* 130*  BUN 27* 36* 27*  CREATININE 1.30* 1.10* 1.26*  CALCIUM 9.9  --  9.5   GFR: Estimated Creatinine Clearance: 32.8 mL/min (A) (by C-G formula based on SCr of 1.26 mg/dL (H)). Liver Function Tests: Recent Labs  Lab 12/30/17 2124  AST 114*  ALT 36  ALKPHOS 60  BILITOT 1.1  PROT 7.2  ALBUMIN 3.8   No results for input(s): LIPASE, AMYLASE in the last 168 hours. Recent Labs  Lab 12/31/17 0009  AMMONIA 14   Coagulation Profile: Recent Labs  Lab 12/30/17 2124  INR 1.14   Cardiac Enzymes: Recent Labs  Lab 12/30/17 2124 12/31/17 0008 12/31/17 0012  CKTOTAL 3,293*  --  2,999*  TROPONINI  --  0.21*  --    BNP (last 3 results) No results for input(s): PROBNP in the last 8760 hours. HbA1C: Recent Labs    12/31/17 0010  HGBA1C 5.8*   CBG: No results for input(s): GLUCAP in the last 168 hours. Lipid Profile: Recent Labs    12/31/17 0010  CHOL 186  HDL 69  LDLCALC 103*  TRIG 68  CHOLHDL 2.7   Thyroid Function Tests: No results for input(s): TSH, T4TOTAL, FREET4, T3FREE, THYROIDAB in the  last 72 hours. Anemia Panel: No results for input(s): VITAMINB12, FOLATE, FERRITIN, TIBC, IRON, RETICCTPCT in the last 72 hours. Urine analysis:    Component Value Date/Time   COLORURINE YELLOW 08/04/2008 1120   APPEARANCEUR CLEAR 08/04/2008 1120   LABSPEC 1.016 08/04/2008 1120   PHURINE 6.0 08/04/2008 1120   GLUCOSEU NEGATIVE 08/04/2008 1120   HGBUR NEGATIVE 08/04/2008 [place] 08/04/2008 1120   KETONESUR NEGATIVE 08/04/2008 1120   PROTEINUR NEGATIVE 08/04/2008 1120   UROBILINOGEN 0.2 08/04/2008  [place] 08/04/2008 1120   LEUKOCYTESUR  08/04/2008 1120    NEGATIVE MICROSCOPIC NOT DONE ON URINES WITH NEGATIVE PROTEIN, BLOOD, LEUKOCYTES, NITRITE, OR GLUCOSE <1000 mg/dL.   Sepsis Labs: @LABRCNTIP (procalcitonin:4,lacticidven:4) )No results found for this or any previous visit (from the past 240 hour(s)).   Radiological Exams on Admission: Dg Chest 2 View  Result Date: 12/30/2017 CLINICAL DATA:  Found down, altered EXAM: CHEST - 2 VIEW COMPARISON:  08/04/2008 FINDINGS: Trace pleural effusions. Cardiomegaly with mild diffuse interstitial opacity suggesting minimal edema. Minimal atelectasis left base. No pneumothorax. IMPRESSION: 1. Trace pleural effusions. 2. Cardiomegaly with mild diffuse interstitial opacity suggesting mild interstitial edema Electronically Signed   By: Donavan Foil M.D.   On: 12/30/2017 22:01   Ct Head Wo Contrast  Result Date: 12/30/2017 CLINICAL DATA:  Unwitnessed fall. Altered mental status with bruising to the arms, elbows, and face. EXAM: CT HEAD WITHOUT CONTRAST CT CERVICAL SPINE WITHOUT CONTRAST TECHNIQUE: Multidetector CT imaging of the head and cervical spine was performed following the standard protocol without intravenous contrast. Multiplanar CT image reconstructions of the cervical spine were also generated. COMPARISON:  None. FINDINGS: CT HEAD FINDINGS Brain: Mild diffuse cerebral atrophy. Mild ventricular dilatation  consistent with central atrophy. Low-attenuation changes in the deep white matter consistent with small vessel ischemia. No mass effect or midline shift. No abnormal extra-axial fluid collections. Gray-white matter junctions are distinct. Basal cisterns are not effaced. No acute intracranial hemorrhage. Vascular: Mild intracranial arterial vascular calcifications. Skull: Calvarium appears intact. No acute depressed skull fractures. Sinuses/Orbits: Mucosal thickening in the paranasal sinuses. No acute air-fluid levels. Mastoid air cells are clear. Other: None. CT CERVICAL SPINE FINDINGS Alignment: Normal alignment of the cervical vertebrae and facet joints. C1-2 articulation appears intact. Skull base and vertebrae: Skull base appears intact. No vertebral compression deformities. No focal bone lesion or bone destruction. Bone cortex appears intact. Soft tissues and spinal canal: No prevertebral soft tissue swelling. No abnormal soft tissue paraspinal mass or infiltration. Disc levels: Degenerative changes in the cervical spine with narrowed disc spaces and associated endplate hypertrophic changes. Degenerative changes in the facet joints and at C1-2. Upper chest: Motion artifact limits examination. Possible patchy airspace infiltrates in the right apex. Edema versus pneumonia. Other: None. IMPRESSION: 1. No acute intracranial abnormalities. Chronic atrophy and small vessel ischemic changes. 2. Normal alignment of the cervical spine. Degenerative changes. No acute displaced fractures identified. Electronically Signed   By: Lucienne Capers M.D.   On: 12/30/2017 21:56   Ct Cervical Spine Wo Contrast  Result Date: 12/30/2017 CLINICAL DATA:  Unwitnessed fall. Altered mental status with bruising to the arms, elbows, and face. EXAM: CT HEAD WITHOUT CONTRAST CT CERVICAL SPINE WITHOUT CONTRAST TECHNIQUE: Multidetector CT imaging of the head and cervical spine was performed following the standard protocol without  intravenous contrast. Multiplanar CT image reconstructions of the cervical spine were also generated. COMPARISON:  None. FINDINGS: CT HEAD FINDINGS Brain: Mild diffuse cerebral atrophy. Mild ventricular dilatation consistent with central atrophy. Low-attenuation changes in the deep white matter consistent with small vessel ischemia. No mass effect or midline shift. No abnormal extra-axial fluid collections. Gray-white matter junctions are distinct. Basal cisterns are not effaced. No acute intracranial hemorrhage. Vascular: Mild intracranial arterial vascular calcifications. Skull: Calvarium appears intact. No acute depressed skull fractures. Sinuses/Orbits: Mucosal thickening in the paranasal sinuses. No acute air-fluid levels. Mastoid air cells are clear. Other: None. CT CERVICAL SPINE FINDINGS Alignment: Normal alignment of the cervical vertebrae and facet joints.  C1-2 articulation appears intact. Skull base and vertebrae: Skull base appears intact. No vertebral compression deformities. No focal bone lesion or bone destruction. Bone cortex appears intact. Soft tissues and spinal canal: No prevertebral soft tissue swelling. No abnormal soft tissue paraspinal mass or infiltration. Disc levels: Degenerative changes in the cervical spine with narrowed disc spaces and associated endplate hypertrophic changes. Degenerative changes in the facet joints and at C1-2. Upper chest: Motion artifact limits examination. Possible patchy airspace infiltrates in the right apex. Edema versus pneumonia. Other: None. IMPRESSION: 1. No acute intracranial abnormalities. Chronic atrophy and small vessel ischemic changes. 2. Normal alignment of the cervical spine. Degenerative changes. No acute displaced fractures identified. Electronically Signed   By: Lucienne Capers M.D.   On: 12/30/2017 21:56     EKG: Independently reviewed.  Sinus rhythm, QTC 447, low voltage, LAE, early R wave progression, occasional  PVC  Assessment/Plan Principal Problem:   Acute metabolic encephalopathy Active Problems:   Rheumatoid arthritis ([place])   Fall   Elevated troponin   Abnormal LFTs   Rhabdomyolysis   Leukocytosis   CKD (chronic kidney disease), stage III (HCC)  Acute metabolic encephalopathy and fall: Etiology is not clear.  CT of head and C-spine negative for acute issues.  Patient moves all extremities normally, no signs of focal neurologic findings, less likely to have stroke.  Patient has abnormal liver function, but ammonium level normal, less likely to have hepatic encephalopathy.  Patient has leukocytosis with WBC 20.3, but no fever, no source of infection identified yet, but we need to rule out infection, such as urinalysis.  - Admit to telemetry bed as inpatient - Frequent neuro check - Swallowing screen - Follow-up urinalysis and Bx - pt/ot when able to (not ordered yet) - Keep pt NPO until mental status improves  Elevated troponin: Troponin 0.22, but patient denies any chest pain.  No respiratory distress.  Etiology is not clear.  Patient does not have leg edema, but she has elevated BNP 257, chest x-ray showed mild interstitial pulmonary edema, indicating possible CHF.  She may have demand ischemia secondary to possible CHF.  Since patient has worsening renal function, rhabdomyolysis with elevated CK 3293, and no respiratory distress, I will hold diuretics now. - Cycle CE q6 x3 and repeat EKG in the am  - Aspirin, Cresto - Risk factor stratification: will check FLP and A1C  - 2d echo - inpt cardio consult was requested via epic.  Rhabdomyolysis: CK 3293. -IVF: received 1L NS in ED -continue gentle IVF: 50 cc/h of NS -repeat CK in am  Abnormal LFTs: AST 114, ALT 36, total bilirubin 1.1.  Etiology is not clear.  Patient is on methotrexate, which may have partially contributed.  Tylenol level less than 10. -Check hepatitis panel  Leukocytosis: WBC 20.3.  No fever.  No source of infection  identified.  Neck is supple, low suspicions for meningitis. -Follow-up urinalysis, blood culture  CKD-3: Slightly worsening.  Baseline creatinine 1.22 on 08/04/2008.  Her creatinine is 1.30, BUN 27 today.  Likely due to rhabdomyolysis. -Follow-up renal function by BMP  Rheumatoid arthritis [facility]): Patient has bilateral finger deformities.  Patient still taking weekly methotrexate and q. 2-week of Enbrel injection per her daughter, but not sure when was last dose. -Hold methotrexate and Enbrel, pending to rule out infection.  Inpatient status:  # Patient requires inpatient status due to high intensity of service, high risk for further deterioration and high frequency of surveillance required.  I certify that at  the point of admission it is my clinical judgment that the patient will require inpatient hospital care spanning beyond 2 midnights from the point of admission.  . This patient has multiple chronic comorbidities including hyperlipidemia, rheumatoid arthritis on Enbrel injection, CKD 3 . Now patient has presenting symptoms include AMS and fall . The worrisome physical exam findings include AMS . The initial radiographic and laboratory data are worrisome because of leukocytosis, elevated troponin, elevated BNP, worsening renal function, elevated CK . Current medical needs: please see my assessment and plan    DVT ppx: SQ Lovenox Code Status: Full code Family Communication: Yes, patient's daughter   at bed side Disposition Plan:  Anticipate discharge back to previous home environment Consults called: None Admission status:  Inpatient/tele     Date of Service 12/31/2017    Ivor Costa [facility] Pager [phone]  If 7PM-7AM, please contact night-coverage [email or web address] Password [email or web address] 12/31/2017, 2:34 AM

## 2017-12-30 NOTE — ED Notes (Signed)
Purewick placed. Family in room. Pt resting.

## 2017-12-30 NOTE — ED Provider Notes (Signed)
[facility] Provider Note   CSN: [id number] Arrival date & time: 12/30/17  1947     History   Chief Complaint Chief Complaint  Patient presents with  . Fall    HPI Danielle Hebert is a 83 y.o. female.  83 year old female with past medical history including rheumatoid arthritis and hyperlipidemia who presents with fall.  Family reports that she was last seen normal 2 nights ago and was in her usual state of health.  Relative tried to call her yesterday and did not get a hold of her.  She tried several times again today and became worried so she went to check on her.  They found her on the ground at her house, confused and covered in feces.  It is unclear how long she was laying there.  She has been disoriented which is unusual for her. She denies any areas of pain.  LEVEL 5 CAVEAT DUE TO AMS  The history is provided by a relative.    Past Medical History:  Diagnosis Date  . Hyperlipidemia   . Rheumatoid arthritis [facility])     Patient Active Problem List   Diagnosis Date Noted  . Fall 12/30/2017  . Elevated troponin 12/30/2017  . Abnormal LFTs 12/30/2017  . Rhabdomyolysis 12/30/2017  . Leukocytosis 12/30/2017  . Acute metabolic encephalopathy 62/69/4854  . Hyperlipidemia   . Rheumatoid arthritis [facility])     Past Surgical History:  Procedure Laterality Date  . CHOLECYSTECTOMY       OB History   None      Home Medications    Prior to Admission medications   Medication Sig Start Date End Date Taking? Authorizing Provider  cholecalciferol (VITAMIN D) 1000 UNITS tablet Take 1,000 Units by mouth daily.    [provider]  cyclobenzaprine (FLEXERIL) 10 MG tablet Take 10 mg by mouth 3 (three) times daily as needed for muscle spasms.    [provider]  etanercept (ENBREL) 25 MG injection Inject 25 mg into the skin.    [provider]  methotrexate (RHEUMATREX) 5 MG tablet Take 5 mg by mouth once a  week. Caution: Chemotherapy. Protect from light.    [provider]  Omega-3 Fatty Acids (FISH OIL) 1200 MG CAPS Take by mouth.    [provider]  rosuvastatin (CRESTOR) 20 MG tablet Take 10 mg by mouth daily.    [provider]    Family History Family History  Problem Relation Age of Onset  . Cancer Mother        lung  . Cancer Sister        breast    Social History Social History   Tobacco Use  . Smoking status: Never Smoker  . Smokeless tobacco: Never Used  Substance Use Topics  . Alcohol use: No  . Drug use: No     Allergies   Ciprofloxacin and Remicade [infliximab]   Review of Systems Review of Systems  Unable to perform ROS: Mental status change     Physical Exam Updated Vital Signs BP 114/77   Pulse 93   Temp 98 F (36.7 C) (Oral)   Resp (!) 22   SpO2 95%   Physical Exam  Constitutional: She appears well-developed and well-nourished. No distress.  HENT:  Head: Normocephalic.  Bruising L face and chin, non-tender  Eyes: Pupils are equal, round, and reactive to light. Conjunctivae are normal.  Bruising below L eye  Neck:  In soft collar  Cardiovascular: Normal  rate, regular rhythm and normal heart sounds.  No murmur heard. Pulmonary/Chest: Effort normal and breath sounds normal. She exhibits no tenderness.  Abdominal: Soft. Bowel sounds are normal. She exhibits no distension. There is no tenderness.  Musculoskeletal: Normal range of motion. She exhibits no edema or tenderness.  Normal ROM x all 4 ext with no joint swelling or pain; ecchymoses b/l forearms  Neurological: She is alert. No sensory deficit.  Fluent speech; oriented x 2, able to follow basic commands with mild confusion  Skin: Skin is warm and dry.  Nursing note and vitals reviewed.    ED Treatments / Results  Labs (all labs ordered are listed, but only abnormal results are displayed) Labs Reviewed  COMPREHENSIVE METABOLIC PANEL - Abnormal; Notable  for the following components:      Result Value   Sodium 132 (*)    Chloride 92 (*)    Glucose, Bld 142 (*)    BUN 27 (*)    Creatinine, Ser 1.30 (*)    AST 114 (*)    GFR calc non Af Amer 37 (*)    GFR calc Af Amer 43 (*)    All other components within normal limits  ACETAMINOPHEN LEVEL - Abnormal; Notable for the following components:   Acetaminophen (Tylenol), Serum <10 (*)    All other components within normal limits  CBC WITH DIFFERENTIAL/PLATELET - Abnormal; Notable for the following components:   WBC 20.3 (*)    Neutro Abs 19.1 (*)    Lymphs Abs 0.3 (*)    Abs Immature Granulocytes 0.09 (*)    All other components within normal limits  CK - Abnormal; Notable for the following components:   Total CK 3,293 (*)    All other components within normal limits  I-STAT TROPONIN, ED - Abnormal; Notable for the following components:   Troponin i, poc 0.22 (*)    All other components within normal limits  I-STAT CHEM 8, ED - Abnormal; Notable for the following components:   Sodium 131 (*)    Chloride 94 (*)    BUN 36 (*)    Creatinine, Ser 1.10 (*)    Glucose, Bld 138 (*)    Calcium, Ion 1.14 (*)    All other components within normal limits  ETHANOL  SALICYLATE LEVEL  PROTIME-INR  URINALYSIS, ROUTINE W REFLEX MICROSCOPIC  BRAIN NATRIURETIC PEPTIDE  I-STAT CG4 LACTIC ACID, ED  I-STAT CG4 LACTIC ACID, ED    EKG EKG Interpretation  Date/Time:  Tuesday December 30 2017 20:05:40 EDT Ventricular Rate:  96 PR Interval:    QRS Duration: 92 QT Interval:  353 QTC Calculation: 447 R Axis:   -14 Text Interpretation:  Sinus rhythm Ventricular premature complex Low voltage, precordial leads RSR' in V1 or V2, right VCD or RVH Baseline wander in lead(s) V3 occasional PVC otherwise similar to previous Confirmed by Theotis Burrow [phone]) on 12/30/2017 9:46:37 PM   Radiology Dg Chest 2 View  Result Date: 12/30/2017 CLINICAL DATA:  Found down, altered EXAM: CHEST - 2 VIEW  COMPARISON:  08/04/2008 FINDINGS: Trace pleural effusions. Cardiomegaly with mild diffuse interstitial opacity suggesting minimal edema. Minimal atelectasis left base. No pneumothorax. IMPRESSION: 1. Trace pleural effusions. 2. Cardiomegaly with mild diffuse interstitial opacity suggesting mild interstitial edema Electronically Signed   By: Donavan Foil M.D.   On: 12/30/2017 22:01   Ct Head Wo Contrast  Result Date: 12/30/2017 CLINICAL DATA:  Unwitnessed fall. Altered mental status with bruising to the arms, elbows, and face. EXAM:  CT HEAD WITHOUT CONTRAST CT CERVICAL SPINE WITHOUT CONTRAST TECHNIQUE: Multidetector CT imaging of the head and cervical spine was performed following the standard protocol without intravenous contrast. Multiplanar CT image reconstructions of the cervical spine were also generated. COMPARISON:  None. FINDINGS: CT HEAD FINDINGS Brain: Mild diffuse cerebral atrophy. Mild ventricular dilatation consistent with central atrophy. Low-attenuation changes in the deep white matter consistent with small vessel ischemia. No mass effect or midline shift. No abnormal extra-axial fluid collections. Gray-white matter junctions are distinct. Basal cisterns are not effaced. No acute intracranial hemorrhage. Vascular: Mild intracranial arterial vascular calcifications. Skull: Calvarium appears intact. No acute depressed skull fractures. Sinuses/Orbits: Mucosal thickening in the paranasal sinuses. No acute air-fluid levels. Mastoid air cells are clear. Other: None. CT CERVICAL SPINE FINDINGS Alignment: Normal alignment of the cervical vertebrae and facet joints. C1-2 articulation appears intact. Skull base and vertebrae: Skull base appears intact. No vertebral compression deformities. No focal bone lesion or bone destruction. Bone cortex appears intact. Soft tissues and spinal canal: No prevertebral soft tissue swelling. No abnormal soft tissue paraspinal mass or infiltration. Disc levels:  Degenerative changes in the cervical spine with narrowed disc spaces and associated endplate hypertrophic changes. Degenerative changes in the facet joints and at C1-2. Upper chest: Motion artifact limits examination. Possible patchy airspace infiltrates in the right apex. Edema versus pneumonia. Other: None. IMPRESSION: 1. No acute intracranial abnormalities. Chronic atrophy and small vessel ischemic changes. 2. Normal alignment of the cervical spine. Degenerative changes. No acute displaced fractures identified. Electronically Signed   By: Lucienne Capers M.D.   On: 12/30/2017 21:56   Ct Cervical Spine Wo Contrast  Result Date: 12/30/2017 CLINICAL DATA:  Unwitnessed fall. Altered mental status with bruising to the arms, elbows, and face. EXAM: CT HEAD WITHOUT CONTRAST CT CERVICAL SPINE WITHOUT CONTRAST TECHNIQUE: Multidetector CT imaging of the head and cervical spine was performed following the standard protocol without intravenous contrast. Multiplanar CT image reconstructions of the cervical spine were also generated. COMPARISON:  None. FINDINGS: CT HEAD FINDINGS Brain: Mild diffuse cerebral atrophy. Mild ventricular dilatation consistent with central atrophy. Low-attenuation changes in the deep white matter consistent with small vessel ischemia. No mass effect or midline shift. No abnormal extra-axial fluid collections. Gray-white matter junctions are distinct. Basal cisterns are not effaced. No acute intracranial hemorrhage. Vascular: Mild intracranial arterial vascular calcifications. Skull: Calvarium appears intact. No acute depressed skull fractures. Sinuses/Orbits: Mucosal thickening in the paranasal sinuses. No acute air-fluid levels. Mastoid air cells are clear. Other: None. CT CERVICAL SPINE FINDINGS Alignment: Normal alignment of the cervical vertebrae and facet joints. C1-2 articulation appears intact. Skull base and vertebrae: Skull base appears intact. No vertebral compression deformities. No  focal bone lesion or bone destruction. Bone cortex appears intact. Soft tissues and spinal canal: No prevertebral soft tissue swelling. No abnormal soft tissue paraspinal mass or infiltration. Disc levels: Degenerative changes in the cervical spine with narrowed disc spaces and associated endplate hypertrophic changes. Degenerative changes in the facet joints and at C1-2. Upper chest: Motion artifact limits examination. Possible patchy airspace infiltrates in the right apex. Edema versus pneumonia. Other: None. IMPRESSION: 1. No acute intracranial abnormalities. Chronic atrophy and small vessel ischemic changes. 2. Normal alignment of the cervical spine. Degenerative changes. No acute displaced fractures identified. Electronically Signed   By: Lucienne Capers M.D.   On: 12/30/2017 21:56    Procedures .Critical Care Performed by: Sharlett Iles, MD Authorized by: Sharlett Iles, MD   Critical care provider statement:  Critical care time (minutes):  30   Critical care time was exclusive of:  Separately billable procedures and treating other patients   Critical care was necessary to treat or prevent imminent or life-threatening deterioration of the following conditions:  Dehydration and cardiac failure   Critical care was time spent personally by me on the following activities:  Development of treatment plan with patient or surrogate, examination of patient, obtaining history from patient or surrogate, ordering and performing treatments and interventions, ordering and review of laboratory studies, ordering and review of radiographic studies and re-evaluation of patient's condition   (including critical care time)  Medications Ordered in ED Medications  sodium chloride 0.9 % bolus 1,000 mL (1,000 mLs Intravenous New Bag/Given 12/30/17 2231)     Initial Impression / Assessment and Plan / ED Course  I have reviewed the triage vital signs and the nursing notes.  Pertinent labs &  imaging results that were available during my care of the patient were reviewed by me and considered in my medical decision making (see chart for details).     Comfortable on exam, stable vital signs.  No focal areas of tenderness.  Mild bruising on arms and face but moving all 4 extremities equally.  Lab work shows normal lactate, troponin 0 0.22, creatinine 1.3, sodium 132, chloride 92, BUN 27, AST 114, WBC 20 which may be stress response, CK 3293.  CT head and neck negative for acute injury.  Chest x-ray shows trace pleural effusions and mild interstitial edema.  I have added on a BNP.  She may have a component of CHF but she will also require IV fluids to prevent kidney failure from rhabdomyolysis.  I have ordered 1 L of fluids in the ED.  No complaints of chest pain and EKG without acute ischemic changes. Discussed w/ [facility], Dr. Blaine Hamper, who will admit for further care.  Final Clinical Impressions(s) / ED Diagnoses   Final diagnoses:  Fall, initial encounter  Traumatic rhabdomyolysis, initial encounter [facility])  Elevated troponin    ED Discharge Orders    None       Sanvika Cuttino, Wenda Overland, MD 12/30/17 2307

## 2017-12-30 NOTE — ED Triage Notes (Signed)
Patient arrived from home via [facility]. Patient lives alone and is usually independent, found down by neighbors today, unsure when she fell but last they had seen her was 2 days ago. Patient unable to recall that she fell. Found her covered in urine and feces, AMS. Has bruising to arms and elbows and on face. Patient alert to self only.

## 2017-12-31 ENCOUNTER — Encounter (HOSPITAL_COMMUNITY): Payer: Self-pay | Admitting: Cardiology

## 2017-12-31 ENCOUNTER — Inpatient Hospital Stay (HOSPITAL_COMMUNITY): Payer: Medicare Other

## 2017-12-31 DIAGNOSIS — N183 Chronic kidney disease, stage 3 unspecified: Secondary | ICD-10-CM | POA: Diagnosis present

## 2017-12-31 DIAGNOSIS — I480 Paroxysmal atrial fibrillation: Secondary | ICD-10-CM

## 2017-12-31 DIAGNOSIS — M069 Rheumatoid arthritis, unspecified: Secondary | ICD-10-CM

## 2017-12-31 DIAGNOSIS — R945 Abnormal results of liver function studies: Secondary | ICD-10-CM

## 2017-12-31 DIAGNOSIS — D72829 Elevated white blood cell count, unspecified: Secondary | ICD-10-CM

## 2017-12-31 DIAGNOSIS — W19XXXA Unspecified fall, initial encounter: Secondary | ICD-10-CM

## 2017-12-31 DIAGNOSIS — I5030 Unspecified diastolic (congestive) heart failure: Secondary | ICD-10-CM

## 2017-12-31 DIAGNOSIS — G9341 Metabolic encephalopathy: Secondary | ICD-10-CM

## 2017-12-31 DIAGNOSIS — R7989 Other specified abnormal findings of blood chemistry: Secondary | ICD-10-CM

## 2017-12-31 DIAGNOSIS — W19XXXD Unspecified fall, subsequent encounter: Secondary | ICD-10-CM

## 2017-12-31 DIAGNOSIS — T796XXA Traumatic ischemia of muscle, initial encounter: Principal | ICD-10-CM

## 2017-12-31 LAB — CBC
HEMATOCRIT: 37.8 % (ref 36.0–46.0)
Hemoglobin: 12.4 g/dL (ref 12.0–15.0)
MCH: 30.9 pg (ref 26.0–34.0)
MCHC: 32.8 g/dL (ref 30.0–36.0)
MCV: 94.3 fL (ref 80.0–100.0)
NRBC: 0 % (ref 0.0–0.2)
Platelets: 263 10*3/uL (ref 150–400)
RBC: 4.01 MIL/uL (ref 3.87–5.11)
RDW: 14 % (ref 11.5–15.5)
WBC: 19.9 10*3/uL — ABNORMAL HIGH (ref 4.0–10.5)

## 2017-12-31 LAB — BASIC METABOLIC PANEL
ANION GAP: 15 (ref 5–15)
ANION GAP: 9 (ref 5–15)
BUN: 27 mg/dL — ABNORMAL HIGH (ref 8–23)
BUN: 25 mg/dL — ABNORMAL HIGH (ref 8–23)
CALCIUM: 9.5 mg/dL (ref 8.9–10.3)
CHLORIDE: 95 mmol/L — AB (ref 98–111)
CHLORIDE: 97 mmol/L — AB (ref 98–111)
CO2: 22 mmol/L (ref 22–32)
CO2: 30 mmol/L (ref 22–32)
Calcium: 8.9 mg/dL (ref 8.9–10.3)
Creatinine, Ser: 1.26 mg/dL — ABNORMAL HIGH (ref 0.44–1.00)
Creatinine, Ser: 1.72 mg/dL — ABNORMAL HIGH (ref 0.44–1.00)
GFR calc Af Amer: 31 mL/min — ABNORMAL LOW (ref >60)
GFR calc non Af Amer: 39 mL/min — ABNORMAL LOW (ref >60)
GFR calc non Af Amer: 27 mL/min — ABNORMAL LOW (ref >60)
GFR, EST AFRICAN AMERICAN: 45 mL/min — AB (ref >60)
Glucose, Bld: 130 mg/dL — ABNORMAL HIGH (ref 70–99)
Glucose, Bld: 212 mg/dL — ABNORMAL HIGH (ref 70–99)
POTASSIUM: 2.9 mmol/L — AB (ref 3.5–5.1)
Potassium: 4.3 mmol/L (ref 3.5–5.1)
SODIUM: 136 mmol/L (ref 135–145)
Sodium: 132 mmol/L — ABNORMAL LOW (ref 135–145)

## 2017-12-31 LAB — URINALYSIS, ROUTINE W REFLEX MICROSCOPIC
Bilirubin Urine: NEGATIVE
Glucose, UA: NEGATIVE mg/dL
Ketones, ur: 5 mg/dL — AB
NITRITE: NEGATIVE
Protein, ur: NEGATIVE mg/dL
SPECIFIC GRAVITY, URINE: 1.018 (ref 1.005–1.030)
pH: 5 (ref 5.0–8.0)

## 2017-12-31 LAB — ECHOCARDIOGRAM COMPLETE
Height: 66 in
Weight: 2469.15 oz

## 2017-12-31 LAB — HEMOGLOBIN A1C
HEMOGLOBIN A1C: 5.8 % — AB (ref 4.8–5.6)
Mean Plasma Glucose: 119.76 mg/dL

## 2017-12-31 LAB — PROCALCITONIN: Procalcitonin: 2.54 ng/mL

## 2017-12-31 LAB — MAGNESIUM: MAGNESIUM: 1.8 mg/dL (ref 1.7–2.4)

## 2017-12-31 LAB — LIPID PANEL
CHOLESTEROL: 186 mg/dL (ref 0–200)
HDL: 69 mg/dL (ref >40)
LDL Cholesterol: 103 mg/dL — ABNORMAL HIGH (ref 0–99)
Total CHOL/HDL Ratio: 2.7 RATIO
Triglycerides: 68 mg/dL (ref <150)
VLDL: 14 mg/dL (ref 0–40)

## 2017-12-31 LAB — CK: Total CK: 2999 U/L — ABNORMAL HIGH (ref 38–234)

## 2017-12-31 LAB — LACTIC ACID, PLASMA: Lactic Acid, Venous: 1.2 mmol/L (ref 0.5–1.9)

## 2017-12-31 LAB — I-STAT CG4 LACTIC ACID, ED: Lactic Acid, Venous: 1.24 mmol/L (ref 0.5–1.9)

## 2017-12-31 LAB — TROPONIN I
TROPONIN I: 0.13 ng/mL — AB (ref <0.03)
TROPONIN I: 0.21 ng/mL — AB (ref <0.03)

## 2017-12-31 LAB — TSH: TSH: 1.563 u[IU]/mL (ref 0.350–4.500)

## 2017-12-31 LAB — BRAIN NATRIURETIC PEPTIDE: B Natriuretic Peptide: 257 pg/mL — ABNORMAL HIGH (ref 0.0–100.0)

## 2017-12-31 LAB — AMMONIA: AMMONIA: 14 umol/L (ref 9–35)

## 2017-12-31 MED ORDER — HEPARIN BOLUS VIA INFUSION
3000.0000 [IU] | Freq: Once | INTRAVENOUS | Status: DC
  Filled 2017-12-31: qty 3000

## 2017-12-31 MED ORDER — SODIUM CHLORIDE 0.9 % IV BOLUS
250.0000 mL | Freq: Once | INTRAVENOUS | Status: AC
  Administered 2017-12-31: 250 mL via INTRAVENOUS

## 2017-12-31 MED ORDER — POTASSIUM CHLORIDE CRYS ER 20 MEQ PO TBCR
40.0000 meq | EXTENDED_RELEASE_TABLET | ORAL | Status: AC
  Administered 2017-12-31 – 2018-01-01 (×2): 40 meq via ORAL
  Filled 2017-12-31 (×2): qty 2

## 2017-12-31 MED ORDER — DILTIAZEM LOAD VIA INFUSION
10.0000 mg | Freq: Once | INTRAVENOUS | Status: AC
  Administered 2017-12-31: 10 mg via INTRAVENOUS
  Filled 2017-12-31: qty 10

## 2017-12-31 MED ORDER — DILTIAZEM HCL-DEXTROSE 100-5 MG/100ML-% IV SOLN (PREMIX)
5.0000 mg/h | INTRAVENOUS | Status: DC
  Administered 2017-12-31 – 2018-01-02 (×2): 5 mg/h via INTRAVENOUS
  Filled 2017-12-31 (×2): qty 100

## 2017-12-31 MED ORDER — HEPARIN BOLUS VIA INFUSION
1000.0000 [IU] | Freq: Once | INTRAVENOUS | Status: AC
  Administered 2017-12-31: 1000 [IU] via INTRAVENOUS
  Filled 2017-12-31: qty 1000

## 2017-12-31 MED ORDER — SODIUM CHLORIDE 0.9 % IV BOLUS: 250.0000 mL | Freq: Once | INTRAVENOUS | Status: DC

## 2017-12-31 MED ORDER — HEPARIN (PORCINE) IN NACL 100-0.45 UNIT/ML-% IJ SOLN
1150.0000 [IU]/h | INTRAMUSCULAR | Status: DC
  Administered 2017-12-31 – 2018-01-02 (×4): 1050 [IU]/h via INTRAVENOUS
  Filled 2017-12-31 (×4): qty 250

## 2017-12-31 MED ORDER — HEPARIN BOLUS VIA INFUSION
2000.0000 [IU] | Freq: Once | INTRAVENOUS | Status: DC
  Filled 2017-12-31: qty 2000

## 2017-12-31 MED ORDER — SODIUM CHLORIDE 0.9 % IV SOLN
INTRAVENOUS | Status: DC
  Administered 2017-12-31 – 2018-01-01 (×3): via INTRAVENOUS

## 2017-12-31 NOTE — ED Notes (Signed)
Attempted report to floor, left call back number.

## 2017-12-31 NOTE — Progress Notes (Signed)
ANTICOAGULATION CONSULT NOTE - Initial Consult  Pharmacy Consult for Heparin Indication: atrial fibrillation  Allergies  Allergen Reactions  . Ciprofloxacin   . Remicade [Infliximab]     Patient Measurements: Height: 5\' 6"  (167.6 cm) Weight: 154 lb 5.2 oz (70 kg) IBW/kg (Calculated) : 59.3 Heparin Dosing Weight: 70 kg  Vital Signs: Temp: 98.1 F (36.7 C) (10/16 1207) Temp Source: Oral (10/16 1207) BP: 89/62 (10/16 1550) Pulse Rate: 93 (10/16 1207)  Labs: Recent Labs    12/30/17 2124 12/30/17 2133 12/31/17 0008 12/31/17 0012 12/31/17 1153  HGB 13.4 15.0  --  12.4  --   HCT 41.0 44.0  --  37.8  --   PLT 284  --   --  263  --   LABPROT 14.5  --   --   --   --   INR 1.14  --   --   --   --   CREATININE 1.30* 1.10*  --  1.26*  --   CKTOTAL 3,293*  --   --  2,999*  --   TROPONINI  --   --  0.21*  --  0.13*    Estimated Creatinine Clearance: 32.8 mL/min (A) (by C-G formula based on SCr of 1.26 mg/dL (H)).   Medical History: Past Medical History:  Diagnosis Date  . Hyperlipidemia   . Rheumatoid arthritis (HCC)     Medications:  Medications Prior to Admission  Medication Sig Dispense Refill Last Dose  . ALPRAZolam (XANAX) 0.5 MG tablet Take 0.5 mg by mouth 2 (two) times daily.  1 12/28/2017  . cetirizine (ZYRTEC) 10 MG tablet Take 10 mg by mouth daily as needed.  11 12/28/2017  . cholecalciferol (VITAMIN D) 1000 UNITS tablet Take 1,000 Units by mouth daily.   12/28/2017  . cycloSPORINE (RESTASIS MULTIDOSE) 0.05 % ophthalmic emulsion Place 1 drop into both eyes 2 (two) times daily.   12/28/2017  . etanercept (ENBREL) 25 MG injection Inject 25 mg into the skin.   11/25/2017  . fluticasone (FLONASE) 50 MCG/ACT nasal spray Place 1 spray into both nostrils daily.  11 62/37/6283  . folic acid (FOLVITE) 1 MG tablet Take 1 mg by mouth daily.  0 12/28/2017  . methotrexate 2.5 MG tablet Take 12.5 mg by mouth once a week.  0 12/26/2017  . metroNIDAZOLE (METROCREAM) 0.75 %  cream Apply 1 application topically 2 (two) times daily. Apply cream to affected area  3 Past Week at Unknown time  . Omega-3 Fatty Acids (FISH OIL) 1200 MG CAPS Take by mouth.   12/28/2017  . omeprazole (PRILOSEC) 40 MG capsule Take 40 mg by mouth daily.  11 12/28/2017  . tiZANidine (ZANAFLEX) 4 MG tablet Take 4 mg by mouth 2 (two) times daily as needed for muscle spasms.  3 unk at prn  . cyclobenzaprine (FLEXERIL) 10 MG tablet Take 10 mg by mouth 3 (three) times daily as needed for muscle spasms.   Not Taking at prn  . methotrexate (RHEUMATREX) 5 MG tablet Take 5 mg by mouth once a week. Caution: Chemotherapy. Protect from light.   Not Taking at Unknown time  . rosuvastatin (CRESTOR) 20 MG tablet Take 10 mg by mouth daily.   Not Taking at Unknown time    Assessment:  83 y.o. female with new onset AFib, cardiology evaluating for elevated troponin after admission for being found down at home for 2 days with altered mental status. Patient was on prophylactic dose Lovenox 40 mg, last dose today at  noon. No anticoagulation PTA. CBC wnl.  Goal of Therapy:  Heparin level 0.3-0.7 units/ml Monitor platelets by anticoagulation protocol: Yes   Plan:  Give 1000 units bolus x 1 Start heparin infusion at 1050 units/hr Check anti-Xa level in 8 hours and daily while on heparin Continue to monitor H&H and platelets   Thank you for allowing [place] to participate in this patients care.   Jens Som, PharmD Please utilize [facility] (under [place]) for appropriate number for your unit pharmacist. 12/31/2017 4:16 PM

## 2017-12-31 NOTE — Consult Note (Addendum)
Cardiology Consultation:   Patient ID: Danielle Hebert MRN: [id number]; DOB: Aug 07, 1934  Admit date: 12/30/2017 Date of Consult: 12/31/2017  Primary Care Provider: Maurice Small, MD Primary Cardiologist: NEW Primary Electrophysiologist:  None    Patient Profile:   Danielle Hebert is a 83 y.o. female with a hx of RA and HLD who is being seen today for the evaluation of elevated troponin after admit for being found down at home, and was down for 2 days, altered mental status, at the request of Dr. Ree Kida.  History of Present Illness:   [name] with no prior cardiac hx was found on floor or ground and may have been there for 2 days.  She was confused.  She has been living independently.  Hx of rt carotid tortuosity and vertebral tortuosity   Pt has no recollection of her fall or events surrounding the fall.  Her family tells me she did not complain of chest pain or SOB.  She has had some dizziness.    EKG SR at 96 + PVC and no acute changes.  I personally reviewed. Troponin 0.22; 0.21;13  CK 3293, and 2999 BNP 257 Na 132, K+ 4.3 Cr 1.26  T chol 186 HDL 69 LDL 103 WBC 19.9  Echo today EF 55-60% mild focal basal hypertrophy G2DD PA pk pressure 22 mmHg trivial pericardial effusion  Tele SR I personally reviewed   Currently resting no complaints except soreness all over.     Past Medical History:  Diagnosis Date  . Hyperlipidemia   . Rheumatoid arthritis [facility])     Past Surgical History:  Procedure Laterality Date  . CHOLECYSTECTOMY       Home Medications:  Prior to Admission medications   Medication Sig Start Date End Date Taking? Authorizing Provider  ALPRAZolam Duanne Moron) 0.5 MG tablet Take 0.5 mg by mouth 2 (two) times daily. 12/04/17  Yes [provider]  cetirizine (ZYRTEC) 10 MG tablet Take 10 mg by mouth daily as needed. 12/04/17  Yes [provider]  cholecalciferol (VITAMIN D) 1000 UNITS tablet Take 1,000 Units by mouth daily.   Yes  [provider]  cycloSPORINE (RESTASIS MULTIDOSE) 0.05 % ophthalmic emulsion Place 1 drop into both eyes 2 (two) times daily.   Yes [provider]  etanercept (ENBREL) 25 MG injection Inject 25 mg into the skin.   Yes [provider]  fluticasone (FLONASE) 50 MCG/ACT nasal spray Place 1 spray into both nostrils daily. 12/06/17  Yes [provider]  folic acid (FOLVITE) 1 MG tablet Take 1 mg by mouth daily. 10/04/17  Yes [provider]  methotrexate 2.5 MG tablet Take 12.5 mg by mouth once a week. 12/22/17  Yes [provider]  metroNIDAZOLE (METROCREAM) 0.75 % cream Apply 1 application topically 2 (two) times daily. Apply cream to affected area 12/22/17  Yes [provider]  Omega-3 Fatty Acids (FISH OIL) 1200 MG CAPS Take by mouth.   Yes [provider]  omeprazole (PRILOSEC) 40 MG capsule Take 40 mg by mouth daily. 12/21/17  Yes [provider]  tiZANidine (ZANAFLEX) 4 MG tablet Take 4 mg by mouth 2 (two) times daily as needed for muscle spasms. 09/25/17  Yes [provider]  cyclobenzaprine (FLEXERIL) 10 MG tablet Take 10 mg by mouth 3 (three) times daily as needed for muscle spasms.    [provider]  methotrexate (RHEUMATREX) 5 MG tablet Take 5 mg by mouth once a week. Caution: Chemotherapy. Protect from light.  [provider]  rosuvastatin (CRESTOR) 20 MG tablet Take 10 mg by mouth daily.    [provider]    Inpatient Medications: Scheduled Meds: . aspirin EC  325 mg Oral Daily  . cholecalciferol  1,000 Units Oral Daily  . enoxaparin (LOVENOX) injection  40 mg Subcutaneous Daily  . omega-3 acid ethyl esters  1 g Oral Daily   Continuous Infusions: . sodium chloride 50 mL/hr at 12/31/17 0354   PRN Meds: acetaminophen, cyclobenzaprine, ondansetron **OR** ondansetron (ZOFRAN) IV, senna-docusate, zolpidem  Allergies:    Allergies  Allergen Reactions  . Ciprofloxacin    . Remicade [Infliximab]     Social History:   Social History   Socioeconomic History  . Marital status: Married    Spouse name: Not on file  . Number of children: Not on file  . Years of education: Not on file  . Highest education level: Not on file  Occupational History  . Not on file  Social Needs  . Financial resource strain: Not on file  . Food insecurity:    Worry: Not on file    Inability: Not on file  . Transportation needs:    Medical: Not on file    Non-medical: Not on file  Tobacco Use  . Smoking status: Never Smoker  . Smokeless tobacco: Never Used  Substance and Sexual Activity  . Alcohol use: No  . Drug use: No  . Sexual activity: Not on file  Lifestyle  . Physical activity:    Days per week: Not on file    Minutes per session: Not on file  . Stress: Not on file  Relationships  . Social connections:    Talks on phone: Not on file    Gets together: Not on file    Attends religious service: Not on file    Active member of club or organization: Not on file    Attends meetings of clubs or organizations: Not on file    Relationship status: Not on file  . Intimate partner violence:    Fear of current or ex partner: Not on file    Emotionally abused: Not on file    Physically abused: Not on file    Forced sexual activity: Not on file  Other Topics Concern  . Not on file  Social History Narrative  . Not on file    Family History:   No FH of CAD Family History  Problem Relation Age of Onset  . Cancer Mother        lung  . Cancer Sister        breast     ROS:  Please see the history of present illness.  General:no colds or fevers, no weight changes Skin:no rashes or ulcers HEENT:no blurred vision, no congestion CV:see HPI PUL:see HPI GI:no diarrhea constipation or melena, no indigestion GU:no hematuria, no dysuria MS:no joint pain, no claudication Neuro:no syncope, no lightheadedness Endo:no diabetes, no thyroid disease  All other ROS  reviewed and negative.     Physical Exam/Data:   Vitals:   12/31/17 0119 12/31/17 0317 12/31/17 0821 12/31/17 1207  BP: 125/67 104/68 (!) 110/54 122/62  Pulse: 93 86 95 93  Resp: 20 20 17 18   Temp: 99.3 F (37.4 C) 98.5 F (36.9 C) 99.1 F (37.3 C) 98.1 F (36.7 C)  TempSrc: Oral Oral Oral Oral  SpO2: 96% 98% 94% 94%  Weight: 70 kg     Height: 5\' 6"  (1.676 m)  Intake/Output Summary (Last 24 hours) at 12/31/2017 1414 Last data filed at 12/31/2017 0014 Gross per 24 hour  Intake 1250 ml  Output -  Net 1250 ml   Filed Weights   12/31/17 0119  Weight: 70 kg   Body mass index is 24.91 kg/m.  General:  Well nourished, well developed, in no acute distress HEENT: normal, + bruising on Lt chin with swelling Lymph: no adenopathy Neck: no JVD Endocrine:  No thryomegaly Vascular: Rt carotid bruit, none on left; FA pulses 2+ bilaterally without bruits  Cardiac:  normal S1, S2; RRR; no murmur gallup rub or click Lungs:  Scattered rales to auscultation bilaterally, no wheezing, rhonchi or rales  Abd: soft, nontender, no hepatomegaly  Ext: no edema Musculoskeletal:  No deformities, BUE and BLE strength normal and equal Skin: warm and dry  Neuro:  Alert and oriented to person, no memory of event, no focal abnormalities noted Psych:  Normal affect     Relevant CV Studies: Echo 12/31/17 Study Conclusions  - Left ventricle: The cavity size was normal. There was mild focal   basal hypertrophy of the septum. Systolic function was normal.   The estimated ejection fraction was in the range of 55% to 60%.   Wall motion was normal; there were no regional wall motion   abnormalities. Features are consistent with a pseudonormal left   ventricular filling pattern, with concomitant abnormal relaxation   and increased filling pressure (grade 2 diastolic dysfunction).   Doppler parameters are consistent with high ventricular filling   pressure. - Aortic valve: Transvalvular  velocity was within the normal range.   There was no stenosis. There was no regurgitation. - Mitral valve: Transvalvular velocity was within the normal range.   There was no evidence for stenosis. There was trivial   regurgitation. - Right ventricle: The cavity size was normal. Wall thickness was   normal. Systolic function was normal. - Tricuspid valve: There was trivial regurgitation. - Pulmonary arteries: Systolic pressure was within the normal   range. PA peak pressure: 22 mm Hg (S). - Pericardium, extracardiac: A trivial pericardial effusion was   identified. - Global longitudinal strain -18.1% (normal).   Laboratory Data:  Chemistry Recent Labs  Lab 12/30/17 2124 12/30/17 2133 12/31/17 0012  NA 132* 131* 132*  K 3.9 4.2 4.3  CL 92* 94* 95*  CO2 26  --  22  GLUCOSE 142* 138* 130*  BUN 27* 36* 27*  CREATININE 1.30* 1.10* 1.26*  CALCIUM 9.9  --  9.5  GFRNONAA 37*  --  39*  GFRAA 43*  --  45*  ANIONGAP 14  --  15    Recent Labs  Lab 12/30/17 2124  PROT 7.2  ALBUMIN 3.8  AST 114*  ALT 36  ALKPHOS 60  BILITOT 1.1   Hematology Recent Labs  Lab 12/30/17 2124 12/30/17 2133 12/31/17 0012  WBC 20.3*  --  19.9*  RBC 4.39  --  4.01  HGB 13.4 15.0 12.4  HCT 41.0 44.0 37.8  MCV 93.4  --  94.3  MCH 30.5  --  30.9  MCHC 32.7  --  32.8  RDW 13.8  --  14.0  PLT 284  --  263   Cardiac Enzymes Recent Labs  Lab 12/31/17 0008 12/31/17 1153  TROPONINI 0.21* 0.13*    Recent Labs  Lab 12/30/17 2130  TROPIPOC 0.22*    BNP Recent Labs  Lab 12/31/17 0008  BNP 257.0*    DDimer No results for input(s):  DDIMER in the last 168 hours.  Radiology/Studies:  Dg Chest 2 View  Result Date: 12/30/2017 CLINICAL DATA:  Found down, altered EXAM: CHEST - 2 VIEW COMPARISON:  08/04/2008 FINDINGS: Trace pleural effusions. Cardiomegaly with mild diffuse interstitial opacity suggesting minimal edema. Minimal atelectasis left base. No pneumothorax. IMPRESSION: 1. Trace  pleural effusions. 2. Cardiomegaly with mild diffuse interstitial opacity suggesting mild interstitial edema Electronically Signed   By: Donavan Foil M.D.   On: 12/30/2017 22:01   Ct Head Wo Contrast  Result Date: 12/30/2017 CLINICAL DATA:  Unwitnessed fall. Altered mental status with bruising to the arms, elbows, and face. EXAM: CT HEAD WITHOUT CONTRAST CT CERVICAL SPINE WITHOUT CONTRAST TECHNIQUE: Multidetector CT imaging of the head and cervical spine was performed following the standard protocol without intravenous contrast. Multiplanar CT image reconstructions of the cervical spine were also generated. COMPARISON:  None. FINDINGS: CT HEAD FINDINGS Brain: Mild diffuse cerebral atrophy. Mild ventricular dilatation consistent with central atrophy. Low-attenuation changes in the deep white matter consistent with small vessel ischemia. No mass effect or midline shift. No abnormal extra-axial fluid collections. Gray-white matter junctions are distinct. Basal cisterns are not effaced. No acute intracranial hemorrhage. Vascular: Mild intracranial arterial vascular calcifications. Skull: Calvarium appears intact. No acute depressed skull fractures. Sinuses/Orbits: Mucosal thickening in the paranasal sinuses. No acute air-fluid levels. Mastoid air cells are clear. Other: None. CT CERVICAL SPINE FINDINGS Alignment: Normal alignment of the cervical vertebrae and facet joints. C1-2 articulation appears intact. Skull base and vertebrae: Skull base appears intact. No vertebral compression deformities. No focal bone lesion or bone destruction. Bone cortex appears intact. Soft tissues and spinal canal: No prevertebral soft tissue swelling. No abnormal soft tissue paraspinal mass or infiltration. Disc levels: Degenerative changes in the cervical spine with narrowed disc spaces and associated endplate hypertrophic changes. Degenerative changes in the facet joints and at C1-2. Upper chest: Motion artifact limits examination.  Possible patchy airspace infiltrates in the right apex. Edema versus pneumonia. Other: None. IMPRESSION: 1. No acute intracranial abnormalities. Chronic atrophy and small vessel ischemic changes. 2. Normal alignment of the cervical spine. Degenerative changes. No acute displaced fractures identified. Electronically Signed   By: Lucienne Capers M.D.   On: 12/30/2017 21:56   Ct Cervical Spine Wo Contrast  Result Date: 12/30/2017 CLINICAL DATA:  Unwitnessed fall. Altered mental status with bruising to the arms, elbows, and face. EXAM: CT HEAD WITHOUT CONTRAST CT CERVICAL SPINE WITHOUT CONTRAST TECHNIQUE: Multidetector CT imaging of the head and cervical spine was performed following the standard protocol without intravenous contrast. Multiplanar CT image reconstructions of the cervical spine were also generated. COMPARISON:  None. FINDINGS: CT HEAD FINDINGS Brain: Mild diffuse cerebral atrophy. Mild ventricular dilatation consistent with central atrophy. Low-attenuation changes in the deep white matter consistent with small vessel ischemia. No mass effect or midline shift. No abnormal extra-axial fluid collections. Gray-white matter junctions are distinct. Basal cisterns are not effaced. No acute intracranial hemorrhage. Vascular: Mild intracranial arterial vascular calcifications. Skull: Calvarium appears intact. No acute depressed skull fractures. Sinuses/Orbits: Mucosal thickening in the paranasal sinuses. No acute air-fluid levels. Mastoid air cells are clear. Other: None. CT CERVICAL SPINE FINDINGS Alignment: Normal alignment of the cervical vertebrae and facet joints. C1-2 articulation appears intact. Skull base and vertebrae: Skull base appears intact. No vertebral compression deformities. No focal bone lesion or bone destruction. Bone cortex appears intact. Soft tissues and spinal canal: No prevertebral soft tissue swelling. No abnormal soft tissue paraspinal mass or infiltration. Disc levels:  Degenerative changes in the cervical spine with narrowed disc spaces and associated endplate hypertrophic changes. Degenerative changes in the facet joints and at C1-2. Upper chest: Motion artifact limits examination. Possible patchy airspace infiltrates in the right apex. Edema versus pneumonia. Other: None. IMPRESSION: 1. No acute intracranial abnormalities. Chronic atrophy and small vessel ischemic changes. 2. Normal alignment of the cervical spine. Degenerative changes. No acute displaced fractures identified. Electronically Signed   By: Lucienne Capers M.D.   On: 12/30/2017 21:56    Assessment and Plan:   1. Elevated Troponins but not in proportion of CK after being on floor/ground for 2 days.  Normal EF.  May be some demand ischemia with rhabdomyolosis Dr. Gwenlyn Found to see.she is on ASA new this admit and Crestor which she has been on.  No further work up . 2. Acute metabolic encephalopathy per IM pt is NPO 3. Rhabdomyolysis per IM 4. Leukocytosis per IM 5. Hx RA  6. CKD-3  Cr 1.30, 1.10 and today 1.26 7. Fall unknown cause  See # 1      For questions or updates, please contact [facility] Please consult [email or web address] for contact info under     Signed, Cecilie Kicks, NP  12/31/2017 2:14 PM   Agree with note written by Cecilie Kicks RNP  We are asked to see this 83 year old female who is independent lives at home after she was found down for 2 days and had rhabdomyolysis  with CPKs in the 3000 range and a troponin of 0.2.  He has no prior cardiac history.  Her EKG showed no acute changes.  It is unclear why she fell down the first place.  She does have a carotid bruit but this was thought to be related to tortuosity by recent duplex several years ago.  She has had a neurologic work-up as well with head CT.  I do not think that her troponin represents ACS or problems related to her rhabdo.  Her exam is benign.  She did however developed A. fib with RVR after I saw her which could  potentially have caused syncope and her to fall.  This will need to be further worked up.  Lorretta Harp, M.D., [place], [facility], [place] [address] [place], [place]  [address]  [phone] 12/31/2017 5:57 PM  Quay Burow 12/31/2017 5:55 PM

## 2017-12-31 NOTE — Progress Notes (Signed)
  Echocardiogram 2D Echocardiogram has been performed.  Darlina Sicilian M 12/31/2017, 9:53 AM

## 2017-12-31 NOTE — Progress Notes (Addendum)
PROGRESS NOTE    Danielle Hebert  EXH:[id number] DOB: Mar 24, 1934 DOA: 12/30/2017 PCP: Maurice Small, MD   Brief Narrative:  HPI On 12/30/2017 by Dr. Ivor Costa Danielle Hebert is a 83 y.o. female with medical history significant of hyperlipidemia, rheumatoid arthritis on Enbrel injection, CKD 3, who presents with altered mental status and fall.  Per her daughter, pt was last seen in normal health 2 days ago. Relative tried to call her yesterday and did not get a hold of her. They tried several times again today and became worried, so went to check on her. Pt was found on the ground at her house, confused and covered in feces.  She has bruises in her face and forehead.  Per daughter, patient normally is alert and oriented x3.  Currently patient is oriented to place, but not to the time and place.  Patient moves all extremities normally.  No facial droop, slurred speech.  She denies any pain anywhere. No acute respiratory distress, shortness breath, nausea, vomiting, diarrhea noted.  Interim history Patient admitted for acute encephalopathy along with fall.  Also found to have an elevated troponin of 0.22.  Work-up underway. Assessment & Plan   Acute metabolic encephalopathy with fall -Unknown etiology -CT head and C-spine negative for acute issues -Able to move all extremities without any focal deficits -Patient noted to have leukocytosis on admission, WBC of 20 however no fever. -Chest x-ray reviewed and unremarkable for infection, however trace pleural effusion -UA + trace leukocytes, however unremarkable for infection -hemoglobin A1c 5.8 -Blood cultures pending  -PT and OT consulted  Elevated troponin -Denies chest pain -Troponin on admission 0.21, trending downward to 0.13 -Suspect secondary to acute rhabdomyolysis, CK level was 3293 on admission -Echocardiogram pending -BNP 257 on admission -Cardiology consulted and appreciated -Chest x-ray does show trace pleural  effusion  Acute rhabdomyolysis -As above, CK level 3293 on admission -Continue gentle IV fluid hydration -CK level this morning 2999  Elevated LFTs -Patient has been on methotrexate for rheumatoid arthritis -Hepatitis panel pending -will hold statin   Leukocytosis -possibly reactive -UA and CXR unremarkable for infection -continue to monitor CBC  Chronic kidney disease, stage III -Creatinine appears to be at baseline and stable, continue to monitor BMP  Rheumatoid arthritis -Patient with notable ulnar deviation on exam -Patient takes weekly methotrexate injections as well as Enbrel injections  DVT Prophylaxis Lovenox  Code Status: Full  Family Communication: None at bedside. Daughter via phone.   Disposition Plan: Admitted.  Patient currently requiring IV fluids given CK level.  Patient continues to have altered mental status of unknown etiology.  Suspect patient will require an additional 24 to 48 hours inpatient hospitalization.  Disposition to be determined as patient lives at home alone.  Consultants Cardiology  Procedures  Echocardiogram  Antibiotics   Anti-infectives (From admission, onward)   None      Subjective:   Danielle Hebert seen and examined today.  Patient has no complaints today.  Wishes to go home.  Currently denies chest pain, shortness breath, abdominal pain, nausea vomiting, diarrhea constipation, dizziness or headache.  She does not know why she is in the hospital.  She does not recall falling.  Objective:   Vitals:   12/31/17 0119 12/31/17 0317 12/31/17 0821 12/31/17 1207  BP: 125/67 104/68 (!) 110/54 122/62  Pulse: 93 86 95 93  Resp: 20 20 17 18   Temp: 99.3 F (37.4 C) 98.5 F (36.9 C) 99.1 F (37.3 C) 98.1 F (36.7 C)  TempSrc:  Oral Oral Oral Oral  SpO2: 96% 98% 94% 94%  Weight: 70 kg     Height: 5\' 6"  (1.676 m)       Intake/Output Summary (Last 24 hours) at 12/31/2017 1405 Last data filed at 12/31/2017 0014 Gross per 24  hour  Intake 1250 ml  Output -  Net 1250 ml   Filed Weights   12/31/17 0119  Weight: 70 kg    Exam  General: Well developed, elderly, well-nourished, NAD  HEENT: [place]. Mucous membranes moist.  Left periorbital/frontal/maxillary ecchymosis,   Neck: Supple  Cardiovascular: S1 S2 auscultated, no rubs, murmurs or gallops. Regular rate and rhythm.  Respiratory: Clear to auscultation bilaterally with equal chest rise  Abdomen: Soft, nontender, nondistended, + bowel sounds  Extremities: warm dry without cyanosis clubbing or edema.  Ulnar deviation bilaterally.  Neuro: AAOx3 (place, self, time, not situation), nonfocal   Skin: Ecchymosis on various areas  Psych: Normal affect and demeanor, pleasant   Data Reviewed: I have personally reviewed following labs and imaging studies  CBC: Recent Labs  Lab 12/30/17 2124 12/30/17 2133 12/31/17 0012  WBC 20.3*  --  19.9*  NEUTROABS 19.1*  --   --   HGB 13.4 15.0 12.4  HCT 41.0 44.0 37.8  MCV 93.4  --  94.3  PLT 284  --  203   Basic Metabolic Panel: Recent Labs  Lab 12/30/17 2124 12/30/17 2133 12/31/17 0012  NA 132* 131* 132*  K 3.9 4.2 4.3  CL 92* 94* 95*  CO2 26  --  22  GLUCOSE 142* 138* 130*  BUN 27* 36* 27*  CREATININE 1.30* 1.10* 1.26*  CALCIUM 9.9  --  9.5   GFR: Estimated Creatinine Clearance: 32.8 mL/min (A) (by C-G formula based on SCr of 1.26 mg/dL (H)). Liver Function Tests: Recent Labs  Lab 12/30/17 2124  AST 114*  ALT 36  ALKPHOS 60  BILITOT 1.1  PROT 7.2  ALBUMIN 3.8   No results for input(s): LIPASE, AMYLASE in the last 168 hours. Recent Labs  Lab 12/31/17 0009  AMMONIA 14   Coagulation Profile: Recent Labs  Lab 12/30/17 2124  INR 1.14   Cardiac Enzymes: Recent Labs  Lab 12/30/17 2124 12/31/17 0008 12/31/17 0012 12/31/17 1153  CKTOTAL 3,293*  --  2,999*  --   TROPONINI  --  0.21*  --  0.13*   BNP (last 3 results) No results for input(s): PROBNP in the last 8760  hours. HbA1C: Recent Labs    12/31/17 0010  HGBA1C 5.8*   CBG: No results for input(s): GLUCAP in the last 168 hours. Lipid Profile: Recent Labs    12/31/17 0010  CHOL 186  HDL 69  LDLCALC 103*  TRIG 68  CHOLHDL 2.7   Thyroid Function Tests: No results for input(s): TSH, T4TOTAL, FREET4, T3FREE, THYROIDAB in the last 72 hours. Anemia Panel: No results for input(s): VITAMINB12, FOLATE, FERRITIN, TIBC, IRON, RETICCTPCT in the last 72 hours. Urine analysis:    Component Value Date/Time   COLORURINE YELLOW 12/31/2017 0437   APPEARANCEUR HAZY (A) 12/31/2017 0437   LABSPEC 1.018 12/31/2017 0437   PHURINE 5.0 12/31/2017 0437   GLUCOSEU NEGATIVE 12/31/2017 0437   HGBUR SMALL (A) 12/31/2017 0437   BILIRUBINUR NEGATIVE 12/31/2017 0437   KETONESUR 5 (A) 12/31/2017 0437   PROTEINUR NEGATIVE 12/31/2017 0437   UROBILINOGEN 0.2 08/04/2008 1120   NITRITE NEGATIVE 12/31/2017 0437   LEUKOCYTESUR TRACE (A) 12/31/2017 0437   Sepsis Labs: @LABRCNTIP (procalcitonin:4,lacticidven:4)  )No results found for  this or any previous visit (from the past 240 hour(s)).    Radiology Studies: Dg Chest 2 View  Result Date: 12/30/2017 CLINICAL DATA:  Found down, altered EXAM: CHEST - 2 VIEW COMPARISON:  08/04/2008 FINDINGS: Trace pleural effusions. Cardiomegaly with mild diffuse interstitial opacity suggesting minimal edema. Minimal atelectasis left base. No pneumothorax. IMPRESSION: 1. Trace pleural effusions. 2. Cardiomegaly with mild diffuse interstitial opacity suggesting mild interstitial edema Electronically Signed   By: Donavan Foil M.D.   On: 12/30/2017 22:01   Ct Head Wo Contrast  Result Date: 12/30/2017 CLINICAL DATA:  Unwitnessed fall. Altered mental status with bruising to the arms, elbows, and face. EXAM: CT HEAD WITHOUT CONTRAST CT CERVICAL SPINE WITHOUT CONTRAST TECHNIQUE: Multidetector CT imaging of the head and cervical spine was performed following the standard protocol without  intravenous contrast. Multiplanar CT image reconstructions of the cervical spine were also generated. COMPARISON:  None. FINDINGS: CT HEAD FINDINGS Brain: Mild diffuse cerebral atrophy. Mild ventricular dilatation consistent with central atrophy. Low-attenuation changes in the deep white matter consistent with small vessel ischemia. No mass effect or midline shift. No abnormal extra-axial fluid collections. Gray-white matter junctions are distinct. Basal cisterns are not effaced. No acute intracranial hemorrhage. Vascular: Mild intracranial arterial vascular calcifications. Skull: Calvarium appears intact. No acute depressed skull fractures. Sinuses/Orbits: Mucosal thickening in the paranasal sinuses. No acute air-fluid levels. Mastoid air cells are clear. Other: None. CT CERVICAL SPINE FINDINGS Alignment: Normal alignment of the cervical vertebrae and facet joints. C1-2 articulation appears intact. Skull base and vertebrae: Skull base appears intact. No vertebral compression deformities. No focal bone lesion or bone destruction. Bone cortex appears intact. Soft tissues and spinal canal: No prevertebral soft tissue swelling. No abnormal soft tissue paraspinal mass or infiltration. Disc levels: Degenerative changes in the cervical spine with narrowed disc spaces and associated endplate hypertrophic changes. Degenerative changes in the facet joints and at C1-2. Upper chest: Motion artifact limits examination. Possible patchy airspace infiltrates in the right apex. Edema versus pneumonia. Other: None. IMPRESSION: 1. No acute intracranial abnormalities. Chronic atrophy and small vessel ischemic changes. 2. Normal alignment of the cervical spine. Degenerative changes. No acute displaced fractures identified. Electronically Signed   By: Lucienne Capers M.D.   On: 12/30/2017 21:56   Ct Cervical Spine Wo Contrast  Result Date: 12/30/2017 CLINICAL DATA:  Unwitnessed fall. Altered mental status with bruising to the  arms, elbows, and face. EXAM: CT HEAD WITHOUT CONTRAST CT CERVICAL SPINE WITHOUT CONTRAST TECHNIQUE: Multidetector CT imaging of the head and cervical spine was performed following the standard protocol without intravenous contrast. Multiplanar CT image reconstructions of the cervical spine were also generated. COMPARISON:  None. FINDINGS: CT HEAD FINDINGS Brain: Mild diffuse cerebral atrophy. Mild ventricular dilatation consistent with central atrophy. Low-attenuation changes in the deep white matter consistent with small vessel ischemia. No mass effect or midline shift. No abnormal extra-axial fluid collections. Gray-white matter junctions are distinct. Basal cisterns are not effaced. No acute intracranial hemorrhage. Vascular: Mild intracranial arterial vascular calcifications. Skull: Calvarium appears intact. No acute depressed skull fractures. Sinuses/Orbits: Mucosal thickening in the paranasal sinuses. No acute air-fluid levels. Mastoid air cells are clear. Other: None. CT CERVICAL SPINE FINDINGS Alignment: Normal alignment of the cervical vertebrae and facet joints. C1-2 articulation appears intact. Skull base and vertebrae: Skull base appears intact. No vertebral compression deformities. No focal bone lesion or bone destruction. Bone cortex appears intact. Soft tissues and spinal canal: No prevertebral soft tissue swelling. No abnormal soft tissue paraspinal  mass or infiltration. Disc levels: Degenerative changes in the cervical spine with narrowed disc spaces and associated endplate hypertrophic changes. Degenerative changes in the facet joints and at C1-2. Upper chest: Motion artifact limits examination. Possible patchy airspace infiltrates in the right apex. Edema versus pneumonia. Other: None. IMPRESSION: 1. No acute intracranial abnormalities. Chronic atrophy and small vessel ischemic changes. 2. Normal alignment of the cervical spine. Degenerative changes. No acute displaced fractures identified.  Electronically Signed   By: Lucienne Capers M.D.   On: 12/30/2017 21:56     Scheduled Meds: . aspirin EC  325 mg Oral Daily  . cholecalciferol  1,000 Units Oral Daily  . enoxaparin (LOVENOX) injection  40 mg Subcutaneous Daily  . omega-3 acid ethyl esters  1 g Oral Daily   Continuous Infusions: . sodium chloride 50 mL/hr at 12/31/17 0354     LOS: 1 day   Time Spent in minutes   45 minutes  Danielle Hebert D.O. on 12/31/2017 at 2:05 PM  Between 7am to 7pm - Please see pager noted on [email or web address]  After 7pm go to [email or web address]  And look for the night coverage person covering for me after hours  [facility] Office  [phone]

## 2017-12-31 NOTE — Progress Notes (Signed)
16:10 Notified Cardiology of patients new onset a.fib with RVR and low blood pressures. Orders to bolus with NS and start cardizem.  18:00 Notified Dr. Alvester Chou of patients low blood pressures and HR still in the 130's. Orders to titrate cardizem to 10mg  if SBP stays above 90.

## 2017-12-31 NOTE — Progress Notes (Signed)
I was called by RN pt went into a fib just after I saw her.  HR is elevated to 150- discussed with Dr. Adora Fridge and will add iv dilt and IV heparin.  Will ask Dr. Ree Kida to change to stepdown bed but can stay on this floor. I ordered lytes, TSH and mag+ level.

## 2018-01-01 DIAGNOSIS — I480 Paroxysmal atrial fibrillation: Secondary | ICD-10-CM

## 2018-01-01 LAB — HEPARIN LEVEL (UNFRACTIONATED)
HEPARIN UNFRACTIONATED: 0.39 [IU]/mL (ref 0.30–0.70)
HEPARIN UNFRACTIONATED: 0.44 [IU]/mL (ref 0.30–0.70)

## 2018-01-01 LAB — HEPATITIS PANEL, ACUTE
HEP A IGM: NEGATIVE
Hep B C IgM: NEGATIVE
Hepatitis B Surface Ag: NEGATIVE

## 2018-01-01 LAB — COMPREHENSIVE METABOLIC PANEL
ALT: 27 U/L (ref 0–44)
ANION GAP: 9 (ref 5–15)
AST: 55 U/L — AB (ref 15–41)
Albumin: 2.7 g/dL — ABNORMAL LOW (ref 3.5–5.0)
Alkaline Phosphatase: 55 U/L (ref 38–126)
BUN: 31 mg/dL — AB (ref 8–23)
CHLORIDE: 101 mmol/L (ref 98–111)
CO2: 23 mmol/L (ref 22–32)
Calcium: 8.6 mg/dL — ABNORMAL LOW (ref 8.9–10.3)
Creatinine, Ser: 1.48 mg/dL — ABNORMAL HIGH (ref 0.44–1.00)
GFR, EST AFRICAN AMERICAN: 37 mL/min — AB (ref >60)
GFR, EST NON AFRICAN AMERICAN: 32 mL/min — AB (ref >60)
Glucose, Bld: 161 mg/dL — ABNORMAL HIGH (ref 70–99)
POTASSIUM: 4.4 mmol/L (ref 3.5–5.1)
Sodium: 133 mmol/L — ABNORMAL LOW (ref 135–145)
Total Bilirubin: 0.9 mg/dL (ref 0.3–1.2)
Total Protein: 5.7 g/dL — ABNORMAL LOW (ref 6.5–8.1)

## 2018-01-01 LAB — CBC
HCT: 30.6 % — ABNORMAL LOW (ref 36.0–46.0)
HEMOGLOBIN: 10.3 g/dL — AB (ref 12.0–15.0)
MCH: 31.6 pg (ref 26.0–34.0)
MCHC: 33.7 g/dL (ref 30.0–36.0)
MCV: 93.9 fL (ref 80.0–100.0)
NRBC: 0 % (ref 0.0–0.2)
Platelets: 198 10*3/uL (ref 150–400)
RBC: 3.26 MIL/uL — AB (ref 3.87–5.11)
RDW: 14.2 % (ref 11.5–15.5)
WBC: 12.3 10*3/uL — AB (ref 4.0–10.5)

## 2018-01-01 LAB — CK: CK TOTAL: 592 U/L — AB (ref 38–234)

## 2018-01-01 MED ORDER — MAGNESIUM SULFATE 2 GM/50ML IV SOLN
2.0000 g | Freq: Once | INTRAVENOUS | Status: AC
  Administered 2018-01-01: 2 g via INTRAVENOUS
  Filled 2018-01-01: qty 50

## 2018-01-01 NOTE — Progress Notes (Signed)
Cardizem drip stopped with patient's BP at 87/61 with a MAP of 70. HR better controlled in the 80's. Will continue to monitor closely.

## 2018-01-01 NOTE — Progress Notes (Signed)
OT Cancellation Note  Patient Details Name: Danielle Hebert MRN: [id number] DOB: Nov 08, 1934   Cancelled Treatment:     Reason OT Evaluation/Treatment not complete/patient not seen: Orders received and chart reviewed, noted critical lab values/Troponin. Will hold evaluation at this time and assess for OT when medically able.  Carlynn Herald Jules Baty Beth Dixon, OTR/L  01/01/2018, 8:11 AM

## 2018-01-01 NOTE — Consult Note (Signed)
[facility] CM Primary Care Navigator  01/01/2018  Danielle Hebert 07/10/1934 [id number]   Met with patientand son ([name]) at the bedside to identify possible discharge needs. Patient reportshaving a "fall at home and passed out". Per MD note, she presented with altered mental status and fall which had led to this admission. (acute metabolic encephalopathy with fall, acute rhabdomyolysis  Patient endorses Dr.Carol Webb with [facility] [place] as herprimary care provider.   [facility] [address] [facility] without any problem.  Patientverbalized managing herownmedications at home using "pill box" system filled once a week.   Patientreports that she has beendrivingprior to admission but son ([name]) and daughter (Marcy) will be able to provide transportation to her doctors' appointments after discharge.  Patientlivesalone at home and children are providing assistance to her when needed.  Disposition for discharge isstillto be determined pending therapy evaluation and recommendation. Plans to go home, but if needed for her to go to a rehab facility, then she will go prior to returning home.   Patient and son voiced understanding to call primary care provider's office whenpatient returns home, for a post discharge follow-up appointment within 1- 2 weeks or sooner if needs arise.Patient letter (with PCP's contact number) was provided as a reminder.  Explained to patient and son about [facility] CM services available for health management and resources at home but both indicated having no current needs or concerns at this point. Patientand son voiced understandingto seekreferral to [facility] care managementfrom primary care providerif deemed necessary andappropriate foranyservices in thenear future.  [facility] care management information provided for future needs that patient may have.   Primary  care provider's office is listed as providing transition of care (TOC) follow-up.   For additional questions please contact:   A. , BSN, RN-BC [facility] PRIMARY CARE Navigator Cell: [phone] 

## 2018-01-01 NOTE — Progress Notes (Signed)
[place] for Heparin Indication: atrial fibrillation  Allergies  Allergen Reactions  . Ciprofloxacin   . Remicade [Infliximab]     Patient Measurements: Height: 5\' 6"  (167.6 cm) Weight: 154 lb 5.2 oz (70 kg) IBW/kg (Calculated) : 59.3 Heparin Dosing Weight: 70 kg  Vital Signs: Temp: 98.6 F (37 C) (10/17 0836) Temp Source: Oral (10/17 0836) BP: 115/57 (10/17 0836) Pulse Rate: 84 (10/17 0836)  Labs: Recent Labs    12/30/17 2124 12/30/17 2133 12/31/17 0008 12/31/17 0012 12/31/17 1153 12/31/17 1631 01/01/18 0354 01/01/18 0959  HGB 13.4 15.0  --  12.4  --   --  10.3*  --   HCT 41.0 44.0  --  37.8  --   --  30.6*  --   PLT 284  --   --  263  --   --  198  --   LABPROT 14.5  --   --   --   --   --   --   --   INR 1.14  --   --   --   --   --   --   --   HEPARINUNFRC  --   --   --   --   --   --  0.39 0.44  CREATININE 1.30* 1.10*  --  1.26*  --  1.72* 1.48*  --   CKTOTAL 3,293*  --   --  2,999*  --   --  592*  --   TROPONINI  --   --  0.21*  --  0.13*  --   --   --     Estimated Creatinine Clearance: 27.9 mL/min (A) (by C-G formula based on SCr of 1.48 mg/dL (H)).  Assessment:  83 y.o. female with new onset AFib, cardiology evaluating for elevated troponin after admission for being found down at home for 2 days with altered mental status. Patient was on prophylactic dose Lovenox 40 mg, last dose today at noon. No anticoagulation PTA.   Heparin level therapeutic at 0.44, CBC drop, possibly dilution, no bleeding noted at this time.  Goal of Therapy:  Heparin level 0.3-0.7 units/ml Monitor platelets by anticoagulation protocol: Yes   Plan:  Continue heparin gtt at 1050 units/hr Daily heparin level, CBC, s/s bleeding F/u start of oral AC  Bertis Ruddy, PharmD Clinical Pharmacist Please check AMION for all [place] numbers 01/01/2018 11:15 AM

## 2018-01-01 NOTE — Progress Notes (Signed)
PROGRESS NOTE    Anneth Brunell  IWP:[id number] DOB: [birth year] DOA: 12/30/2017 PCP: Maurice Small, MD   Brief Narrative:  HPI On 12/30/2017 by Dr. Ivor Costa Shakeerah Gradel is a 83 y.o. female with medical history significant of hyperlipidemia, rheumatoid arthritis on Enbrel injection, CKD 3, who presents with altered mental status and fall.  Per her daughter, pt was last seen in normal health 2 days ago. Relative tried to call her yesterday and did not get a hold of her. They tried several times again today and became worried, so went to check on her. Pt was found on the ground at her house, confused and covered in feces.  She has bruises in her face and forehead.  Per daughter, patient normally is alert and oriented x3.  Currently patient is oriented to place, but not to the time and place.  Patient moves all extremities normally.  No facial droop, slurred speech.  She denies any pain anywhere. No acute respiratory distress, shortness breath, nausea, vomiting, diarrhea noted.  Interim history Patient admitted for acute encephalopathy along with fall.  Also found to have an elevated troponin of 0.22.  Work-up underway. Assessment & Plan   Acute metabolic encephalopathy with fall -Unknown etiology, possible medication vs Afib -CT head and C-spine negative for acute issues -Able to move all extremities without any focal deficits -Patient noted to have leukocytosis on admission, WBC of 20 however no fever. -Chest x-ray reviewed and unremarkable for infection, however trace pleural effusion -UA + trace leukocytes, however unremarkable for infection -hemoglobin A1c 5.8 -Blood cultures show no growth to date -PT and OT consulted, pending evaluation  Atrial fibrillation -new onset -?could patient have had an episode of AF with hypotension which led to her fall -Cardiology consulted and appreciated -was placed on cardizem drip -Currently patient is back in sinus rhythm and rate  controlled -no complaints of chest pain or palpitations -would patient benefit from holter or loop? -CHADSVASC 3 based on age and gender -currently on heparin- will likely need oral AC -TSH 1.563 (WNL), K was replaced and up to 4.4 now, magnesium 1.8 (will give dosage for goal of 2)  Elevated troponin -Denies chest pain -Troponin on admission 0.21, trending downward to 0.13 -Suspect secondary to acute rhabdomyolysis, CK level was 3293 on admission -Echocardiogram EF 50-53%, grade 2 diastolic dysfunction. No RWMA -BNP 257 on admission -Cardiology consulted and appreciated -Chest x-ray does show trace pleural effusion  Acute rhabdomyolysis -As above, CK level 3293 on admission -Continue gentle IV fluid hydration -CK level this morning down to 592  Elevated LFTs -Patient has been on methotrexate for rheumatoid arthritis -Hepatitis panel pending -Statin held  Leukocytosis -possibly reactive -UA and CXR unremarkable for infection -WBC down from 20.3 to 12.3 without use of antibiotics -continue to monitor CBC  Chronic kidney disease, stage III -Creatinine appears to be at baseline and stable, continue to monitor BMP  Rheumatoid arthritis -Patient with notable ulnar deviation on exam -Patient takes weekly methotrexate injections as well as Enbrel injections  Hypokalemia -replaced, currently 4.4 -continue to monitor BMP  DVT Prophylaxis Lovenox  Code Status: Full  Family Communication: None at bedside.   Disposition Plan: Admitted.  Mental status improving, however patient does not remember falling. She developed new onset AF. Cardiology consulted and pending further recommendations. Pending PT/OT evals. Suspect patient will require additional 1-2 days of hospital admission as she is still medically unstable and workup is not complete.  Disposition to be determined as patient lives at  home alone.  Consultants Cardiology  Procedures  Echocardiogram  Antibiotics     Anti-infectives (From admission, onward)   None      Subjective:   [name] seen and examined today.  Patient feels sore today. She denies chest pain, shortness of breath, abdominal pain, N/V/D/C, dizziness, headache. Does not remember what happened to her on Sunday or that she fell.    Objective:   Vitals:   01/01/18 0040 01/01/18 0300 01/01/18 0350 01/01/18 0836  BP: (!) 90/59 (!) 106/59 (!) 96/52 (!) 115/57  Pulse:  78 72 84  Resp:   18 17  Temp:   98.1 F (36.7 C) 98.6 F (37 C)  TempSrc:   Oral Oral  SpO2:   97% 96%  Weight:      Height:        Intake/Output Summary (Last 24 hours) at 01/01/2018 1013 Last data filed at 12/31/2017 2000 Gross per 24 hour  Intake 1447.45 ml  Output -  Net 1447.45 ml   Filed Weights   12/31/17 0119  Weight: 70 kg   Exam  General: Well developed, elderly, well nourished, NAD  HEENT: [place], left sided facial bruising, mucous membranes moist.   Neck: Supple, right carotid bruit  Cardiovascular: S1 S2 auscultated, RRR, no murmur  Respiratory: Clear to auscultation bilaterally with equal chest rise  Abdomen: Soft, nontender, nondistended, + bowel sounds  Extremities: warm dry without cyanosis clubbing or edema  Neuro: AAOx3, nonfocal  Psych: Normal affect and demeanor with intact judgement and insight, pleasant   Data Reviewed: I have personally reviewed following labs and imaging studies  CBC: Recent Labs  Lab 12/30/17 2124 12/30/17 2133 12/31/17 0012 01/01/18 0354  WBC 20.3*  --  19.9* 12.3*  NEUTROABS 19.1*  --   --   --   HGB 13.4 15.0 12.4 10.3*  HCT 41.0 44.0 37.8 30.6*  MCV 93.4  --  94.3 93.9  PLT 284  --  263 619   Basic Metabolic Panel: Recent Labs  Lab 12/30/17 2124 12/30/17 2133 12/31/17 0012 12/31/17 1631 01/01/18 0354  NA 132* 131* 132* 136 133*  K 3.9 4.2 4.3 2.9* 4.4  CL 92* 94* 95* 97* 101  CO2 26  --  22 30 23   GLUCOSE 142* 138* 130* 212* 161*  BUN 27* 36* 27* 25* 31*   CREATININE 1.30* 1.10* 1.26* 1.72* 1.48*  CALCIUM 9.9  --  9.5 8.9 8.6*  MG  --   --   --  1.8  --    GFR: Estimated Creatinine Clearance: 27.9 mL/min (A) (by C-G formula based on SCr of 1.48 mg/dL (H)). Liver Function Tests: Recent Labs  Lab 12/30/17 2124 01/01/18 0354  AST 114* 55*  ALT 36 27  ALKPHOS 60 55  BILITOT 1.1 0.9  PROT 7.2 5.7*  ALBUMIN 3.8 2.7*   No results for input(s): LIPASE, AMYLASE in the last 168 hours. Recent Labs  Lab 12/31/17 0009  AMMONIA 14   Coagulation Profile: Recent Labs  Lab 12/30/17 2124  INR 1.14   Cardiac Enzymes: Recent Labs  Lab 12/30/17 2124 12/31/17 0008 12/31/17 0012 12/31/17 1153 01/01/18 0354  CKTOTAL 3,293*  --  2,999*  --  592*  TROPONINI  --  0.21*  --  0.13*  --    BNP (last 3 results) No results for input(s): PROBNP in the last 8760 hours. HbA1C: Recent Labs    12/31/17 0010  HGBA1C 5.8*   CBG: No results for input(s): GLUCAP  in the last 168 hours. Lipid Profile: Recent Labs    12/31/17 0010  CHOL 186  HDL 69  LDLCALC 103*  TRIG 68  CHOLHDL 2.7   Thyroid Function Tests: Recent Labs    12/31/17 1631  TSH 1.563   Anemia Panel: No results for input(s): VITAMINB12, FOLATE, FERRITIN, TIBC, IRON, RETICCTPCT in the last 72 hours. Urine analysis:    Component Value Date/Time   COLORURINE YELLOW 12/31/2017 0437   APPEARANCEUR HAZY (A) 12/31/2017 0437   LABSPEC 1.018 12/31/2017 0437   PHURINE 5.0 12/31/2017 0437   GLUCOSEU NEGATIVE 12/31/2017 0437   HGBUR SMALL (A) 12/31/2017 0437   BILIRUBINUR NEGATIVE 12/31/2017 0437   KETONESUR 5 (A) 12/31/2017 0437   PROTEINUR NEGATIVE 12/31/2017 0437   UROBILINOGEN 0.2 08/04/2008 1120   NITRITE NEGATIVE 12/31/2017 0437   LEUKOCYTESUR TRACE (A) 12/31/2017 0437   Sepsis Labs: @LABRCNTIP (procalcitonin:4,lacticidven:4)  ) Recent Results (from the past 240 hour(s))  Culture, blood (Routine X 2) w Reflex to ID Panel     Status: None (Preliminary result)    Collection Time: 12/31/17 12:09 AM  Result Value Ref Range Status   Specimen Description BLOOD RIGHT ANTECUBITAL  Final   Special Requests   Final    BOTTLES DRAWN AEROBIC AND ANAEROBIC Blood Culture results may not be optimal due to an inadequate volume of blood received in culture bottles   Culture   Final    NO GROWTH 1 DAY Performed at [facility], [place] [address] [place], [place] [address]    Report Status PENDING  Incomplete  Culture, blood (Routine X 2) w Reflex to ID Panel     Status: None (Preliminary result)   Collection Time: 12/31/17 12:17 AM  Result Value Ref Range Status   Specimen Description BLOOD LEFT HAND  Final   Special Requests   Final    BOTTLES DRAWN AEROBIC AND ANAEROBIC Blood Culture results may not be optimal due to an inadequate volume of blood received in culture bottles   Culture   Final    NO GROWTH 1 DAY Performed at [facility], [place] [address] [place], [place] [address]    Report Status PENDING  Incomplete      Radiology Studies: Dg Chest 2 View  Result Date: 12/30/2017 CLINICAL DATA:  Found down, altered EXAM: CHEST - 2 VIEW COMPARISON:  08/04/2008 FINDINGS: Trace pleural effusions. Cardiomegaly with mild diffuse interstitial opacity suggesting minimal edema. Minimal atelectasis left base. No pneumothorax. IMPRESSION: 1. Trace pleural effusions. 2. Cardiomegaly with mild diffuse interstitial opacity suggesting mild interstitial edema Electronically Signed   By: Donavan Foil M.D.   On: 12/30/2017 22:01   Ct Head Wo Contrast  Result Date: 12/30/2017 CLINICAL DATA:  Unwitnessed fall. Altered mental status with bruising to the arms, elbows, and face. EXAM: CT HEAD WITHOUT CONTRAST CT CERVICAL SPINE WITHOUT CONTRAST TECHNIQUE: Multidetector CT imaging of the head and cervical spine was performed following the standard protocol without intravenous contrast. Multiplanar CT image reconstructions of the cervical spine were also generated.  COMPARISON:  None. FINDINGS: CT HEAD FINDINGS Brain: Mild diffuse cerebral atrophy. Mild ventricular dilatation consistent with central atrophy. Low-attenuation changes in the deep white matter consistent with small vessel ischemia. No mass effect or midline shift. No abnormal extra-axial fluid collections. Gray-white matter junctions are distinct. Basal cisterns are not effaced. No acute intracranial hemorrhage. Vascular: Mild intracranial arterial vascular calcifications. Skull: Calvarium appears intact. No acute depressed skull fractures. Sinuses/Orbits: Mucosal thickening in the paranasal  sinuses. No acute air-fluid levels. Mastoid air cells are clear. Other: None. CT CERVICAL SPINE FINDINGS Alignment: Normal alignment of the cervical vertebrae and facet joints. C1-2 articulation appears intact. Skull base and vertebrae: Skull base appears intact. No vertebral compression deformities. No focal bone lesion or bone destruction. Bone cortex appears intact. Soft tissues and spinal canal: No prevertebral soft tissue swelling. No abnormal soft tissue paraspinal mass or infiltration. Disc levels: Degenerative changes in the cervical spine with narrowed disc spaces and associated endplate hypertrophic changes. Degenerative changes in the facet joints and at C1-2. Upper chest: Motion artifact limits examination. Possible patchy airspace infiltrates in the right apex. Edema versus pneumonia. Other: None. IMPRESSION: 1. No acute intracranial abnormalities. Chronic atrophy and small vessel ischemic changes. 2. Normal alignment of the cervical spine. Degenerative changes. No acute displaced fractures identified. Electronically Signed   By: Lucienne Capers M.D.   On: 12/30/2017 21:56   Ct Cervical Spine Wo Contrast  Result Date: 12/30/2017 CLINICAL DATA:  Unwitnessed fall. Altered mental status with bruising to the arms, elbows, and face. EXAM: CT HEAD WITHOUT CONTRAST CT CERVICAL SPINE WITHOUT CONTRAST TECHNIQUE:  Multidetector CT imaging of the head and cervical spine was performed following the standard protocol without intravenous contrast. Multiplanar CT image reconstructions of the cervical spine were also generated. COMPARISON:  None. FINDINGS: CT HEAD FINDINGS Brain: Mild diffuse cerebral atrophy. Mild ventricular dilatation consistent with central atrophy. Low-attenuation changes in the deep white matter consistent with small vessel ischemia. No mass effect or midline shift. No abnormal extra-axial fluid collections. Gray-white matter junctions are distinct. Basal cisterns are not effaced. No acute intracranial hemorrhage. Vascular: Mild intracranial arterial vascular calcifications. Skull: Calvarium appears intact. No acute depressed skull fractures. Sinuses/Orbits: Mucosal thickening in the paranasal sinuses. No acute air-fluid levels. Mastoid air cells are clear. Other: None. CT CERVICAL SPINE FINDINGS Alignment: Normal alignment of the cervical vertebrae and facet joints. C1-2 articulation appears intact. Skull base and vertebrae: Skull base appears intact. No vertebral compression deformities. No focal bone lesion or bone destruction. Bone cortex appears intact. Soft tissues and spinal canal: No prevertebral soft tissue swelling. No abnormal soft tissue paraspinal mass or infiltration. Disc levels: Degenerative changes in the cervical spine with narrowed disc spaces and associated endplate hypertrophic changes. Degenerative changes in the facet joints and at C1-2. Upper chest: Motion artifact limits examination. Possible patchy airspace infiltrates in the right apex. Edema versus pneumonia. Other: None. IMPRESSION: 1. No acute intracranial abnormalities. Chronic atrophy and small vessel ischemic changes. 2. Normal alignment of the cervical spine. Degenerative changes. No acute displaced fractures identified. Electronically Signed   By: Lucienne Capers M.D.   On: 12/30/2017 21:56     Scheduled Meds: .  aspirin EC  325 mg Oral Daily  . cholecalciferol  1,000 Units Oral Daily  . omega-3 acid ethyl esters  1 g Oral Daily   Continuous Infusions: . sodium chloride 50 mL/hr at 01/01/18 0251  . diltiazem (CARDIZEM) infusion Stopped (01/01/18 0040)  . heparin 1,050 Units/hr (01/01/18 0254)  . sodium chloride       LOS: 2 days   Time Spent in minutes   45 minutes (greater than 50% of time spent with patient face to face, discussing with patient's daughter, as well as reviewing records, calling consults, and formulating a plan)  Cristal Ford D.O. on 01/01/2018 at 10:13 AM  Between 7am to 7pm - Please see pager noted on [email or web address]  After 7pm go to [email or web address]  And look for  the night coverage person covering for me after hours  [facility] Office  [phone]

## 2018-01-01 NOTE — Progress Notes (Signed)
Progress Note  Patient Name: Danielle Hebert Date of Encounter: 01/01/2018  Primary Cardiologist:   Gwenlyn Found   Subjective   83 year old female who was admitted after being found down.  She is had altered mental status.  She developed atrial fibrillation and we were asked to see. Troponin elevations are minimally elevated-consistent with her degree of rhabdomyolysis.  She was found to have Afib yesterday She has already converted back to NSR to day  She was seen with her son today     Inpatient Medications    Scheduled Meds: . aspirin EC  325 mg Oral Daily  . cholecalciferol  1,000 Units Oral Daily  . omega-3 acid ethyl esters  1 g Oral Daily   Continuous Infusions: . sodium chloride 50 mL/hr at 01/01/18 0251  . diltiazem (CARDIZEM) infusion Stopped (01/01/18 0040)  . heparin 1,050 Units/hr (01/01/18 0254)  . magnesium sulfate 1 - 4 g bolus IVPB    . sodium chloride     PRN Meds: acetaminophen, cyclobenzaprine, ondansetron **OR** ondansetron (ZOFRAN) IV, senna-docusate, zolpidem   Vital Signs    Vitals:   01/01/18 0040 01/01/18 0300 01/01/18 0350 01/01/18 0836  BP: (!) 90/59 (!) 106/59 (!) 96/52 (!) 115/57  Pulse:  78 72 84  Resp:   18 17  Temp:   98.1 F (36.7 C) 98.6 F (37 C)  TempSrc:   Oral Oral  SpO2:   97% 96%  Weight:      Height:        Intake/Output Summary (Last 24 hours) at 01/01/2018 1130 Last data filed at 12/31/2017 2000 Gross per 24 hour  Intake 1447.45 ml  Output -  Net 1447.45 ml   Filed Weights   12/31/17 0119  Weight: 70 kg    Telemetry     NSR  - Personally Reviewed  ECG     - Personally Reviewed  Physical Exam    GEN:  elderly female, NAD  Neck: No JVD Cardiac: RRR .  Respiratory: Clear to auscultation bilaterally. GI: Soft, nontender, non-distended  MS: No edema; No deformity. Neuro:  Nonfocal  Psych: Normal affect   Labs    Chemistry Recent Labs  Lab 12/30/17 2124  12/31/17 0012 12/31/17 1631  01/01/18 0354  NA 132*   < > 132* 136 133*  K 3.9   < > 4.3 2.9* 4.4  CL 92*   < > 95* 97* 101  CO2 26  --  22 30 23   GLUCOSE 142*   < > 130* 212* 161*  BUN 27*   < > 27* 25* 31*  CREATININE 1.30*   < > 1.26* 1.72* 1.48*  CALCIUM 9.9  --  9.5 8.9 8.6*  PROT 7.2  --   --   --  5.7*  ALBUMIN 3.8  --   --   --  2.7*  AST 114*  --   --   --  55*  ALT 36  --   --   --  27  ALKPHOS 60  --   --   --  55  BILITOT 1.1  --   --   --  0.9  GFRNONAA 37*  --  39* 27* 32*  GFRAA 43*  --  45* 31* 37*  ANIONGAP 14  --  15 9 9    < > = values in this interval not displayed.     Hematology Recent Labs  Lab 12/30/17 2124 12/30/17 2133 12/31/17 0012 01/01/18 0354  WBC 20.3*  --  19.9* 12.3*  RBC 4.39  --  4.01 3.26*  HGB 13.4 15.0 12.4 10.3*  HCT 41.0 44.0 37.8 30.6*  MCV 93.4  --  94.3 93.9  MCH 30.5  --  30.9 31.6  MCHC 32.7  --  32.8 33.7  RDW 13.8  --  14.0 14.2  PLT 284  --  263 198    Cardiac Enzymes Recent Labs  Lab 12/31/17 0008 12/31/17 1153  TROPONINI 0.21* 0.13*    Recent Labs  Lab 12/30/17 2130  TROPIPOC 0.22*     BNP Recent Labs  Lab 12/31/17 0008  BNP 257.0*     DDimer No results for input(s): DDIMER in the last 168 hours.   Radiology    Dg Chest 2 View  Result Date: 12/30/2017 CLINICAL DATA:  Found down, altered EXAM: CHEST - 2 VIEW COMPARISON:  08/04/2008 FINDINGS: Trace pleural effusions. Cardiomegaly with mild diffuse interstitial opacity suggesting minimal edema. Minimal atelectasis left base. No pneumothorax. IMPRESSION: 1. Trace pleural effusions. 2. Cardiomegaly with mild diffuse interstitial opacity suggesting mild interstitial edema Electronically Signed   By: Donavan Foil M.D.   On: 12/30/2017 22:01   Ct Head Wo Contrast  Result Date: 12/30/2017 CLINICAL DATA:  Unwitnessed fall. Altered mental status with bruising to the arms, elbows, and face. EXAM: CT HEAD WITHOUT CONTRAST CT CERVICAL SPINE WITHOUT CONTRAST TECHNIQUE: Multidetector CT  imaging of the head and cervical spine was performed following the standard protocol without intravenous contrast. Multiplanar CT image reconstructions of the cervical spine were also generated. COMPARISON:  None. FINDINGS: CT HEAD FINDINGS Brain: Mild diffuse cerebral atrophy. Mild ventricular dilatation consistent with central atrophy. Low-attenuation changes in the deep white matter consistent with small vessel ischemia. No mass effect or midline shift. No abnormal extra-axial fluid collections. Gray-white matter junctions are distinct. Basal cisterns are not effaced. No acute intracranial hemorrhage. Vascular: Mild intracranial arterial vascular calcifications. Skull: Calvarium appears intact. No acute depressed skull fractures. Sinuses/Orbits: Mucosal thickening in the paranasal sinuses. No acute air-fluid levels. Mastoid air cells are clear. Other: None. CT CERVICAL SPINE FINDINGS Alignment: Normal alignment of the cervical vertebrae and facet joints. C1-2 articulation appears intact. Skull base and vertebrae: Skull base appears intact. No vertebral compression deformities. No focal bone lesion or bone destruction. Bone cortex appears intact. Soft tissues and spinal canal: No prevertebral soft tissue swelling. No abnormal soft tissue paraspinal mass or infiltration. Disc levels: Degenerative changes in the cervical spine with narrowed disc spaces and associated endplate hypertrophic changes. Degenerative changes in the facet joints and at C1-2. Upper chest: Motion artifact limits examination. Possible patchy airspace infiltrates in the right apex. Edema versus pneumonia. Other: None. IMPRESSION: 1. No acute intracranial abnormalities. Chronic atrophy and small vessel ischemic changes. 2. Normal alignment of the cervical spine. Degenerative changes. No acute displaced fractures identified. Electronically Signed   By: Lucienne Capers M.D.   On: 12/30/2017 21:56   Ct Cervical Spine Wo Contrast  Result Date:  12/30/2017 CLINICAL DATA:  Unwitnessed fall. Altered mental status with bruising to the arms, elbows, and face. EXAM: CT HEAD WITHOUT CONTRAST CT CERVICAL SPINE WITHOUT CONTRAST TECHNIQUE: Multidetector CT imaging of the head and cervical spine was performed following the standard protocol without intravenous contrast. Multiplanar CT image reconstructions of the cervical spine were also generated. COMPARISON:  None. FINDINGS: CT HEAD FINDINGS Brain: Mild diffuse cerebral atrophy. Mild ventricular dilatation consistent with central atrophy. Low-attenuation changes in the deep white matter consistent with small vessel ischemia. No mass effect  or midline shift. No abnormal extra-axial fluid collections. Gray-white matter junctions are distinct. Basal cisterns are not effaced. No acute intracranial hemorrhage. Vascular: Mild intracranial arterial vascular calcifications. Skull: Calvarium appears intact. No acute depressed skull fractures. Sinuses/Orbits: Mucosal thickening in the paranasal sinuses. No acute air-fluid levels. Mastoid air cells are clear. Other: None. CT CERVICAL SPINE FINDINGS Alignment: Normal alignment of the cervical vertebrae and facet joints. C1-2 articulation appears intact. Skull base and vertebrae: Skull base appears intact. No vertebral compression deformities. No focal bone lesion or bone destruction. Bone cortex appears intact. Soft tissues and spinal canal: No prevertebral soft tissue swelling. No abnormal soft tissue paraspinal mass or infiltration. Disc levels: Degenerative changes in the cervical spine with narrowed disc spaces and associated endplate hypertrophic changes. Degenerative changes in the facet joints and at C1-2. Upper chest: Motion artifact limits examination. Possible patchy airspace infiltrates in the right apex. Edema versus pneumonia. Other: None. IMPRESSION: 1. No acute intracranial abnormalities. Chronic atrophy and small vessel ischemic changes. 2. Normal alignment of  the cervical spine. Degenerative changes. No acute displaced fractures identified. Electronically Signed   By: Lucienne Capers M.D.   On: 12/30/2017 21:56    Cardiac Studies     Patient Profile     83 y.o. female admitted after being found down. Was admitted with rhabdomyolysis, Had minimal troponin elevations associated with the Rhabdo. No CP   Assessment & Plan    1.  Troponin elevation:  Echo shows normal LV function .  Grade 2 diastolic dysfunction  No further work up needed at this time  2.  Paroxysmal atrial fib: Has already converted back to NSR. LA is normal sized. I suspect the PAF is due to the stress of the rhabdo.   Given her hx of falling , I would hesitate to place her on long term anticoagulation after just this one episode of PAF.    We can see her in the office and continue to assess her .   If she is found to have more AF, we would need to consider [place] .  She may need SNF        For questions or updates, please contact [place] Please consult [email or web address] for contact info under        Signed, Mertie Moores, MD  01/01/2018, 11:30 AM

## 2018-01-01 NOTE — Progress Notes (Signed)
[facility]  for Heparin Indication: atrial fibrillation  Allergies  Allergen Reactions  . Ciprofloxacin   . Remicade [Infliximab]     Patient Measurements: Height: 5\' 6"  (167.6 cm) Weight: 154 lb 5.2 oz (70 kg) IBW/kg (Calculated) : 59.3 Heparin Dosing Weight: 70 kg  Vital Signs: Temp: 98.1 F (36.7 C) (10/17 0350) Temp Source: Oral (10/17 0350) BP: 96/52 (10/17 0350) Pulse Rate: 72 (10/17 0350)  Labs: Recent Labs    12/30/17 2124 12/30/17 2133 12/31/17 0008 12/31/17 0012 12/31/17 1153 12/31/17 1631 01/01/18 0354  HGB 13.4 15.0  --  12.4  --   --  10.3*  HCT 41.0 44.0  --  37.8  --   --  30.6*  PLT 284  --   --  263  --   --  198  LABPROT 14.5  --   --   --   --   --   --   INR 1.14  --   --   --   --   --   --   HEPARINUNFRC  --   --   --   --   --   --  0.39  CREATININE 1.30* 1.10*  --  1.26*  --  1.72*  --   CKTOTAL 3,293*  --   --  2,999*  --   --   --   TROPONINI  --   --  0.21*  --  0.13*  --   --     Estimated Creatinine Clearance: 24 mL/min (A) (by C-G formula based on SCr of 1.72 mg/dL (H)).  Assessment:  83 y.o. female with new onset AFib, cardiology evaluating for elevated troponin after admission for being found down at home for 2 days with altered mental status. Patient was on prophylactic dose Lovenox 40 mg, last dose today at noon. No anticoagulation PTA. CBC wnl. Initial heparin level 0.39 units/ml.  No bleeding reported  Goal of Therapy:  Heparin level 0.3-0.7 units/ml Monitor platelets by anticoagulation protocol: Yes   Plan:  Continue heparin infusion at 1050 units/hr Check anti-Xa level in 6 hours to confirm and daily while on heparin Continue to monitor H&H and platelets   Thank you for allowing [place] to participate in this patients care.   Excell Seltzer, PharmD 01/01/2018 4:53 AM

## 2018-01-01 NOTE — Progress Notes (Signed)
Physical Therapy Evaluation Patient Details Name: Danielle Hebert MRN: [id number] DOB: [birth year] Today's Date: 01/01/2018   History of Present Illness  pt is a n 83 y/o female with pmh significant for RA and CKD, admitted after being found down on the floor confused and covered in feces and visible bruising on face.  Work up including acute metabolic encephalopathy of unknown etiology, afib and elevated troponin, suspected due to acut rhabdomyolysis.  Clinical Impression  Pt admitted with/for fall, confusion and rhabdo.  Pt is weak and not at functional baseline with mild unsteadiness during gait.  Pt currently limited functionally due to the problems listed below.  (see problems list.)  Pt will benefit from PT to maximize function and safety to be able to get home safely with available assist from her son.     Follow Up Recommendations Home health PT;Supervision for mobility/OOB;Supervision/Assistance - 24 hour(Initial assist once home until she is stronger and indep.)    Equipment Recommendations  None recommended by PT    Recommendations for Other Services       Precautions / Restrictions Precautions Precautions: Fall      Mobility  Bed Mobility Overal bed mobility: Needs Assistance Bed Mobility: Supine to Sit           General bed mobility comments: up via R elbow with min assist  Transfers Overall transfer level: Needs assistance   Transfers: Sit to/from Stand Sit to Stand: Min guard            Ambulation/Gait Ambulation/Gait assistance: Min assist(min with turns/fatigue) Gait Distance (Feet): 200 Feet Assistive device: None(briefly IV pole) Gait Pattern/deviations: Step-through pattern   Gait velocity interpretation: <1.8 ft/sec, indicate of risk for recurrent falls General Gait Details: mildly unsteady overall and guarded without overt LOB, but with episodes of deviation, drift or stepping to recover balance  Stairs            Wheelchair  Mobility    Modified Rankin (Stroke Patients Only)       Balance Overall balance assessment: Needs assistance Sitting-balance support: No upper extremity supported Sitting balance-Leahy Scale: Fair     Standing balance support: No upper extremity supported;Single extremity supported Standing balance-Leahy Scale: Fair                               Pertinent Vitals/Pain Pain Assessment: Faces Faces Pain Scale: Hurts little more Pain Location: joints and muscles Pain Descriptors / Indicators: Sore Pain Intervention(s): Monitored during session    Home Living Family/patient expects to be discharged to:: Private residence Living Arrangements: Alone Available Help at Discharge: Family;Available PRN/intermittently Type of Home: House(townhome) Home Access: Level entry     Home Layout: One level Home Equipment: Walker - 2 wheels      Prior Function Level of Independence: Independent         Comments: drives, runs errands, completes her own ADL's     Hand Dominance        Extremity/Trunk Assessment   Upper Extremity Assessment Upper Extremity Assessment: Overall WFL for tasks assessed;Generalized weakness    Lower Extremity Assessment Lower Extremity Assessment: Generalized weakness(grossly 3+/4-)       Communication   Communication: No difficulties  Cognition Arousal/Alertness: Awake/alert Behavior During Therapy: WFL for tasks assessed/performed Overall Cognitive Status: Impaired/Different from baseline(functional for evaluation, cognition not tested formally)  General Comments      Exercises     Assessment/Plan    PT Assessment Patient needs continued PT services  PT Problem List Decreased strength;Decreased activity tolerance;Decreased balance;Decreased mobility;Pain       PT Treatment Interventions Gait training;Functional mobility training;Therapeutic activities;Balance  training;Patient/family education    PT Goals (Current goals can be found in the Care Plan section)  Acute Rehab PT Goals Patient Stated Goal: home  PT Goal Formulation: With patient Time For Goal Achievement: 01/08/18 Potential to Achieve Goals: Good    Frequency Min 3X/week   Barriers to discharge        Co-evaluation               AM-PAC PT "6 Clicks" Daily Activity  Outcome Measure Difficulty turning over in bed (including adjusting bedclothes, sheets and blankets)?: A Little Difficulty moving from lying on back to sitting on the side of the bed? : Unable Difficulty sitting down on and standing up from a chair with arms (e.g., wheelchair, bedside commode, etc,.)?: A Little Help needed moving to and from a bed to chair (including a wheelchair)?: A Little Help needed walking in hospital room?: A Little Help needed climbing 3-5 steps with a railing? : A Little 6 Click Score: 16    End of Session   Activity Tolerance: Patient tolerated treatment well;Patient limited by fatigue Patient left: in bed Nurse Communication: Mobility status PT Visit Diagnosis: Unsteadiness on feet (R26.81);Muscle weakness (generalized) (M62.81)    Time: 3354-5625 PT Time Calculation (min) (ACUTE ONLY): 22 min   Charges:   PT Evaluation $PT Eval Low Complexity: 1 Low          01/01/2018  Danielle Hebert, PT Acute Rehabilitation Services [phone]  (pager) [phone]  (office)  Danielle Hebert 01/01/2018, 2:06 PM

## 2018-01-01 NOTE — Progress Notes (Signed)
Patient converted to NSR with rates in the 70's.

## 2018-01-01 NOTE — Progress Notes (Signed)
PT Cancellation Note  Patient Details Name: Danielle Hebert MRN: [id number] DOB: 19-Aug-1934   Cancelled Treatment:    Reason Eval/Treat Not Completed: Patient not medically ready.  Troponin value trending up.  Will await clearance for activity or downward trend.   01/01/2018  Donnella Sham, PT Acute Rehabilitation Services [phone]  (pager) [phone]  (office)   [name] [name] 01/01/2018, 10:25 AM

## 2018-01-02 LAB — BASIC METABOLIC PANEL
Anion gap: 6 (ref 5–15)
BUN: 22 mg/dL (ref 8–23)
CALCIUM: 8.5 mg/dL — AB (ref 8.9–10.3)
CO2: 22 mmol/L (ref 22–32)
Chloride: 109 mmol/L (ref 98–111)
Creatinine, Ser: 1.05 mg/dL — ABNORMAL HIGH (ref 0.44–1.00)
GFR, EST AFRICAN AMERICAN: 56 mL/min — AB (ref >60)
GFR, EST NON AFRICAN AMERICAN: 48 mL/min — AB (ref >60)
Glucose, Bld: 120 mg/dL — ABNORMAL HIGH (ref 70–99)
Potassium: 4.6 mmol/L (ref 3.5–5.1)
SODIUM: 137 mmol/L (ref 135–145)

## 2018-01-02 LAB — HEPARIN LEVEL (UNFRACTIONATED): HEPARIN UNFRACTIONATED: 0.35 [IU]/mL (ref 0.30–0.70)

## 2018-01-02 LAB — CBC
HCT: 28.5 % — ABNORMAL LOW (ref 36.0–46.0)
Hemoglobin: 9.1 g/dL — ABNORMAL LOW (ref 12.0–15.0)
MCH: 30.4 pg (ref 26.0–34.0)
MCHC: 31.9 g/dL (ref 30.0–36.0)
MCV: 95.3 fL (ref 80.0–100.0)
PLATELETS: 230 10*3/uL (ref 150–400)
RBC: 2.99 MIL/uL — ABNORMAL LOW (ref 3.87–5.11)
RDW: 14.5 % (ref 11.5–15.5)
WBC: 10.8 10*3/uL — AB (ref 4.0–10.5)
nRBC: 0 % (ref 0.0–0.2)

## 2018-01-02 MED ORDER — METOPROLOL TARTRATE 25 MG PO TABS
25.0000 mg | ORAL_TABLET | Freq: Two times a day (BID) | ORAL | Status: DC
  Administered 2018-01-02 – 2018-01-03 (×2): 25 mg via ORAL
  Filled 2018-01-02 (×2): qty 1

## 2018-01-02 NOTE — Progress Notes (Addendum)
Paged Dr. Gwenlyn Found to inform him of patient converting into A.fib this morning now rate in the 150's. Orders to restart cardizem drip.

## 2018-01-02 NOTE — Evaluation (Signed)
Occupational Therapy Evaluation and Discharge Patient Details Name: Danielle Hebert MRN: [id number] DOB: 1934/10/13 Today's Date: 01/02/2018    History of Present Illness Pt is an 83 y/o female with PMH significant for RA and CKD, admitted after being found down on the floor confused and covered in feces and visible bruising on face.  Work up including acute metabolic encephalopathy of unknown etiology, afib and elevated troponin, suspected due to acute rhabdomyolysis.   Clinical Impression   This 83 yo female admitted with above presents to acute OT at a setup/S level and will have this at home upon D/C per her son who was in her room. HHOT is recommended. Acute OT will sign off.    Follow Up Recommendations  Home health OT;Supervision/Assistance - 24 hour    Equipment Recommendations  3 in 1 bedside commode       Precautions / Restrictions Precautions Precautions: Fall Precaution Comments: watch HR (was on IV drip in OT PM sesson HR 109 at highest) Restrictions Weight Bearing Restrictions: No      Mobility Bed Mobility Overal bed mobility: Needs Assistance Bed Mobility: Supine to Sit     Supine to sit: Supervision        Transfers Overall transfer level: Needs assistance Equipment used: Rolling walker (2 wheeled) Transfers: Sit to/from Stand Sit to Stand: Supervision                  ADL either performed or assessed with clinical judgement   ADL Overall ADL's : Needs assistance/impaired                                       General ADL Comments: set up/S for all basic ADLs     Vision Patient Visual Report: No change from baseline              Pertinent Vitals/Pain Pain Assessment: Faces Faces Pain Scale: Hurts little more Pain Location: legs Pain Descriptors / Indicators: Sore;Tightness Pain Intervention(s): Limited activity within patient's tolerance;Monitored during session;Repositioned     Hand Dominance Right    Extremity/Trunk Assessment Upper Extremity Assessment Upper Extremity Assessment: Overall WFL for tasks assessed           Communication Communication Communication: No difficulties   Cognition Arousal/Alertness: Awake/alert Behavior During Therapy: WFL for tasks assessed/performed Overall Cognitive Status: Within Functional Limits for tasks assessed                                                Home Living Family/patient expects to be discharged to:: Private residence Living Arrangements: Alone Available Help at Discharge: Family;Available PRN/intermittently Type of Home: House(townhome) Home Access: Level entry     Home Layout: One level     Bathroom Shower/Tub: Occupational psychologist: Standard     Home Equipment: Environmental consultant - 2 wheels;Shower seat;Hand held shower head;Grab bars - tub/shower          Prior Functioning/Environment Level of Independence: Independent        Comments: drives, runs errands, completes her own ADL's        OT Problem List: Impaired balance (sitting and/or standing);Pain         OT Goals(Current goals can be found in the care plan section) Acute Rehab OT  Goals Patient Stated Goal: to go home  OT Frequency:                AM-PAC PT "6 Clicks" Daily Activity     Outcome Measure Help from another person eating meals?: None Help from another person taking care of personal grooming?: A Little Help from another person toileting, which includes using toliet, bedpan, or urinal?: A Little Help from another person bathing (including washing, rinsing, drying)?: A Little Help from another person to put on and taking off regular upper body clothing?: A Little Help from another person to put on and taking off regular lower body clothing?: A Little 6 Click Score: 19   End of Session Equipment Utilized During Treatment: Gait belt;Rolling walker  Activity Tolerance: Patient tolerated treatment well Patient  left: in chair;with call bell/phone within reach;with chair alarm set;with family/visitor present  OT Visit Diagnosis: Unsteadiness on feet (R26.81);Pain Pain - Right/Left: (right and left) Pain - part of body: Leg                Time: 2811-8867 OT Time Calculation (min): 28 min Charges:  OT General Charges $OT Visit: 1 Visit OT Evaluation $OT Eval Moderate Complexity: 1 Mod OT Treatments $Self Care/Home Management : 8-22 mins  Golden Circle, OTR/L Acute [employer] Pager [phone] Office [phone]

## 2018-01-02 NOTE — Progress Notes (Signed)
Pt converted back to A Fib with HR fluctuates in 94-112. Denies any chest pain or SOB. Gaetano Net made aware.

## 2018-01-02 NOTE — Progress Notes (Addendum)
Progress Note  Patient Name: Danielle Hebert Date of Encounter: 01/02/2018  Primary Cardiologist: Quay Burow, MD  New this admission  Subjective   The patient denies any complaints.  She has no chest discomfort, shortness of breath or palpitations.  She has no awareness of being in A. fib.  She had some mild lightheadedness when she got up to the bathroom this morning.  Inpatient Medications    Scheduled Meds: . aspirin EC  325 mg Oral Daily  . cholecalciferol  1,000 Units Oral Daily  . omega-3 acid ethyl esters  1 g Oral Daily   Continuous Infusions: . diltiazem (CARDIZEM) infusion 5 mg/hr (01/02/18 0954)  . heparin 1,050 Units/hr (01/01/18 2247)  . sodium chloride     PRN Meds: acetaminophen, cyclobenzaprine, ondansetron **OR** ondansetron (ZOFRAN) IV, senna-docusate, zolpidem   Vital Signs    Vitals:   01/01/18 2121 01/02/18 0123 01/02/18 0344 01/02/18 0846  BP: 129/60 115/61 123/74 134/86  Pulse: 90 81  (!) 143  Resp: 18  18 20   Temp: 99.4 F (37.4 C) 99 F (37.2 C) 99.4 F (37.4 C) 98.2 F (36.8 C)  TempSrc: Oral Oral Oral Oral  SpO2: 96% 94% 96% 95%  Weight:      Height:        Intake/Output Summary (Last 24 hours) at 01/02/2018 1019 Last data filed at 01/02/2018 0844 Gross per 24 hour  Intake 480 ml  Output -  Net 480 ml   Filed Weights   12/31/17 0119  Weight: 70 kg    Telemetry    Patient went into A. fib in the 80s this morning at around 6 AM and rate jumped up to the 150s this morning around 8 AM, now in the 130s- Personally Reviewed  ECG    No new tracings- Personally Reviewed  Physical Exam   GEN: No acute distress.  Multiple facial bruises  Neck: No JVD Cardiac:  Irregularly irregular rhythm, tachycardic, 2/6 systolic murmurs at left upper sternal border Respiratory: Clear to auscultation bilaterally. GI: Soft, nontender, non-distended  MS: No edema; No deformity. Neuro:  Nonfocal  Psych: Normal affect   Labs      Chemistry Recent Labs  Lab 12/30/17 2124  12/31/17 1631 01/01/18 0354 01/02/18 0337  NA 132*   < > 136 133* 137  K 3.9   < > 2.9* 4.4 4.6  CL 92*   < > 97* 101 109  CO2 26   < > 30 23 22   GLUCOSE 142*   < > 212* 161* 120*  BUN 27*   < > 25* 31* 22  CREATININE 1.30*   < > 1.72* 1.48* 1.05*  CALCIUM 9.9   < > 8.9 8.6* 8.5*  PROT 7.2  --   --  5.7*  --   ALBUMIN 3.8  --   --  2.7*  --   AST 114*  --   --  55*  --   ALT 36  --   --  27  --   ALKPHOS 60  --   --  55  --   BILITOT 1.1  --   --  0.9  --   GFRNONAA 37*   < > 27* 32* 48*  GFRAA 43*   < > 31* 37* 56*  ANIONGAP 14   < > 9 9 6    < > = values in this interval not displayed.     Hematology Recent Labs  Lab 12/31/17 0012 01/01/18 0354 01/02/18  0337  WBC 19.9* 12.3* 10.8*  RBC 4.01 3.26* 2.99*  HGB 12.4 10.3* 9.1*  HCT 37.8 30.6* 28.5*  MCV 94.3 93.9 95.3  MCH 30.9 31.6 30.4  MCHC 32.8 33.7 31.9  RDW 14.0 14.2 14.5  PLT 263 198 230    Cardiac Enzymes Recent Labs  Lab 12/31/17 0008 12/31/17 1153  TROPONINI 0.21* 0.13*    Recent Labs  Lab 12/30/17 2130  TROPIPOC 0.22*     BNP Recent Labs  Lab 12/31/17 0008  BNP 257.0*     DDimer No results for input(s): DDIMER in the last 168 hours.   Radiology    No results found.  Cardiac Studies   Echocardiogram 12/31/2017 Study Conclusions - Left ventricle: The cavity size was normal. There was mild focal   basal hypertrophy of the septum. Systolic function was normal.   The estimated ejection fraction was in the range of 55% to 60%.   Wall motion was normal; there were no regional wall motion   abnormalities. Features are consistent with a pseudonormal left   ventricular filling pattern, with concomitant abnormal relaxation   and increased filling pressure (grade 2 diastolic dysfunction).   Doppler parameters are consistent with high ventricular filling   pressure. - Aortic valve: Transvalvular velocity was within the normal range.   There was  no stenosis. There was no regurgitation. - Mitral valve: Transvalvular velocity was within the normal range.   There was no evidence for stenosis. There was trivial   regurgitation. - Right ventricle: The cavity size was normal. Wall thickness was   normal. Systolic function was normal. - Tricuspid valve: There was trivial regurgitation. - Pulmonary arteries: Systolic pressure was within the normal   range. PA peak pressure: 22 mm Hg (S). - Pericardium, extracardiac: A trivial pericardial effusion was   identified. - Global longitudinal strain -18.1% (normal).  Patient Profile     83 y.o. female with history of rheumatoid arthritis and hyperlipidemia.  Admitted after being found down and has had altered mental status. Mildly elevated troponin in setting of rhabdomyolysis.  She was found to be in A. fib which subsequently converted to sinus rhythm, however she is back in A. fib with RVR this morning.  Assessment & Plan    Paroxysmal atrial fibrillation with RVR -On presentation she was in atrial fibrillation which subsequently converted to normal sinus rhythm, however this morning she has gone back into A. fib with RVR. -Question whether episodes of A. fib with RVR contributed to her altered mental status and fall. -A Cardizem drip has been initiated, currently at 5 mg/h.  A. fib rates in the 150s down to the 130s.  Continue IV Cardizem to target rate control and then switch to oral dosing.  I will ask the nurse to increase the current IV rate for better control. -Blood pressure was low overnight but stable this morning -CHA2DS2/VAS Stroke Risk score is 3 for age and being female.  She is currently on heparin drip.  Long-term anticoagulation should be considered for stroke risk reduction.  The patient denies frequent falls.  Other than this she said she has had one other fall in the past when she slipped in the shower.  I discussed anticoagulation with her and she agrees to the benefit of  stroke risk reduction and taking anticoagulation.  If in the future she has more falls, we would need to revisit continuation of anticoagulation. -Serum creatinine was elevated initially in setting of rhabdomyolysis.  Creatinine is down to  1.05 today. -Recommend Eliquis 5 mg twice daily for anticoagulation.  Would need to watch renal function closely if serum creatinine goes over 1.5 she would need reduced dose. -Stop aspirin with anticoagulation   Troponin elevation -Troponins 0.21, 0.13, likely related to her rhabdomyolysis with significantly elevated CK.  Also tachyarrhythmia could have contributed. -Echocardiogram shows normal LV systolic function with grade 2 diastolic dysfunction. -No further work-up at this time      For questions or updates, please contact [place] Please consult [email or web address] for contact info under        Signed, Daune Perch, NP  01/02/2018, 10:19 AM     Attending Note:   The patient was seen and examined.  Agree with assessment and plan as noted above.  Changes made to the above note as needed.  Patient seen and independently examined with Pecolia Ades, NP .   We discussed all aspects of the encounter. I agree with the assessment and plan as stated above.  Paroxysmal atrial fibrillation: The patient was in atrial fibrillation this morning but has already gone back into normal sinus rhythm tonight. It appears that this atrial fibrillation is due to the stress of falling and being in the hospital.   I discussed with the patient and her son.  She really does not have a history of falling except for this 1 time.  We will keep her on heparin and make further decisions regarding Eliquis later in hospitalization.  It might be beneficial to have her evaluated by physical therapy to see how steady she is.  She is on Cardizem drip.  We will start her on metoprolol 25 mg twice a day and will discontinue the diltiazem drip tonight.    I have spent a total  of 40 minutes with patient reviewing hospital  notes , telemetry, EKGs, labs and examining patient as well as establishing an assessment and plan that was discussed with the patient. > 50% of time was spent in direct patient care.    Thayer Headings, Brooke Bonito., MD, [facility] 01/02/2018, 5:29 PM [address],  [place] Pager [phone]

## 2018-01-02 NOTE — Progress Notes (Addendum)
ANTICOAGULATION CONSULT NOTE - Follow Up Consult  Pharmacy Consult for heparin Indication: atrial fibrillation  Allergies  Allergen Reactions  . Ciprofloxacin   . Remicade [Infliximab]     Patient Measurements: Height: 5\' 6"  (167.6 cm) Weight: 154 lb 5.2 oz (70 kg) IBW/kg (Calculated) : 59.3 Heparin Dosing Weight: 70kg  Vital Signs: Temp: 98.3 F (36.8 C) (10/18 1139) Temp Source: Oral (10/18 1139) BP: 103/62 (10/18 1139) Pulse Rate: 109 (10/18 1139)  Labs: Recent Labs    12/30/17 2124  12/31/17 0008 12/31/17 0012 12/31/17 1153 12/31/17 1631 01/01/18 0354 01/01/18 0959 01/02/18 0337  HGB 13.4   < >  --  12.4  --   --  10.3*  --  9.1*  HCT 41.0   < >  --  37.8  --   --  30.6*  --  28.5*  PLT 284  --   --  263  --   --  198  --  230  LABPROT 14.5  --   --   --   --   --   --   --   --   INR 1.14  --   --   --   --   --   --   --   --   HEPARINUNFRC  --   --   --   --   --   --  0.39 0.44 0.35  CREATININE 1.30*   < >  --  1.26*  --  1.72* 1.48*  --  1.05*  CKTOTAL 3,293*  --   --  2,999*  --   --  592*  --   --   TROPONINI  --   --  0.21*  --  0.13*  --   --   --   --    < > = values in this interval not displayed.    Estimated Creatinine Clearance: 39.3 mL/min (A) (by C-G formula based on SCr of 1.05 mg/dL (H)).  Assessment: Pt has new onset AFib, started heparin 1050 units/hr 10/17. Pt's heparin level is therapeutic at current rate. Per cardiology NP, possible transition to Eliquis. No anticoagulation PTA. H&H decreasing, platelets stable. No bleeding noted.  HL 0.35  Goal of Therapy:  Heparin level 0.3-0.7 units/ml Monitor platelets by anticoagulation protocol: Yes    Plan:  Continue heparin 1050 units/hr Daily heparin level, CBC, s/s bleeding F/U transition to oral anticoagulation  Ladoris Gene 01/02/2018,12:24 PM   I discussed / reviewed the pharmacy note by Pharm D candidate, Ladoris Gene and I agree with the resident's findings and plans as  documented.  Nicole Cella, RPh Clinical Pharmacist Please check AMION for all [place] phone numbers After 10:00 PM, call [place] [phone] 01/02/2018 1:00 PM

## 2018-01-02 NOTE — Progress Notes (Signed)
PROGRESS NOTE    Danielle Hebert  DUK:[id number] DOB: 04/24/1934 DOA: 12/30/2017 PCP: Maurice Small, MD   Brief Narrative:  HPI On 12/30/2017 by Dr. Ivor Costa Danielle Hebert is a 83 y.o. female with medical history significant of hyperlipidemia, rheumatoid arthritis on Enbrel injection, CKD 3, who presents with altered mental status and fall.  Per her daughter, pt was last seen in normal health 2 days ago. Relative tried to call her yesterday and did not get a hold of her. They tried several times again today and became worried, so went to check on her. Pt was found on the ground at her house, confused and covered in feces.  She has bruises in her face and forehead.  Per daughter, patient normally is alert and oriented x3.  Currently patient is oriented to place, but not to the time and place.  Patient moves all extremities normally.  No facial droop, slurred speech.  She denies any pain anywhere. No acute respiratory distress, shortness breath, nausea, vomiting, diarrhea noted.  Interim history Patient admitted for acute encephalopathy along with fall.  Also found to have an elevated troponin of 0.22.  Work-up underway. Noted to have new Atrial fibrillation. Assessment & Plan   Acute metabolic encephalopathy with fall -Unknown etiology, possible medication vs Afib -CT head and C-spine negative for acute issues -Able to move all extremities without any focal deficits -Patient noted to have leukocytosis on admission, WBC of 20 however no fever. -Chest x-ray reviewed and unremarkable for infection, however trace pleural effusion -UA + trace leukocytes, however unremarkable for infection -hemoglobin A1c 5.8 -Blood cultures show no growth to date -PT and OT consulted, rec home health  Atrial fibrillation, paroxsymal  -new onset -?could patient have had an episode of AF with hypotension which led to her fall -Cardiology consulted and appreciated -no complaints of chest pain or  palpitations -CHADSVASC 3 based on age and gender -currently on heparin- will likely need oral AC- discussed with patient and she will think about it -TSH 1.563 (WNL), K was replaced and up to 4.6 now -Unfortunately went back into AF RVR today, and placed back on cardizem drip -pending further recommendations from cardiology   Elevated troponin -Denies chest pain -Troponin on admission 0.21, trending downward to 0.13 -Suspect secondary to acute rhabdomyolysis, CK level was 3293 on admission -Echocardiogram EF 37-62%, grade 2 diastolic dysfunction. No RWMA -BNP 257 on admission -Cardiology consulted and appreciated -Chest x-ray does show trace pleural effusion  Acute rhabdomyolysis -As above, CK level 3293 on admission and trended down to 592 -Continue gentle IV fluid hydration  Elevated LFTs -Patient has been on methotrexate for rheumatoid arthritis -Hepatitis panel negative -Statin held  Leukocytosis -possibly reactive, resolved -UA and CXR unremarkable for infection -WBC down from 20.3 to 10.8 without use of antibiotics -continue to monitor CBC  Chronic kidney disease, stage III -Creatinine appears to be at baseline and stable, currently 1.05 -continue to monitor BMP  Rheumatoid arthritis -Patient with notable ulnar deviation on exam -Patient takes weekly methotrexate injections as well as Enbrel injections  Hypokalemia -replaced, currently 4.6 -continue to monitor BMP  Normocytic anemia -hemoglobin down to 9.1 today, suspect dilutional component -no signs of bleeding  -will discontinue IVF and monitor CBC  DVT Prophylaxis Lovenox  Code Status: Full  Family Communication: None at bedside.   Disposition Plan: Admitted. Back in AF and on cardizem drip. Pending further cardiology recommendations. Suspect home with home health when stable.   Consultants Cardiology  Procedures  Echocardiogram  Antibiotics   Anti-infectives (From admission, onward)   None        Subjective:   Danielle Hebert seen and examined today.  Denies pain, chest pain, shortness of breath, palpitations, abdominal pain, N/V/D/C, headache.    Objective:   Vitals:   01/02/18 0123 01/02/18 0344 01/02/18 0846 01/02/18 1139  BP: 115/61 123/74 134/86 103/62  Pulse: 81  (!) 143 (!) 109  Resp:  18 20 16   Temp: 99 F (37.2 C) 99.4 F (37.4 C) 98.2 F (36.8 C) 98.3 F (36.8 C)  TempSrc: Oral Oral Oral Oral  SpO2: 94% 96% 95% 94%  Weight:      Height:        Intake/Output Summary (Last 24 hours) at 01/02/2018 1424 Last data filed at 01/02/2018 1223 Gross per 24 hour  Intake 360 ml  Output -  Net 360 ml   Filed Weights   12/31/17 0119  Weight: 70 kg   Exam  General: Well developed, well nourished, NAD, appears stated age  2: NCAT, left chin bruising, mucous membranes moist.   Cardiovascular: S1 S2 auscultated, no murmur, irregularly irregular  Respiratory: Clear to auscultation bilaterally with equal chest rise  Abdomen: Soft, nontender, nondistended, + bowel sounds  Extremities: warm dry without cyanosis clubbing or edema  Neuro: AAOx3, nonfocal  Psych: Normal affect and demeanor, pleasant   Data Reviewed: I have personally reviewed following labs and imaging studies  CBC: Recent Labs  Lab 12/30/17 2124 12/30/17 2133 12/31/17 0012 01/01/18 0354 01/02/18 0337  WBC 20.3*  --  19.9* 12.3* 10.8*  NEUTROABS 19.1*  --   --   --   --   HGB 13.4 15.0 12.4 10.3* 9.1*  HCT 41.0 44.0 37.8 30.6* 28.5*  MCV 93.4  --  94.3 93.9 95.3  PLT 284  --  263 198 295   Basic Metabolic Panel: Recent Labs  Lab 12/30/17 2124 12/30/17 2133 12/31/17 0012 12/31/17 1631 01/01/18 0354 01/02/18 0337  NA 132* 131* 132* 136 133* 137  K 3.9 4.2 4.3 2.9* 4.4 4.6  CL 92* 94* 95* 97* 101 109  CO2 26  --  22 30 23 22   GLUCOSE 142* 138* 130* 212* 161* 120*  BUN 27* 36* 27* 25* 31* 22  CREATININE 1.30* 1.10* 1.26* 1.72* 1.48* 1.05*  CALCIUM 9.9  --  9.5  8.9 8.6* 8.5*  MG  --   --   --  1.8  --   --    GFR: Estimated Creatinine Clearance: 39.3 mL/min (A) (by C-G formula based on SCr of 1.05 mg/dL (H)). Liver Function Tests: Recent Labs  Lab 12/30/17 2124 01/01/18 0354  AST 114* 55*  ALT 36 27  ALKPHOS 60 55  BILITOT 1.1 0.9  PROT 7.2 5.7*  ALBUMIN 3.8 2.7*   No results for input(s): LIPASE, AMYLASE in the last 168 hours. Recent Labs  Lab 12/31/17 0009  AMMONIA 14   Coagulation Profile: Recent Labs  Lab 12/30/17 2124  INR 1.14   Cardiac Enzymes: Recent Labs  Lab 12/30/17 2124 12/31/17 0008 12/31/17 0012 12/31/17 1153 01/01/18 0354  CKTOTAL 3,293*  --  2,999*  --  592*  TROPONINI  --  0.21*  --  0.13*  --    BNP (last 3 results) No results for input(s): PROBNP in the last 8760 hours. HbA1C: Recent Labs    12/31/17 0010  HGBA1C 5.8*   CBG: No results for input(s): GLUCAP in the last 168 hours. Lipid Profile:  Recent Labs    12/31/17 0010  CHOL 186  HDL 69  LDLCALC 103*  TRIG 68  CHOLHDL 2.7   Thyroid Function Tests: Recent Labs    12/31/17 1631  TSH 1.563   Anemia Panel: No results for input(s): VITAMINB12, FOLATE, FERRITIN, TIBC, IRON, RETICCTPCT in the last 72 hours. Urine analysis:    Component Value Date/Time   COLORURINE YELLOW 12/31/2017 0437   APPEARANCEUR HAZY (A) 12/31/2017 0437   LABSPEC 1.018 12/31/2017 0437   PHURINE 5.0 12/31/2017 0437   GLUCOSEU NEGATIVE 12/31/2017 0437   HGBUR SMALL (A) 12/31/2017 0437   BILIRUBINUR NEGATIVE 12/31/2017 0437   KETONESUR 5 (A) 12/31/2017 0437   PROTEINUR NEGATIVE 12/31/2017 0437   UROBILINOGEN 0.2 08/04/2008 1120   NITRITE NEGATIVE 12/31/2017 0437   LEUKOCYTESUR TRACE (A) 12/31/2017 0437   Sepsis Labs: @LABRCNTIP (procalcitonin:4,lacticidven:4)  ) Recent Results (from the past 240 hour(s))  Culture, blood (Routine X 2) w Reflex to ID Panel     Status: None (Preliminary result)   Collection Time: 12/31/17 12:09 AM  Result Value Ref  Range Status   Specimen Description BLOOD RIGHT ANTECUBITAL  Final   Special Requests   Final    BOTTLES DRAWN AEROBIC AND ANAEROBIC Blood Culture results may not be optimal due to an inadequate volume of blood received in culture bottles   Culture   Final    NO GROWTH 2 DAYS Performed at [facility], [place] [address] [place], [place] [address]    Report Status PENDING  Incomplete  Culture, blood (Routine X 2) w Reflex to ID Panel     Status: None (Preliminary result)   Collection Time: 12/31/17 12:17 AM  Result Value Ref Range Status   Specimen Description BLOOD LEFT HAND  Final   Special Requests   Final    BOTTLES DRAWN AEROBIC AND ANAEROBIC Blood Culture results may not be optimal due to an inadequate volume of blood received in culture bottles   Culture   Final    NO GROWTH 2 DAYS Performed at [facility], [place] [address], [place], [place] [address]    Report Status PENDING  Incomplete      Radiology Studies: No results found.   Scheduled Meds: . aspirin EC  325 mg Oral Daily  . cholecalciferol  1,000 Units Oral Daily  . omega-3 acid ethyl esters  1 g Oral Daily   Continuous Infusions: . diltiazem (CARDIZEM) infusion 7.5 mg/hr (01/02/18 1130)  . heparin 1,050 Units/hr (01/01/18 2247)  . sodium chloride       LOS: 3 days   Time Spent in minutes   45 minutes (greater than 50% of time spent with patient face to face, discussing with patient's daughter, as well as reviewing records, calling consults, and formulating a plan)  Cristal Ford D.O. on 01/02/2018 at 2:24 PM  Between 7am to 7pm - Please see pager noted on [email or web address]  After 7pm go to [email or web address]  And look for the night coverage person covering for me after hours  [facility] Office  [phone]

## 2018-01-02 NOTE — Progress Notes (Signed)
Physical Therapy Treatment Patient Details Name: Danielle Hebert MRN: [id number] DOB: [birth year] Today's Date: 01/02/2018    History of Present Illness Pt is an 83 y/o female with PMH significant for RA and CKD, admitted after being found down on the floor confused and covered in feces and visible bruising on face.  Work up including acute metabolic encephalopathy of unknown etiology, afib and elevated troponin, suspected due to acute rhabdomyolysis.    PT Comments    Pt very limited this session secondary to elevated HR (as high as 170 bpm with minimal activity). Pt requesting assistance to toilet to attempt to have a BM. Pt's RN aware. Pt would continue to benefit from skilled physical therapy services at this time while admitted and after d/c to address the below listed limitations in order to improve overall safety and independence with functional mobility.    Follow Up Recommendations  Home health PT;Supervision - Intermittent;Supervision/Assistance - 24 hour;Other (comment)(initially 24/7 for safety)     Equipment Recommendations  None recommended by PT    Recommendations for Other Services       Precautions / Restrictions Precautions Precautions: Fall Precaution Comments: watch HR Restrictions Weight Bearing Restrictions: No    Mobility  Bed Mobility Overal bed mobility: Needs Assistance Bed Mobility: Supine to Sit     Supine to sit: Supervision     General bed mobility comments: supervision for safety  Transfers Overall transfer level: Needs assistance Equipment used: None Transfers: Sit to/from Stand Sit to Stand: Min guard         General transfer comment: for safety  Ambulation/Gait Ambulation/Gait assistance: Min guard Gait Distance (Feet): 20 Feet Assistive device: IV Pole Gait Pattern/deviations: Step-through pattern Gait velocity: decreased Gait velocity interpretation: <1.31 ft/sec, indicative of household ambulator General Gait Details:  pt steady while pushing IV pole; requesting assistance to bathroom to attempt to have a BM. Of note, pt's HR elevated to as high as 170 bpm (fluctuating from low 130's to 170's throughout - RN notified)   Stairs             Wheelchair Mobility    Modified Rankin (Stroke Patients Only)       Balance Overall balance assessment: Needs assistance Sitting-balance support: No upper extremity supported Sitting balance-Leahy Scale: Good     Standing balance support: During functional activity;Single extremity supported;Bilateral upper extremity supported Standing balance-Leahy Scale: Poor                              Cognition Arousal/Alertness: Awake/alert Behavior During Therapy: WFL for tasks assessed/performed Overall Cognitive Status: Within Functional Limits for tasks assessed                                        Exercises      General Comments        Pertinent Vitals/Pain Pain Assessment: No/denies pain    Home Living                      Prior Function            PT Goals (current goals can now be found in the care plan section) Acute Rehab PT Goals PT Goal Formulation: With patient Time For Goal Achievement: 01/08/18 Potential to Achieve Goals: Good Progress towards PT goals: Progressing toward goals    Frequency  Min 3X/week      PT Plan Current plan remains appropriate    Co-evaluation              AM-PAC PT "6 Clicks" Daily Activity  Outcome Measure  Difficulty turning over in bed (including adjusting bedclothes, sheets and blankets)?: None Difficulty moving from lying on back to sitting on the side of the bed? : None Difficulty sitting down on and standing up from a chair with arms (e.g., wheelchair, bedside commode, etc,.)?: A Little Help needed moving to and from a bed to chair (including a wheelchair)?: A Little Help needed walking in hospital room?: A Little Help needed climbing 3-5  steps with a railing? : A Little 6 Click Score: 20    End of Session   Activity Tolerance: Patient limited by fatigue;Treatment limited secondary to medical complications (Comment);Other (comment)(HR elevated) Patient left: with call bell/phone within reach;Other (comment)(sitting on toilet to attempt to have a BM) Nurse Communication: Mobility status;Other (comment)(HR increasing with activity; pt on toilet) PT Visit Diagnosis: Unsteadiness on feet (R26.81);Muscle weakness (generalized) (M62.81)     Time: 3818-2993 PT Time Calculation (min) (ACUTE ONLY): 10 min  Charges:  $Therapeutic Activity: 8-22 mins                     Sherie Don, PT, DPT  Acute Rehabilitation Services Pager [phone] Office [place] 01/02/2018, 10:18 AM

## 2018-01-03 ENCOUNTER — Other Ambulatory Visit: Payer: Self-pay

## 2018-01-03 LAB — COMPREHENSIVE METABOLIC PANEL
ALBUMIN: 2.3 g/dL — AB (ref 3.5–5.0)
ALT: 40 U/L (ref 0–44)
AST: 40 U/L (ref 15–41)
Alkaline Phosphatase: 53 U/L (ref 38–126)
Anion gap: 7 (ref 5–15)
BUN: 24 mg/dL — AB (ref 8–23)
CHLORIDE: 107 mmol/L (ref 98–111)
CO2: 24 mmol/L (ref 22–32)
CREATININE: 1.19 mg/dL — AB (ref 0.44–1.00)
Calcium: 8.4 mg/dL — ABNORMAL LOW (ref 8.9–10.3)
GFR calc Af Amer: 48 mL/min — ABNORMAL LOW (ref >60)
GFR calc non Af Amer: 42 mL/min — ABNORMAL LOW (ref >60)
GLUCOSE: 101 mg/dL — AB (ref 70–99)
Potassium: 4.3 mmol/L (ref 3.5–5.1)
SODIUM: 138 mmol/L (ref 135–145)
Total Bilirubin: 0.6 mg/dL (ref 0.3–1.2)
Total Protein: 5.4 g/dL — ABNORMAL LOW (ref 6.5–8.1)

## 2018-01-03 LAB — CBC
HEMATOCRIT: 27.4 % — AB (ref 36.0–46.0)
Hemoglobin: 8.7 g/dL — ABNORMAL LOW (ref 12.0–15.0)
MCH: 30.7 pg (ref 26.0–34.0)
MCHC: 31.8 g/dL (ref 30.0–36.0)
MCV: 96.8 fL (ref 80.0–100.0)
Platelets: 245 10*3/uL (ref 150–400)
RBC: 2.83 MIL/uL — ABNORMAL LOW (ref 3.87–5.11)
RDW: 14.6 % (ref 11.5–15.5)
WBC: 8.9 10*3/uL (ref 4.0–10.5)
nRBC: 0 % (ref 0.0–0.2)

## 2018-01-03 LAB — FERRITIN: FERRITIN: 1306 ng/mL — AB (ref 11–307)

## 2018-01-03 LAB — FOLATE: Folate: 9.7 ng/mL (ref >5.9)

## 2018-01-03 LAB — VITAMIN B12: Vitamin B-12: 268 pg/mL (ref 180–914)

## 2018-01-03 LAB — HEPARIN LEVEL (UNFRACTIONATED): HEPARIN UNFRACTIONATED: 0.29 [IU]/mL — AB (ref 0.30–0.70)

## 2018-01-03 LAB — IRON AND TIBC
Iron: 57 ug/dL (ref 28–170)
SATURATION RATIOS: 31 % (ref 10.4–31.8)
TIBC: 182 ug/dL — ABNORMAL LOW (ref 250–450)
UIBC: 125 ug/dL

## 2018-01-03 LAB — RETICULOCYTES
Immature Retic Fract: 16 % — ABNORMAL HIGH (ref 2.3–15.9)
RBC.: 2.97 MIL/uL — ABNORMAL LOW (ref 3.87–5.11)
RETIC COUNT ABSOLUTE: 57 10*3/uL (ref 19.0–186.0)
Retic Ct Pct: 1.9 % (ref 0.4–3.1)

## 2018-01-03 LAB — MAGNESIUM: Magnesium: 2 mg/dL (ref 1.7–2.4)

## 2018-01-03 MED ORDER — INFLUENZA VAC SPLIT HIGH-DOSE 0.5 ML IM SUSY
0.5000 mL | PREFILLED_SYRINGE | INTRAMUSCULAR | Status: AC
  Administered 2018-01-04: 0.5 mL via INTRAMUSCULAR
  Filled 2018-01-03: qty 0.5

## 2018-01-03 MED ORDER — POLYETHYLENE GLYCOL 3350 17 G PO PACK
17.0000 g | PACK | Freq: Every day | ORAL | Status: DC
  Administered 2018-01-03: 17 g via ORAL
  Filled 2018-01-03: qty 1

## 2018-01-03 MED ORDER — APIXABAN 5 MG PO TABS
5.0000 mg | ORAL_TABLET | Freq: Two times a day (BID) | ORAL | Status: DC
  Administered 2018-01-03 – 2018-01-04 (×3): 5 mg via ORAL
  Filled 2018-01-03 (×3): qty 1

## 2018-01-03 MED ORDER — AMIODARONE HCL 200 MG PO TABS
400.0000 mg | ORAL_TABLET | Freq: Two times a day (BID) | ORAL | Status: DC
  Administered 2018-01-03 – 2018-01-04 (×3): 400 mg via ORAL
  Filled 2018-01-03 (×3): qty 2

## 2018-01-03 NOTE — Progress Notes (Signed)
At rest patients heart rate is 70-90. When patient changes position ( sitting to lying ) or walk to bathroom heart rate increases to 170,pt asymptomatic. Etta Quill, RN

## 2018-01-03 NOTE — Progress Notes (Signed)
Physical Therapy Treatment Patient Details Name: Danielle Hebert MRN: [id number] DOB: 1934-10-26 Today's Date: 01/03/2018    History of Present Illness Pt is an 83 y/o female with PMH significant for RA and CKD, admitted after being found down on the floor confused and covered in feces and visible bruising on face.  Work up including acute metabolic encephalopathy of unknown etiology, afib and elevated troponin, suspected due to acute rhabdomyolysis.    PT Comments    Pt making steady progress with functional mobility. HR stable throughout this session with all activity (maintaining mid to high 90's). Pt participated in stair training this session with no difficulties. Pt would continue to benefit from skilled physical therapy services at this time while admitted and after d/c to address the below listed limitations in order to improve overall safety and independence with functional mobility.    Follow Up Recommendations  Home health PT;Supervision/Assistance - 24 hour     Equipment Recommendations  Rolling walker with 5" wheels    Recommendations for Other Services       Precautions / Restrictions Precautions Precautions: Fall Restrictions Weight Bearing Restrictions: No    Mobility  Bed Mobility Overal bed mobility: Needs Assistance Bed Mobility: Supine to Sit;Sit to Supine     Supine to sit: Supervision Sit to supine: Min guard   General bed mobility comments: increased time and effort  Transfers Overall transfer level: Needs assistance Equipment used: Rolling walker (2 wheeled) Transfers: Sit to/from Stand Sit to Stand: Supervision         General transfer comment: for safety  Ambulation/Gait Ambulation/Gait assistance: Min guard Gait Distance (Feet): 200 Feet Assistive device: Rolling walker (2 wheeled) Gait Pattern/deviations: Step-through pattern Gait velocity: decreased   General Gait Details: pt steady with RW, min guard for  safety   Stairs Stairs: Yes Stairs assistance: Min guard Stair Management: Two rails;Step to pattern;Forwards Number of Stairs: 1(one half step to simulate home set-up) General stair comments: min guard for safety   Wheelchair Mobility    Modified Rankin (Stroke Patients Only)       Balance Overall balance assessment: Needs assistance Sitting-balance support: No upper extremity supported Sitting balance-Leahy Scale: Good     Standing balance support: During functional activity;Single extremity supported;Bilateral upper extremity supported Standing balance-Leahy Scale: Poor                              Cognition Arousal/Alertness: Awake/alert Behavior During Therapy: WFL for tasks assessed/performed Overall Cognitive Status: Within Functional Limits for tasks assessed                                        Exercises      General Comments        Pertinent Vitals/Pain Pain Assessment: Faces Faces Pain Scale: Hurts little more Pain Location: hips Pain Descriptors / Indicators: Sore Pain Intervention(s): Monitored during session;Repositioned;Heat applied    Home Living Family/patient expects to be discharged to:: Private residence Living Arrangements: Alone                  Prior Function            PT Goals (current goals can now be found in the care plan section) Acute Rehab PT Goals PT Goal Formulation: With patient Time For Goal Achievement: 01/08/18 Potential to Achieve Goals: Good Progress towards  PT goals: Progressing toward goals    Frequency    Min 3X/week      PT Plan Current plan remains appropriate    Co-evaluation              AM-PAC PT "6 Clicks" Daily Activity  Outcome Measure  Difficulty turning over in bed (including adjusting bedclothes, sheets and blankets)?: None Difficulty moving from lying on back to sitting on the side of the bed? : None Difficulty sitting down on and standing  up from a chair with arms (e.g., wheelchair, bedside commode, etc,.)?: Unable Help needed moving to and from a bed to chair (including a wheelchair)?: A Little Help needed walking in hospital room?: A Little Help needed climbing 3-5 steps with a railing? : A Little 6 Click Score: 18    End of Session Equipment Utilized During Treatment: Gait belt Activity Tolerance: Patient tolerated treatment well Patient left: in bed;with call bell/phone within reach;with family/visitor present Nurse Communication: Mobility status PT Visit Diagnosis: Unsteadiness on feet (R26.81);Muscle weakness (generalized) (M62.81)     Time: 6568-1275 PT Time Calculation (min) (ACUTE ONLY): 19 min  Charges:  $Gait Training: 8-22 mins                     Sherie Don, [place], DPT  Acute Rehabilitation Services Pager [phone] Office [place] 01/03/2018, 3:31 PM

## 2018-01-03 NOTE — Progress Notes (Addendum)
PROGRESS NOTE    Danielle Hebert  SVX:[id number] DOB: Dec 28, 1934 DOA: 12/30/2017 PCP: Maurice Small, MD   Brief Narrative:  HPI On 12/30/2017 by Dr. Ivor Costa Raylyn Carton is a 83 y.o. female with medical history significant of hyperlipidemia, rheumatoid arthritis on Enbrel injection, CKD 3, who presents with altered mental status and fall.  Per her daughter, pt was last seen in normal health 2 days ago. Relative tried to call her yesterday and did not get a hold of her. They tried several times again today and became worried, so went to check on her. Pt was found on the ground at her house, confused and covered in feces.  She has bruises in her face and forehead.  Per daughter, patient normally is alert and oriented x3.  Currently patient is oriented to place, but not to the time and place.  Patient moves all extremities normally.  No facial droop, slurred speech.  She denies any pain anywhere. No acute respiratory distress, shortness breath, nausea, vomiting, diarrhea noted.  Interim history Patient admitted for acute encephalopathy along with fall.  Also found to have an elevated troponin of 0.22.  Work-up underway. Noted to have new Atrial fibrillation. Assessment & Plan   Acute metabolic encephalopathy with fall -Unknown etiology, possible medication vs Afib -CT head and C-spine negative for acute issues -Able to move all extremities without any focal deficits -Patient noted to have leukocytosis on admission, WBC of 20 however no fever. -Chest x-ray reviewed and unremarkable for infection, however trace pleural effusion -UA + trace leukocytes, however unremarkable for infection -hemoglobin A1c 5.8 -Blood cultures show no growth to date -PT and OT consulted, rec home health  Atrial fibrillation, paroxsymal  -new onset -?could patient have had an episode of AF with hypotension which led to her fall -Cardiology consulted and appreciated -no complaints of chest pain or  palpitations -CHADSVASC 3 based on age and gender -currently on heparin, cardiology recommending Eliquis -TSH 1.563 (WNL), K was replaced and up to 4.3 now -Off of cardizem drip and started on metoprolol 25mg  BID per cardiology -patient continues to be in AF, with HR in the 170s when changing position (such as going to the bathroom, etc) -pending further recommendations from cardiology   Elevated troponin -Denies chest pain -Troponin on admission 0.21, trending downward to 0.13 -Suspect secondary to acute rhabdomyolysis, CK level was 3293 on admission -Echocardiogram EF 23-30%, grade 2 diastolic dysfunction. No RWMA -BNP 257 on admission -Cardiology consulted and appreciated -Chest x-ray does show trace pleural effusion  Acute rhabdomyolysis -As above, CK level 3293 on admission and trended down to 592 -Continue gentle IV fluid hydration  Elevated LFTs -Patient has been on methotrexate for rheumatoid arthritis -Hepatitis panel negative -Statin held  Leukocytosis -possibly reactive, resolved -UA and CXR unremarkable for infection -WBC down from 20.3 to 8.9 without use of antibiotics -continue to monitor CBC  Chronic kidney disease, stage III -Creatinine appears to be at baseline and stable, currently 1.19 -continue to monitor BMP  Rheumatoid arthritis -Patient with notable ulnar deviation on exam -Patient takes weekly methotrexate injections as well as Enbrel injections  Hypokalemia -replaced, currently 4.3 -continue to monitor BMP  Normocytic anemia -hemoglobin down to 8.7 today, suspect dilutional component -no signs of bleeding  -IVF discontinued -currently on heparin drip -Anemia panel obtained: Adequate iron and stores -FOBT ordered -Continue to monitor CBC closely  DVT Prophylaxis Lovenox  Code Status: Full  Family Communication: None at bedside.   Disposition Plan: Admitted. Back in  AF and on cardizem drip. Pending further cardiology recommendations.  Suspect home with home health when stable.   Consultants Cardiology  Procedures  Echocardiogram  Antibiotics   Anti-infectives (From admission, onward)   None      Subjective:   [name] seen and examined today.  Has no complaints this morning. Denies current chest pain, shortness of breath, abdominal pain, N/V/D/C, dizziness, headache.  Objective:   Vitals:   01/02/18 1934 01/02/18 2315 01/03/18 0435 01/03/18 0724  BP: 113/62 123/62 127/64 128/65  Pulse: 91 80 76 88  Resp: 18 16 18 18   Temp: 99 F (37.2 C) 98.3 F (36.8 C) 98.8 F (37.1 C) 98.5 F (36.9 C)  TempSrc: Oral Oral Oral Oral  SpO2: 90% 93% 93% 92%  Weight:      Height:        Intake/Output Summary (Last 24 hours) at 01/03/2018 1040 Last data filed at 01/03/2018 0800 Gross per 24 hour  Intake 1285.51 ml  Output -  Net 1285.51 ml   Filed Weights   12/31/17 0119  Weight: 70 kg   Exam  General: Well developed, well nourished, NAD, appears stated age  26: [place], left chin bruising, mucous membranes moist.   Cardiovascular: S1 S2 auscultated, irregularly irregular, no murmur  Respiratory: Clear to auscultation bilaterally with equal chest rise  Abdomen: Soft, nontender, nondistended, + bowel sounds  Extremities: warm dry without cyanosis clubbing or edema  Neuro: AAOx3,nonfocal  Psych: Pleasant, appropriate mood and affect  Data Reviewed: I have personally reviewed following labs and imaging studies  CBC: Recent Labs  Lab 12/30/17 2124 12/30/17 2133 12/31/17 0012 01/01/18 0354 01/02/18 0337 01/03/18 0319  WBC 20.3*  --  19.9* 12.3* 10.8* 8.9  NEUTROABS 19.1*  --   --   --   --   --   HGB 13.4 15.0 12.4 10.3* 9.1* 8.7*  HCT 41.0 44.0 37.8 30.6* 28.5* 27.4*  MCV 93.4  --  94.3 93.9 95.3 96.8  PLT 284  --  263 198 230 381   Basic Metabolic Panel: Recent Labs  Lab 12/31/17 0012 12/31/17 1631 01/01/18 0354 01/02/18 0337 01/03/18 0319  NA 132* 136 133* 137 138  K  4.3 2.9* 4.4 4.6 4.3  CL 95* 97* 101 109 107  CO2 22 30 23 22 24   GLUCOSE 130* 212* 161* 120* 101*  BUN 27* 25* 31* 22 24*  CREATININE 1.26* 1.72* 1.48* 1.05* 1.19*  CALCIUM 9.5 8.9 8.6* 8.5* 8.4*  MG  --  1.8  --   --  2.0   GFR: Estimated Creatinine Clearance: 34.7 mL/min (A) (by C-G formula based on SCr of 1.19 mg/dL (H)). Liver Function Tests: Recent Labs  Lab 12/30/17 2124 01/01/18 0354 01/03/18 0319  AST 114* 55* 40  ALT 36 27 40  ALKPHOS 60 55 53  BILITOT 1.1 0.9 0.6  PROT 7.2 5.7* 5.4*  ALBUMIN 3.8 2.7* 2.3*   No results for input(s): LIPASE, AMYLASE in the last 168 hours. Recent Labs  Lab 12/31/17 0009  AMMONIA 14   Coagulation Profile: Recent Labs  Lab 12/30/17 2124  INR 1.14   Cardiac Enzymes: Recent Labs  Lab 12/30/17 2124 12/31/17 0008 12/31/17 0012 12/31/17 1153 01/01/18 0354  CKTOTAL 3,293*  --  2,999*  --  592*  TROPONINI  --  0.21*  --  0.13*  --    BNP (last 3 results) No results for input(s): PROBNP in the last 8760 hours. HbA1C: No results for input(s): HGBA1C  in the last 72 hours. CBG: No results for input(s): GLUCAP in the last 168 hours. Lipid Profile: No results for input(s): CHOL, HDL, LDLCALC, TRIG, CHOLHDL, LDLDIRECT in the last 72 hours. Thyroid Function Tests: Recent Labs    12/31/17 1631  TSH 1.563   Anemia Panel: Recent Labs    01/03/18 0719  VITAMINB12 268  FOLATE 9.7  TIBC 182*  IRON 57  RETICCTPCT 1.9   Urine analysis:    Component Value Date/Time   COLORURINE YELLOW 12/31/2017 0437   APPEARANCEUR HAZY (A) 12/31/2017 0437   LABSPEC 1.018 12/31/2017 0437   PHURINE 5.0 12/31/2017 0437   GLUCOSEU NEGATIVE 12/31/2017 0437   HGBUR SMALL (A) 12/31/2017 0437   BILIRUBINUR NEGATIVE 12/31/2017 0437   KETONESUR 5 (A) 12/31/2017 0437   PROTEINUR NEGATIVE 12/31/2017 0437   UROBILINOGEN 0.2 08/04/2008 1120   NITRITE NEGATIVE 12/31/2017 0437   LEUKOCYTESUR TRACE (A) 12/31/2017 0437   Sepsis  Labs: @LABRCNTIP (procalcitonin:4,lacticidven:4)  ) Recent Results (from the past 240 hour(s))  Culture, blood (Routine X 2) w Reflex to ID Panel     Status: None (Preliminary result)   Collection Time: 12/31/17 12:09 AM  Result Value Ref Range Status   Specimen Description BLOOD RIGHT ANTECUBITAL  Final   Special Requests   Final    BOTTLES DRAWN AEROBIC AND ANAEROBIC Blood Culture results may not be optimal due to an inadequate volume of blood received in culture bottles   Culture   Final    NO GROWTH 3 DAYS Performed at [facility], [place] [address], [place], [place] [address]    Report Status PENDING  Incomplete  Culture, blood (Routine X 2) w Reflex to ID Panel     Status: None (Preliminary result)   Collection Time: 12/31/17 12:17 AM  Result Value Ref Range Status   Specimen Description BLOOD LEFT HAND  Final   Special Requests   Final    BOTTLES DRAWN AEROBIC AND ANAEROBIC Blood Culture results may not be optimal due to an inadequate volume of blood received in culture bottles   Culture   Final    NO GROWTH 3 DAYS Performed at [facility], [place] [address] [place], [place] [address]    Report Status PENDING  Incomplete      Radiology Studies: No results found.   Scheduled Meds: . aspirin EC  325 mg Oral Daily  . cholecalciferol  1,000 Units Oral Daily  . metoprolol tartrate  25 mg Oral BID  . omega-3 acid ethyl esters  1 g Oral Daily   Continuous Infusions: . heparin 1,050 Units/hr (01/03/18 0338)  . sodium chloride       LOS: 4 days   Time Spent in minutes   45 minutes (greater than 50% of time spent with patient face to face, discussing with patient's daughter, as well as reviewing records, calling consults, and formulating a plan)  Cristal Ford D.O. on 01/03/2018 at 10:40 AM  Between 7am to 7pm - Please see pager noted on [email or web address]  After 7pm go to [email or web address]  And look for the night coverage person covering for me after hours  [facility] Office  [phone]

## 2018-01-03 NOTE — Progress Notes (Signed)
Patient ID: Danielle Hebert, female   DOB: December 07, 1934, 83 y.o.   MRN: [id number]     Advanced Heart Failure Rounding Note  PCP-Cardiologist: Quay Burow, MD   Subjective:    She remains in and out of atrial fibrillation.  Was in atrial fibrillation with RVR earlier today but now back in NSR in 70s.    Echo: EF 55-60% with normal RV size and systolic function.   Objective:   Weight Range: 70 kg Body mass index is 24.91 kg/m.   Vital Signs:   Temp:  [98.3 F (36.8 C)-99 F (37.2 C)] 98.5 F (36.9 C) (10/19 0724) Pulse Rate:  [76-91] 88 (10/19 0724) Resp:  [16-18] 18 (10/19 0724) BP: (113-128)/(62-66) 128/65 (10/19 0724) SpO2:  [90 %-96 %] 92 % (10/19 0724) Last BM Date: 01/02/18  Weight change: Filed Weights   12/31/17 0119  Weight: 70 kg    Intake/Output:   Intake/Output Summary (Last 24 hours) at 01/03/2018 1154 Last data filed at 01/03/2018 0800 Gross per 24 hour  Intake 1285.51 ml  Output -  Net 1285.51 ml      Physical Exam    General:  Well appearing. No resp difficulty HEENT: Normal Neck: Supple. JVP . Carotids 2+ bilat; no bruits. No lymphadenopathy or thyromegaly appreciated. Cor: PMI nondisplaced. Regular rate & rhythm. No rubs, gallops or murmurs. Lungs: Clear Abdomen: Soft, nontender, nondistended. No hepatosplenomegaly. No bruits or masses. Good bowel sounds. Extremities: No cyanosis, clubbing, rash, edema Neuro: Alert & orientedx3, cranial nerves grossly intact. moves all 4 extremities w/o difficulty. Affect pleasant   Telemetry   Currently NSR in 70s but has been in and out of atrial fibrillation (personally reviewed).   Labs    CBC Recent Labs    01/02/18 0337 01/03/18 0319  WBC 10.8* 8.9  HGB 9.1* 8.7*  HCT 28.5* 27.4*  MCV 95.3 96.8  PLT 230 812   Basic Metabolic Panel Recent Labs    12/31/17 1631  01/02/18 0337 01/03/18 0319  NA 136   < > 137 138  K 2.9*   < > 4.6 4.3  CL 97*   < > 109 107  CO2 30   < > 22 24    GLUCOSE 212*   < > 120* 101*  BUN 25*   < > 22 24*  CREATININE 1.72*   < > 1.05* 1.19*  CALCIUM 8.9   < > 8.5* 8.4*  MG 1.8  --   --  2.0   < > = values in this interval not displayed.   Liver Function Tests Recent Labs    01/01/18 0354 01/03/18 0319  AST 55* 40  ALT 27 40  ALKPHOS 55 53  BILITOT 0.9 0.6  PROT 5.7* 5.4*  ALBUMIN 2.7* 2.3*   No results for input(s): LIPASE, AMYLASE in the last 72 hours. Cardiac Enzymes Recent Labs    01/01/18 0354  CKTOTAL 592*    BNP: BNP (last 3 results) Recent Labs    12/31/17 0008  BNP 257.0*    ProBNP (last 3 results) No results for input(s): PROBNP in the last 8760 hours.   D-Dimer No results for input(s): DDIMER in the last 72 hours. Hemoglobin A1C No results for input(s): HGBA1C in the last 72 hours. Fasting Lipid Panel No results for input(s): CHOL, HDL, LDLCALC, TRIG, CHOLHDL, LDLDIRECT in the last 72 hours. Thyroid Function Tests Recent Labs    12/31/17 1631  TSH 1.563    Other results:   Imaging  No results found.   Medications:     Scheduled Medications: . cholecalciferol  1,000 Units Oral Daily  . metoprolol tartrate  25 mg Oral BID  . omega-3 acid ethyl esters  1 g Oral Daily     Infusions: . heparin 1,050 Units/hr (01/03/18 0338)  . sodium chloride       PRN Medications:  acetaminophen, cyclobenzaprine, ondansetron **OR** ondansetron (ZOFRAN) IV, senna-docusate, zolpidem   Assessment/Plan   1. Elevated troponin: Suspect demand ischemia with fall, atrial fibrillation with RVR.  2. Atrial fibrillation: Paroxysmal.  She continues to be in and out of atrial fibrillation with RVR.  She is mildly lightheaded when she walks.  It is possible that her fall occurred in the setting of atrial fibrillation with RVR.  Currently in NSR but earlier in afib.  - Atrial fibrillation is rapid and symptomatic.  She will need an agent to keep her in NSR.  I will use amiodarone, start 400 mg bid and  decrease dose over time to 200 mg daily or even 100 mg daily.  Can stop metoprolol for now to limit lightheadedness.  - I think that her fall may have been in the setting of afib/RVR with hypotension.  Will stop heparin gtt and start Eliquis 5 mg bid.  Think benefits outweigh risks (walked with PT in hall today, was stable).   Length of Stay: 4  Loralie Champagne, MD  01/03/2018, 11:54 AM  Advanced Heart Failure Team Pager [phone] (M-F; 7a - 4p)  Please contact [facility] for night-coverage after hours (4p -7a ) and weekends on [email or web address]

## 2018-01-03 NOTE — Progress Notes (Addendum)
ANTICOAGULATION CONSULT NOTE - Follow Up Consult  Pharmacy Consult for heparin Indication: atrial fibrillation  Allergies  Allergen Reactions  . Ciprofloxacin   . Remicade [Infliximab]     Patient Measurements: Height: 5\' 6"  (167.6 cm) Weight: 154 lb 5.2 oz (70 kg) IBW/kg (Calculated) : 59.3 Heparin Dosing Weight: 70kg  Vital Signs: Temp: 98.5 F (36.9 C) (10/19 0724) Temp Source: Oral (10/19 0724) BP: 128/65 (10/19 0724) Pulse Rate: 88 (10/19 0724)  Labs: Recent Labs    12/31/17 1153  01/01/18 0354 01/01/18 0959 01/02/18 0337 01/03/18 0319  HGB  --    < > 10.3*  --  9.1* 8.7*  HCT  --   --  30.6*  --  28.5* 27.4*  PLT  --   --  198  --  230 245  HEPARINUNFRC  --    < > 0.39 0.44 0.35 0.29*  CREATININE  --    < > 1.48*  --  1.05* 1.19*  CKTOTAL  --   --  592*  --   --   --   TROPONINI 0.13*  --   --   --   --   --    < > = values in this interval not displayed.    Estimated Creatinine Clearance: 34.7 mL/min (A) (by C-G formula based on SCr of 1.19 mg/dL (H)).  Assessment: Pt has new onset AFib, started heparin 1050 units/hr 10/17. Pt's heparin level is therapeutic at current rate. Per cardiology NP, possible transition to Eliquis. No anticoagulation PTA.   10/19: Heparin level this morning subtherapeutic at 0.29 on 1050 units/hr. Hemoglobin low/stable at 8.7, plt 245. No infusion issues per nursing. Has noticed some blood on toilet paper after BM, otherwise no bleeding noted, will continue to monitor.  Goal of Therapy:  Heparin level 0.3-0.7 units/ml Monitor platelets by anticoagulation protocol: Yes    Plan:  Increase heparin to 1150 units/hr Check heparin level @ 1900 today Daily heparin level, CBC, s/s bleeding F/U transition to apixaban per cardiology  Thank you for involving pharmacy in this patient's care.  Janae Bridgeman, PharmD PGY1 Pharmacy Resident Phone: [phone] 01/03/2018 11:04 AM   ____________________________ ADDENDUM: Per Dr.  Aundra Dubin, will discontinue heparin gtt and start apixaban 5mg  BID.  Janae Bridgeman, PharmD PGY1 Pharmacy Resident Phone: [phone] 01/03/2018 12:22 PM

## 2018-01-04 LAB — CBC
HCT: 27.7 % — ABNORMAL LOW (ref 36.0–46.0)
Hemoglobin: 9.2 g/dL — ABNORMAL LOW (ref 12.0–15.0)
MCH: 31.7 pg (ref 26.0–34.0)
MCHC: 33.2 g/dL (ref 30.0–36.0)
MCV: 95.5 fL (ref 80.0–100.0)
NRBC: 0.3 % — AB (ref 0.0–0.2)
PLATELETS: 267 10*3/uL (ref 150–400)
RBC: 2.9 MIL/uL — ABNORMAL LOW (ref 3.87–5.11)
RDW: 15 % (ref 11.5–15.5)
WBC: 7.7 10*3/uL (ref 4.0–10.5)

## 2018-01-04 LAB — COMPREHENSIVE METABOLIC PANEL
ALBUMIN: 2.5 g/dL — AB (ref 3.5–5.0)
ALT: 38 U/L (ref 0–44)
AST: 30 U/L (ref 15–41)
Alkaline Phosphatase: 49 U/L (ref 38–126)
Anion gap: 8 (ref 5–15)
BILIRUBIN TOTAL: 0.6 mg/dL (ref 0.3–1.2)
BUN: 14 mg/dL (ref 8–23)
CALCIUM: 8.7 mg/dL — AB (ref 8.9–10.3)
CHLORIDE: 106 mmol/L (ref 98–111)
CO2: 25 mmol/L (ref 22–32)
Creatinine, Ser: 0.91 mg/dL (ref 0.44–1.00)
GFR calc Af Amer: >60 mL/min (ref >60)
GFR calc non Af Amer: 58 mL/min — ABNORMAL LOW (ref >60)
GLUCOSE: 108 mg/dL — AB (ref 70–99)
POTASSIUM: 4.1 mmol/L (ref 3.5–5.1)
SODIUM: 139 mmol/L (ref 135–145)
Total Protein: 5.5 g/dL — ABNORMAL LOW (ref 6.5–8.1)

## 2018-01-04 LAB — CK: CK TOTAL: 76 U/L (ref 38–234)

## 2018-01-04 MED ORDER — APIXABAN 5 MG PO TABS: 5.0000 mg | ORAL_TABLET | Freq: Two times a day (BID) | ORAL | 0 refills | Status: DC

## 2018-01-04 MED ORDER — AMIODARONE HCL 200 MG PO TABS: ORAL_TABLET | ORAL | 0 refills | Status: DC

## 2018-01-04 NOTE — Discharge Instructions (Signed)
Atrial Fibrillation Atrial fibrillation is a type of heartbeat that is irregular or fast (rapid). If you have this condition, your heart keeps quivering in a weird (chaotic) way. This condition can make it so your heart cannot pump blood normally. Having this condition gives a person more risk for stroke, heart failure, and other heart problems. There are different types of atrial fibrillation. Talk with your doctor to learn about the type that you have. Follow these instructions at home:  Take over-the-counter and prescription medicines only as told by your doctor.  If your doctor prescribed a blood-thinning medicine, take it exactly as told. Taking too much of it can cause bleeding. If you do not take enough of it, you will not have the protection that you need against stroke and other problems.  Do not use any tobacco products. These include cigarettes, chewing tobacco, and e-cigarettes. If you need help quitting, ask your doctor.  If you have apnea (obstructive sleep apnea), manage it as told by your doctor.  Do not drink alcohol.  Do not drink beverages that have caffeine. These include coffee, soda, and tea.  Maintain a healthy weight. Do not use diet pills unless your doctor says they are safe for you. Diet pills may make heart problems worse.  Follow diet instructions as told by your doctor.  Exercise regularly as told by your doctor.  Keep all follow-up visits as told by your doctor. This is important. Contact a doctor if:  You notice a change in the speed, rhythm, or strength of your heartbeat.  You are taking a blood-thinning medicine and you notice more bruising.  You get tired more easily when you move or exercise. Get help right away if:  You have pain in your chest or your belly (abdomen).  You have sweating or weakness.  You feel sick to your stomach (nauseous).  You notice blood in your throw up (vomit), poop (stool), or pee (urine).  You are short of  breath.  You suddenly have swollen feet and ankles.  You feel dizzy.  Your suddenly get weak or numb in your face, arms, or legs, especially if it happens on one side of your body.  You have trouble talking, trouble understanding, or both.  Your face or your eyelid droops on one side. These symptoms may be an emergency. Do not wait to see if the symptoms will go away. Get medical help right away. Call your local emergency services (911 in the [place] Do not drive yourself to the hospital. This information is not intended to replace advice given to you by your health care provider. Make sure you discuss any questions you have with your health care provider. Document Released: 12/12/2007 Document Revised: 08/10/2015 Document Reviewed: 06/29/2014 Elsevier Interactive Patient Education  2018 [place] in the Home Falls can cause injuries. They can happen to people of all ages. There are many things you can do to make your home safe and to help prevent falls. What can I do on the outside of my home?  Regularly fix the edges of walkways and driveways and fix any cracks.  Remove anything that might make you trip as you walk through a door, such as a raised step or threshold.  Trim any bushes or trees on the path to your home.  Use bright outdoor lighting.  Clear any walking paths of anything that might make someone trip, such as rocks or tools.  Regularly check to see if handrails are  loose or broken. Make sure that both sides of any steps have handrails.  Any raised decks and porches should have guardrails on the edges.  Have any leaves, snow, or ice cleared regularly.  Use sand or salt on walking paths during winter.  Clean up any spills in your garage right away. This includes oil or grease spills. What can I do in the bathroom?  Use night lights.  Install grab bars by the toilet and in the tub and shower. Do not use towel bars as grab bars.  Use  non-skid mats or decals in the tub or shower.  If you need to sit down in the shower, use a plastic, non-slip stool.  Keep the floor dry. Clean up any water that spills on the floor as soon as it happens.  Remove soap buildup in the tub or shower regularly.  Attach bath mats securely with double-sided non-slip rug tape.  Do not have throw rugs and other things on the floor that can make you trip. What can I do in the bedroom?  Use night lights.  Make sure that you have a light by your bed that is easy to reach.  Do not use any sheets or blankets that are too big for your bed. They should not hang down onto the floor.  Have a firm chair that has side arms. You can use this for support while you get dressed.  Do not have throw rugs and other things on the floor that can make you trip. What can I do in the kitchen?  Clean up any spills right away.  Avoid walking on wet floors.  Keep items that you use a lot in easy-to-reach places.  If you need to reach something above you, use a strong step stool that has a grab bar.  Keep electrical cords out of the way.  Do not use floor polish or wax that makes floors slippery. If you must use wax, use non-skid floor wax.  Do not have throw rugs and other things on the floor that can make you trip. What can I do with my stairs?  Do not leave any items on the stairs.  Make sure that there are handrails on both sides of the stairs and use them. Fix handrails that are broken or loose. Make sure that handrails are as long as the stairways.  Check any carpeting to make sure that it is firmly attached to the stairs. Fix any carpet that is loose or worn.  Avoid having throw rugs at the top or bottom of the stairs. If you do have throw rugs, attach them to the floor with carpet tape.  Make sure that you have a light switch at the top of the stairs and the bottom of the stairs. If you do not have them, ask someone to add them for you. What  else can I do to help prevent falls?  Wear shoes that: ? Do not have high heels. ? Have rubber bottoms. ? Are comfortable and fit you well. ? Are closed at the toe. Do not wear sandals.  If you use a stepladder: ? Make sure that it is fully opened. Do not climb a closed stepladder. ? Make sure that both sides of the stepladder are locked into place. ? Ask someone to hold it for you, if possible.  Clearly mark and make sure that you can see: ? Any grab bars or handrails. ? First and last steps. ? Where the edge of  each step is.  Use tools that help you move around (mobility aids) if they are needed. These include: ? Canes. ? Walkers. ? Scooters. ? Crutches.  Turn on the lights when you go into a dark area. Replace any light bulbs as soon as they burn out.  Set up your furniture so you have a clear path. Avoid moving your furniture around.  If any of your floors are uneven, fix them.  If there are any pets around you, be aware of where they are.  Review your medicines with your doctor. Some medicines can make you feel dizzy. This can increase your chance of falling. Ask your doctor what other things that you can do to help prevent falls. This information is not intended to replace advice given to you by your health care provider. Make sure you discuss any questions you have with your health care provider. Document Released: 12/29/2008 Document Revised: 08/10/2015 Document Reviewed: 04/08/2014 Elsevier Interactive Patient Education  [employer].

## 2018-01-04 NOTE — Care Management Note (Signed)
Case Management Note  Patient Details  Name: Danielle Hebert MRN: [id number] Date of Birth: 1934/10/09  Subjective/Objective:         Pt from home alone with children to assist.  Pt has RW at home but would like a 3n1.  Pt has never used [facility] services.             Action/Plan: [place] agency list offered for choice.  Pt and son choose [facility].  Referral called to [facility] with [facility].  Order placed for 3n1 with [facility] and will be delivered to room prior to dc.   Expected Discharge Date:  01/04/18               Expected Discharge Plan:  Early  In-House Referral:  NA  Discharge planning Services  CM Consult  Post Acute Care Choice:  Durable Medical Equipment, Home Health Choice offered to:  Patient, Adult Children  DME Arranged:  3-N-1 DME Agency:  [place] Arranged:  PT, OT [place] Agency:  [place]  Status of Service:  Completed, signed off  If discussed at [place] of Stay Meetings, dates discussed:    Additional Comments:  Claudie Leach, RN 01/04/2018, 1:11 PM

## 2018-01-04 NOTE — Plan of Care (Signed)
READY FOR DISCHARGE

## 2018-01-04 NOTE — Progress Notes (Signed)
Patient ID: Danielle Hebert, female   DOB: 12/27/1934, 83 y.o.   MRN: [id number]      Advanced Heart Failure Rounding Note  PCP-Cardiologist: Quay Burow, MD   Subjective:    She is staying in NSR on amiodarone.  SBP mainly in 130s.  Walked in hall without lightheadedness.   Echo: EF 55-60% with normal RV size and systolic function.   Objective:   Weight Range: 70 kg Body mass index is 24.91 kg/m.   Vital Signs:   Temp:  [98 F (36.7 C)-99 F (37.2 C)] 98.4 F (36.9 C) (10/20 1114) Pulse Rate:  [45-84] 84 (10/20 1114) Resp:  [17-18] 18 (10/20 1114) BP: (129-151)/(67-88) 151/79 (10/20 1114) SpO2:  [92 %-98 %] 98 % (10/20 1114) Last BM Date: 01/02/18  Weight change: Filed Weights   12/31/17 0119  Weight: 70 kg    Intake/Output:   Intake/Output Summary (Last 24 hours) at 01/04/2018 1115 Last data filed at 01/03/2018 1300 Gross per 24 hour  Intake 455.11 ml  Output -  Net 455.11 ml      Physical Exam    General: NAD Neck: No JVD, no thyromegaly or thyroid nodule.  Lungs: Clear to auscultation bilaterally with normal respiratory effort. CV: Nondisplaced PMI.  Heart regular S1/S2, no S3/S4, no murmur.  No peripheral edema.   Abdomen: Soft, nontender, no hepatosplenomegaly, no distention.  Skin: Intact without lesions or rashes.  Neurologic: Alert and oriented x 3.  Psych: Normal affect. Extremities: No clubbing or cyanosis.  HEENT: Normal. Ecchymosis at chin from fall.    Telemetry   NSR in 70s, no further atrial fibrillation (personally reviewed).   Labs    CBC Recent Labs    01/03/18 0319 01/04/18 0450  WBC 8.9 7.7  HGB 8.7* 9.2*  HCT 27.4* 27.7*  MCV 96.8 95.5  PLT 245 956   Basic Metabolic Panel Recent Labs    01/03/18 0319 01/04/18 0450  NA 138 139  K 4.3 4.1  CL 107 106  CO2 24 25  GLUCOSE 101* 108*  BUN 24* 14  CREATININE 1.19* 0.91  CALCIUM 8.4* 8.7*  MG 2.0  --    Liver Function Tests Recent Labs    01/03/18 0319  01/04/18 0450  AST 40 30  ALT 40 38  ALKPHOS 53 49  BILITOT 0.6 0.6  PROT 5.4* 5.5*  ALBUMIN 2.3* 2.5*   No results for input(s): LIPASE, AMYLASE in the last 72 hours. Cardiac Enzymes Recent Labs    01/04/18 0450  CKTOTAL 76    BNP: BNP (last 3 results) Recent Labs    12/31/17 0008  BNP 257.0*    ProBNP (last 3 results) No results for input(s): PROBNP in the last 8760 hours.   D-Dimer No results for input(s): DDIMER in the last 72 hours. Hemoglobin A1C No results for input(s): HGBA1C in the last 72 hours. Fasting Lipid Panel No results for input(s): CHOL, HDL, LDLCALC, TRIG, CHOLHDL, LDLDIRECT in the last 72 hours. Thyroid Function Tests No results for input(s): TSH, T4TOTAL, T3FREE, THYROIDAB in the last 72 hours.  Invalid input(s): FREET3  Other results:   Imaging    No results found.   Medications:     Scheduled Medications: . amiodarone  400 mg Oral BID  . apixaban  5 mg Oral BID  . cholecalciferol  1,000 Units Oral Daily  . omega-3 acid ethyl esters  1 g Oral Daily  . polyethylene glycol  17 g Oral Daily    Infusions: .  sodium chloride      PRN Medications: acetaminophen, cyclobenzaprine, ondansetron **OR** ondansetron (ZOFRAN) IV, senna-docusate, zolpidem   Assessment/Plan   1. Elevated troponin: Suspect demand ischemia with fall, atrial fibrillation with RVR.  2. Atrial fibrillation: Paroxysmal.  It is possible that her fall occurred in the setting of atrial fibrillation with RVR.  She is now in NSR without further atrial fibrillation/RVR since amiodarone begun.  - Continue amiodarone 400 mg bid to maintain NSR (very symptomatic with rapid rate when she goes into atrial fibrillation).  She is off metoprolol for now with prior orthostatic symptoms (resolved).  - I think that her fall may have been in the setting of afib/RVR with hypotension.  She is now on Eliquis 5 mg bid.  Think benefits outweigh risks (has been stable walking in hall).    From my standpoint, she could go home today.  Followup with [facility] cardiology will be arranged.  Cardiac meds for home: Eliquis 5 mg bid, amiodarone 400 mg bid x 1 week then 200 mg bid x 1 week then 200 mg daily after that.   Length of Stay: 5  Loralie Champagne, MD  01/04/2018, 11:15 AM  Advanced Heart Failure Team Pager [phone] (M-F; 7a - 4p)  Please contact [facility] for night-coverage after hours (4p -7a ) and weekends on [email or web address]

## 2018-01-04 NOTE — Discharge Summary (Signed)
Physician Discharge Summary  Danielle Hebert RJJ:[id number] DOB: 05/18/1934 DOA: 12/30/2017  PCP: Danielle Small, MD  Admit date: 12/30/2017 Discharge date: 01/04/2018  Time spent: 45 minutes  Recommendations for Outpatient Follow-up:  Patient will be discharged to home with home health physical and occupational therapy.  Patient will need to follow up with primary care provider within one week of discharge.  Follow up with cardiology. Patient should continue medications as prescribed.  Patient should follow a regular diet.   Discharge Diagnoses:  Acute metabolic encephalopathy with fall Atrial fibrillation, paroxsymal  Elevated troponin Acute rhabdomyolysis Elevated LFTs Leukocytosis Chronic kidney disease, stage III Rheumatoid arthritis Hypokalemia Normocytic anemia  Discharge Condition: Stable  Diet recommendation: Regular  Filed Weights   12/31/17 0119  Weight: 70 kg    History of present illness:  On 12/30/2017 by Dr. Fredricka Bonine Richmondis a 83 y.o.femalewith medical history significant ofhyperlipidemia, rheumatoid arthritis on Enbrel injection, CKD 3, who presents with altered mental status and fall.  Per her daughter,pt waslast seeninnormal health2days ago.Relative tried to call her yesterday and did not get a hold of her. Theytried several times again today and became worried,so went to check on her. Pt was foundon the ground at her house, confused and covered in feces. She has bruisesin her face and forehead. Perdaughter, patient normally is alertandoriented x3. Currently patient is oriented to place, but not to the time and place. Patient moves all extremities normally. No facial droop, slurred speech. She denies any pain anywhere.No acute respiratory distress, shortness breath, nausea, vomiting, diarrhea noted.  Hospital Course:  Acute metabolic encephalopathy with fall -Unknown etiology, possible medication vs Afib -CT head  and C-spine negative for acute issues -Able to move all extremities without any focal deficits -Patient noted to have leukocytosis on admission, WBC of 20 however no fever. -Chest x-ray reviewed and unremarkable for infection, however trace pleural effusion -UA + trace leukocytes, however unremarkable for infection -hemoglobin A1c 5.8 -Blood cultures show no growth to date -PT and OT consulted, rec home health -discussed the cautious use of xanax and tizanidine with patient and family  Atrial fibrillation, paroxsymal  -new onset -?could patient have had an episode of AF with hypotension which led to her fall -Cardiology consulted and appreciated -no complaints of chest pain or palpitations -CHADSVASC 3 based on age and gender -initially placed on heparin, transitioned to Eliquis (discussed risks and benefits with patient and family) -TSH 1.563 (WNL), K was replaced and up to 4.1 now -Off of cardizem drip -patient was having continued AF, with HR in the 170s when changing position (such as going to the bathroom, etc)- and was placed on amiodarone per cardiology -Rec for amiodarone: 400mg  BID for 1 week, followed by 200mg  BID x 1 week, then 200mg  daily -Currently back in sinus rhythm   Elevated troponin -Denies chest pain -Troponin on admission 0.21, trending downward to 0.13 -Suspect secondary to acute rhabdomyolysis, CK level was 3293 on admission -Echocardiogram EF 01-60%, grade 2 diastolic dysfunction. No RWMA -BNP 257 on admission -Cardiology consulted and appreciated -Chest x-ray does show trace pleural effusion  Acute rhabdomyolysis -As above, CK level 3293 on admission and trended down to 72 -was given gentle IV fluid hydration  Elevated LFTs -Patient has been on methotrexate for rheumatoid arthritis -Hepatitis panel negative -Statin held  Leukocytosis -possibly reactive, resolved -UA and CXR unremarkable for infection -WBC down from 20.3 to 7.7 without use of  antibiotics -continue to monitor CBC  Chronic kidney disease, stage III -Creatinine  appears to be at baseline and stable, currently 0.91 -continue to monitor BMP  Rheumatoid arthritis -Patient with notable ulnar deviation on exam -Patient takes weekly methotrexate injections as well as Enbrel injections  Hypokalemia -replaced, currently 4.1  Normocytic anemia -hemoglobin down to 9.2 -suspect slight drop due to dilutional component -no signs of bleeding  -IVF discontinued -Anemia panel obtained: Adequate iron and stores -repeat CBC in one week  Code status: Full  Procedures:  Echocardiogram   Consultations:  Cardiology   Discharge Exam: Vitals:   01/04/18 0823 01/04/18 1114  BP: 132/88 (!) 151/79  Pulse: (!) 45 84  Resp: 18 18  Temp: 98.9 F (37.2 C) 98.4 F (36.9 C)  SpO2: 98% 98%   Patient feeling much better today. Denies current chest pain, shortness of breath, palpitations, abdominal pain, N/V/D/C. Denies dizziness or headache.    General: Well developed, well nourished, NAD, appears stated age  59: [place]. Left chin bruising mucous membranes moist.  Neck: Supple  Cardiovascular: S1 S2 auscultated, RRR  Respiratory: Clear to auscultation bilaterally with equal chest rise  Abdomen: Soft, nontender, nondistended, + bowel sounds  Extremities: warm dry without cyanosis clubbing or edema  Neuro: AAOx3, nonfocal  Psych: Normal affect and demeanor, pleasant   Discharge Instructions Discharge Instructions    Discharge instructions   Complete by:  As directed    Patient will be discharged to home with home health physical and occupational therapy.  Patient will need to follow up with primary care provider within one week of discharge.  Follow up with cardiology. Patient should continue medications as prescribed.  Patient should follow a regular diet.     Allergies as of 01/04/2018      Reactions   Ciprofloxacin    Remicade [infliximab]         Medication List    TAKE these medications   ALPRAZolam 0.5 MG tablet Commonly known as:  XANAX Take 0.5 mg by mouth 2 (two) times daily.   amiodarone 200 MG tablet Commonly known as:  PACERONE Take 2 tablets twice daily for 7 days. Starting 10/27, take 1 tablet twice daily. Starting 11/32019, take one tablet daily.   apixaban 5 MG Tabs tablet Commonly known as:  ELIQUIS Take 1 tablet (5 mg total) by mouth 2 (two) times daily.   cetirizine 10 MG tablet Commonly known as:  ZYRTEC Take 10 mg by mouth daily as needed.   cholecalciferol 1000 units tablet Commonly known as:  VITAMIN D Take 1,000 Units by mouth daily.   ENBREL 25 MG injection Generic drug:  etanercept Inject 25 mg into the skin.   Fish Oil 1200 MG Caps Take by mouth.   fluticasone 50 MCG/ACT nasal spray Commonly known as:  FLONASE Place 1 spray into both nostrils daily.   folic acid 1 MG tablet Commonly known as:  FOLVITE Take 1 mg by mouth daily.   methotrexate 2.5 MG tablet Take 12.5 mg by mouth once a week.   metroNIDAZOLE 0.75 % cream Commonly known as:  METROCREAM Apply 1 application topically 2 (two) times daily. Apply cream to affected area   omeprazole 40 MG capsule Commonly known as:  PRILOSEC Take 40 mg by mouth daily.   RESTASIS MULTIDOSE 0.05 % ophthalmic emulsion Generic drug:  cycloSPORINE Place 1 drop into both eyes 2 (two) times daily.   rosuvastatin 20 MG tablet Commonly known as:  CRESTOR Take 10 mg by mouth daily.   tiZANidine 4 MG tablet Commonly known as:  ZANAFLEX  Take 4 mg by mouth 2 (two) times daily as needed for muscle spasms.      Allergies  Allergen Reactions  . Ciprofloxacin   . Remicade [Infliximab]    Follow-up Information    Danielle Small, MD. Schedule an appointment as soon as possible for a visit in 1 week(s).   Specialty:  Family Medicine Why:  Hospital follow up Contact information: [place]  [phone]        Lorretta Harp, MD Follow up.   Specialties:  Cardiology, Radiology Why:  The office will call you to arrange follow up.  Contact information: [address] [place] [address] [phone]            The results of significant diagnostics from this hospitalization (including imaging, microbiology, ancillary and laboratory) are listed below for reference.    Significant Diagnostic Studies: Dg Chest 2 View  Result Date: 12/30/2017 CLINICAL DATA:  Found down, altered EXAM: CHEST - 2 VIEW COMPARISON:  08/04/2008 FINDINGS: Trace pleural effusions. Cardiomegaly with mild diffuse interstitial opacity suggesting minimal edema. Minimal atelectasis left base. No pneumothorax. IMPRESSION: 1. Trace pleural effusions. 2. Cardiomegaly with mild diffuse interstitial opacity suggesting mild interstitial edema Electronically Signed   By: Donavan Foil M.D.   On: 12/30/2017 22:01   Ct Head Wo Contrast  Result Date: 12/30/2017 CLINICAL DATA:  Unwitnessed fall. Altered mental status with bruising to the arms, elbows, and face. EXAM: CT HEAD WITHOUT CONTRAST CT CERVICAL SPINE WITHOUT CONTRAST TECHNIQUE: Multidetector CT imaging of the head and cervical spine was performed following the standard protocol without intravenous contrast. Multiplanar CT image reconstructions of the cervical spine were also generated. COMPARISON:  None. FINDINGS: CT HEAD FINDINGS Brain: Mild diffuse cerebral atrophy. Mild ventricular dilatation consistent with central atrophy. Low-attenuation changes in the deep white matter consistent with Hebert vessel ischemia. No mass effect or midline shift. No abnormal extra-axial fluid collections. Gray-white matter junctions are distinct. Basal cisterns are not effaced. No acute intracranial hemorrhage. Vascular: Mild intracranial arterial vascular calcifications. Skull: Calvarium appears intact. No acute depressed skull fractures.  Sinuses/Orbits: Mucosal thickening in the paranasal sinuses. No acute air-fluid levels. Mastoid air cells are clear. Other: None. CT CERVICAL SPINE FINDINGS Alignment: Normal alignment of the cervical vertebrae and facet joints. C1-2 articulation appears intact. Skull base and vertebrae: Skull base appears intact. No vertebral compression deformities. No focal bone lesion or bone destruction. Bone cortex appears intact. Soft tissues and spinal canal: No prevertebral soft tissue swelling. No abnormal soft tissue paraspinal mass or infiltration. Disc levels: Degenerative changes in the cervical spine with narrowed disc spaces and associated endplate hypertrophic changes. Degenerative changes in the facet joints and at C1-2. Upper chest: Motion artifact limits examination. Possible patchy airspace infiltrates in the right apex. Edema versus pneumonia. Other: None. IMPRESSION: 1. No acute intracranial abnormalities. Chronic atrophy and Hebert vessel ischemic changes. 2. Normal alignment of the cervical spine. Degenerative changes. No acute displaced fractures identified. Electronically Signed   By: Lucienne Capers M.D.   On: 12/30/2017 21:56   Ct Cervical Spine Wo Contrast  Result Date: 12/30/2017 CLINICAL DATA:  Unwitnessed fall. Altered mental status with bruising to the arms, elbows, and face. EXAM: CT HEAD WITHOUT CONTRAST CT CERVICAL SPINE WITHOUT CONTRAST TECHNIQUE: Multidetector CT imaging of the head and cervical spine was performed following the standard protocol without intravenous contrast. Multiplanar CT image reconstructions of the cervical spine were also generated. COMPARISON:  None. FINDINGS:  CT HEAD FINDINGS Brain: Mild diffuse cerebral atrophy. Mild ventricular dilatation consistent with central atrophy. Low-attenuation changes in the deep white matter consistent with Hebert vessel ischemia. No mass effect or midline shift. No abnormal extra-axial fluid collections. Gray-white matter junctions are  distinct. Basal cisterns are not effaced. No acute intracranial hemorrhage. Vascular: Mild intracranial arterial vascular calcifications. Skull: Calvarium appears intact. No acute depressed skull fractures. Sinuses/Orbits: Mucosal thickening in the paranasal sinuses. No acute air-fluid levels. Mastoid air cells are clear. Other: None. CT CERVICAL SPINE FINDINGS Alignment: Normal alignment of the cervical vertebrae and facet joints. C1-2 articulation appears intact. Skull base and vertebrae: Skull base appears intact. No vertebral compression deformities. No focal bone lesion or bone destruction. Bone cortex appears intact. Soft tissues and spinal canal: No prevertebral soft tissue swelling. No abnormal soft tissue paraspinal mass or infiltration. Disc levels: Degenerative changes in the cervical spine with narrowed disc spaces and associated endplate hypertrophic changes. Degenerative changes in the facet joints and at C1-2. Upper chest: Motion artifact limits examination. Possible patchy airspace infiltrates in the right apex. Edema versus pneumonia. Other: None. IMPRESSION: 1. No acute intracranial abnormalities. Chronic atrophy and Hebert vessel ischemic changes. 2. Normal alignment of the cervical spine. Degenerative changes. No acute displaced fractures identified. Electronically Signed   By: Lucienne Capers M.D.   On: 12/30/2017 21:56    Microbiology: Recent Results (from the past 240 hour(s))  Culture, blood (Routine X 2) w Reflex to ID Panel     Status: None (Preliminary result)   Collection Time: 12/31/17 12:09 AM  Result Value Ref Range Status   Specimen Description BLOOD RIGHT ANTECUBITAL  Final   Special Requests   Final    BOTTLES DRAWN AEROBIC AND ANAEROBIC Blood Culture results may not be optimal due to an inadequate volume of blood received in culture bottles   Culture   Final    NO GROWTH 3 DAYS Performed at [facility], [place] [address], [place], [place] [address]    Report  Status PENDING  Incomplete  Culture, blood (Routine X 2) w Reflex to ID Panel     Status: None (Preliminary result)   Collection Time: 12/31/17 12:17 AM  Result Value Ref Range Status   Specimen Description BLOOD LEFT HAND  Final   Special Requests   Final    BOTTLES DRAWN AEROBIC AND ANAEROBIC Blood Culture results may not be optimal due to an inadequate volume of blood received in culture bottles   Culture   Final    NO GROWTH 3 DAYS Performed at [facility], [place] [address] [place], [place] [address]    Report Status PENDING  Incomplete     Labs: Basic Metabolic Panel: Recent Labs  Lab 12/31/17 1631 01/01/18 0354 01/02/18 0337 01/03/18 0319 01/04/18 0450  NA 136 133* 137 138 139  K 2.9* 4.4 4.6 4.3 4.1  CL 97* 101 109 107 106  CO2 30 23 22 24 25   GLUCOSE 212* 161* 120* 101* 108*  BUN 25* 31* 22 24* 14  CREATININE 1.72* 1.48* 1.05* 1.19* 0.91  CALCIUM 8.9 8.6* 8.5* 8.4* 8.7*  MG 1.8  --   --  2.0  --    Liver Function Tests: Recent Labs  Lab 12/30/17 2124 01/01/18 0354 01/03/18 0319 01/04/18 0450  AST 114* 55* 40 30  ALT 36 27 40 38  ALKPHOS 60 55 53 49  BILITOT 1.1 0.9 0.6 0.6  PROT 7.2 5.7* 5.4* 5.5*  ALBUMIN 3.8  2.7* 2.3* 2.5*   No results for input(s): LIPASE, AMYLASE in the last 168 hours. Recent Labs  Lab 12/31/17 0009  AMMONIA 14   CBC: Recent Labs  Lab 12/30/17 2124  12/31/17 0012 01/01/18 0354 01/02/18 0337 01/03/18 0319 01/04/18 0450  WBC 20.3*  --  19.9* 12.3* 10.8* 8.9 7.7  NEUTROABS 19.1*  --   --   --   --   --   --   HGB 13.4   < > 12.4 10.3* 9.1* 8.7* 9.2*  HCT 41.0   < > 37.8 30.6* 28.5* 27.4* 27.7*  MCV 93.4  --  94.3 93.9 95.3 96.8 95.5  PLT 284  --  263 198 230 245 267   < > = values in this interval not displayed.   Cardiac Enzymes: Recent Labs  Lab 12/30/17 2124 12/31/17 0008 12/31/17 0012 12/31/17 1153 01/01/18 0354 01/04/18 0450  CKTOTAL 3,293*  --  2,999*  --  592* 19  TROPONINI  --  0.21*  --  0.13*  --    --    BNP: BNP (last 3 results) Recent Labs    12/31/17 0008  BNP 257.0*    ProBNP (last 3 results) No results for input(s): PROBNP in the last 8760 hours.  CBG: No results for input(s): GLUCAP in the last 168 hours.     Signed:  Cristal Ford  [facility] 01/04/2018, 12:07 PM

## 2018-01-05 LAB — CULTURE, BLOOD (ROUTINE X 2)
CULTURE: NO GROWTH
Culture: NO GROWTH

## 2018-01-07 DIAGNOSIS — M069 Rheumatoid arthritis, unspecified: Secondary | ICD-10-CM | POA: Diagnosis not present

## 2018-01-07 DIAGNOSIS — R399 Unspecified symptoms and signs involving the genitourinary system: Secondary | ICD-10-CM | POA: Diagnosis not present

## 2018-01-07 DIAGNOSIS — Z7901 Long term (current) use of anticoagulants: Secondary | ICD-10-CM | POA: Diagnosis not present

## 2018-01-07 DIAGNOSIS — W19XXXD Unspecified fall, subsequent encounter: Secondary | ICD-10-CM | POA: Diagnosis not present

## 2018-01-07 DIAGNOSIS — I480 Paroxysmal atrial fibrillation: Secondary | ICD-10-CM | POA: Diagnosis not present

## 2018-01-07 DIAGNOSIS — E785 Hyperlipidemia, unspecified: Secondary | ICD-10-CM | POA: Diagnosis not present

## 2018-01-07 DIAGNOSIS — G9341 Metabolic encephalopathy: Secondary | ICD-10-CM | POA: Diagnosis not present

## 2018-01-07 DIAGNOSIS — D631 Anemia in chronic kidney disease: Secondary | ICD-10-CM | POA: Diagnosis not present

## 2018-01-07 DIAGNOSIS — N183 Chronic kidney disease, stage 3 (moderate): Secondary | ICD-10-CM | POA: Diagnosis not present

## 2018-01-07 DIAGNOSIS — Z09 Encounter for follow-up examination after completed treatment for conditions other than malignant neoplasm: Secondary | ICD-10-CM | POA: Diagnosis not present

## 2018-01-09 DIAGNOSIS — D631 Anemia in chronic kidney disease: Secondary | ICD-10-CM | POA: Diagnosis not present

## 2018-01-09 DIAGNOSIS — M069 Rheumatoid arthritis, unspecified: Secondary | ICD-10-CM | POA: Diagnosis not present

## 2018-01-09 DIAGNOSIS — G9341 Metabolic encephalopathy: Secondary | ICD-10-CM | POA: Diagnosis not present

## 2018-01-09 DIAGNOSIS — N183 Chronic kidney disease, stage 3 (moderate): Secondary | ICD-10-CM | POA: Diagnosis not present

## 2018-01-09 DIAGNOSIS — W19XXXD Unspecified fall, subsequent encounter: Secondary | ICD-10-CM | POA: Diagnosis not present

## 2018-01-09 DIAGNOSIS — I480 Paroxysmal atrial fibrillation: Secondary | ICD-10-CM | POA: Diagnosis not present

## 2018-01-13 DIAGNOSIS — M069 Rheumatoid arthritis, unspecified: Secondary | ICD-10-CM | POA: Diagnosis not present

## 2018-01-13 DIAGNOSIS — D631 Anemia in chronic kidney disease: Secondary | ICD-10-CM | POA: Diagnosis not present

## 2018-01-13 DIAGNOSIS — N183 Chronic kidney disease, stage 3 (moderate): Secondary | ICD-10-CM | POA: Diagnosis not present

## 2018-01-13 DIAGNOSIS — G9341 Metabolic encephalopathy: Secondary | ICD-10-CM | POA: Diagnosis not present

## 2018-01-13 DIAGNOSIS — W19XXXD Unspecified fall, subsequent encounter: Secondary | ICD-10-CM | POA: Diagnosis not present

## 2018-01-13 DIAGNOSIS — I480 Paroxysmal atrial fibrillation: Secondary | ICD-10-CM | POA: Diagnosis not present

## 2018-01-14 DIAGNOSIS — I480 Paroxysmal atrial fibrillation: Secondary | ICD-10-CM | POA: Diagnosis not present

## 2018-01-14 DIAGNOSIS — D631 Anemia in chronic kidney disease: Secondary | ICD-10-CM | POA: Diagnosis not present

## 2018-01-14 DIAGNOSIS — W19XXXD Unspecified fall, subsequent encounter: Secondary | ICD-10-CM | POA: Diagnosis not present

## 2018-01-14 DIAGNOSIS — G9341 Metabolic encephalopathy: Secondary | ICD-10-CM | POA: Diagnosis not present

## 2018-01-14 DIAGNOSIS — M069 Rheumatoid arthritis, unspecified: Secondary | ICD-10-CM | POA: Diagnosis not present

## 2018-01-14 DIAGNOSIS — N183 Chronic kidney disease, stage 3 (moderate): Secondary | ICD-10-CM | POA: Diagnosis not present

## 2018-01-16 DIAGNOSIS — W19XXXD Unspecified fall, subsequent encounter: Secondary | ICD-10-CM | POA: Diagnosis not present

## 2018-01-16 DIAGNOSIS — N183 Chronic kidney disease, stage 3 (moderate): Secondary | ICD-10-CM | POA: Diagnosis not present

## 2018-01-16 DIAGNOSIS — D631 Anemia in chronic kidney disease: Secondary | ICD-10-CM | POA: Diagnosis not present

## 2018-01-16 DIAGNOSIS — M069 Rheumatoid arthritis, unspecified: Secondary | ICD-10-CM | POA: Diagnosis not present

## 2018-01-16 DIAGNOSIS — G9341 Metabolic encephalopathy: Secondary | ICD-10-CM | POA: Diagnosis not present

## 2018-01-16 DIAGNOSIS — I480 Paroxysmal atrial fibrillation: Secondary | ICD-10-CM | POA: Diagnosis not present

## 2018-01-19 ENCOUNTER — Ambulatory Visit: Payer: Medicare Other | Admitting: Cardiology

## 2018-01-20 DIAGNOSIS — W19XXXD Unspecified fall, subsequent encounter: Secondary | ICD-10-CM | POA: Diagnosis not present

## 2018-01-20 DIAGNOSIS — I480 Paroxysmal atrial fibrillation: Secondary | ICD-10-CM | POA: Diagnosis not present

## 2018-01-20 DIAGNOSIS — M069 Rheumatoid arthritis, unspecified: Secondary | ICD-10-CM | POA: Diagnosis not present

## 2018-01-20 DIAGNOSIS — G9341 Metabolic encephalopathy: Secondary | ICD-10-CM | POA: Diagnosis not present

## 2018-01-20 DIAGNOSIS — D631 Anemia in chronic kidney disease: Secondary | ICD-10-CM | POA: Diagnosis not present

## 2018-01-20 DIAGNOSIS — N183 Chronic kidney disease, stage 3 (moderate): Secondary | ICD-10-CM | POA: Diagnosis not present

## 2018-01-21 DIAGNOSIS — D631 Anemia in chronic kidney disease: Secondary | ICD-10-CM | POA: Diagnosis not present

## 2018-01-21 DIAGNOSIS — G9341 Metabolic encephalopathy: Secondary | ICD-10-CM | POA: Diagnosis not present

## 2018-01-21 DIAGNOSIS — I480 Paroxysmal atrial fibrillation: Secondary | ICD-10-CM | POA: Diagnosis not present

## 2018-01-21 DIAGNOSIS — W19XXXD Unspecified fall, subsequent encounter: Secondary | ICD-10-CM | POA: Diagnosis not present

## 2018-01-21 DIAGNOSIS — N183 Chronic kidney disease, stage 3 (moderate): Secondary | ICD-10-CM | POA: Diagnosis not present

## 2018-01-21 DIAGNOSIS — M069 Rheumatoid arthritis, unspecified: Secondary | ICD-10-CM | POA: Diagnosis not present

## 2018-01-22 ENCOUNTER — Ambulatory Visit (INDEPENDENT_AMBULATORY_CARE_PROVIDER_SITE_OTHER): Payer: Medicare Other | Admitting: Cardiology

## 2018-01-22 ENCOUNTER — Encounter: Payer: Self-pay | Admitting: Cardiology

## 2018-01-22 VITALS — BP 142/90 | HR 84 | Ht 66.0 in | Wt 155.2 lb

## 2018-01-22 DIAGNOSIS — M069 Rheumatoid arthritis, unspecified: Secondary | ICD-10-CM | POA: Diagnosis not present

## 2018-01-22 DIAGNOSIS — Z7901 Long term (current) use of anticoagulants: Secondary | ICD-10-CM | POA: Insufficient documentation

## 2018-01-22 DIAGNOSIS — I5189 Other ill-defined heart diseases: Secondary | ICD-10-CM | POA: Diagnosis not present

## 2018-01-22 DIAGNOSIS — N289 Disorder of kidney and ureter, unspecified: Secondary | ICD-10-CM

## 2018-01-22 DIAGNOSIS — D649 Anemia, unspecified: Secondary | ICD-10-CM | POA: Insufficient documentation

## 2018-01-22 DIAGNOSIS — W19XXXD Unspecified fall, subsequent encounter: Secondary | ICD-10-CM | POA: Diagnosis not present

## 2018-01-22 DIAGNOSIS — R778 Other specified abnormalities of plasma proteins: Secondary | ICD-10-CM

## 2018-01-22 DIAGNOSIS — I480 Paroxysmal atrial fibrillation: Secondary | ICD-10-CM

## 2018-01-22 DIAGNOSIS — R7989 Other specified abnormal findings of blood chemistry: Secondary | ICD-10-CM

## 2018-01-22 MED ORDER — AMIODARONE HCL 200 MG PO TABS: 200.0000 mg | ORAL_TABLET | Freq: Every day | ORAL | 1 refills | Status: DC

## 2018-01-22 NOTE — Progress Notes (Signed)
01/22/2018 Danielle Hebert   1934/09/02  [id number]  Primary Physician Maurice Small, MD Primary Cardiologist: Dr Gwenlyn Found  HPI:  Pleasant 83 y/o female seen today as a post hospital follow up.  The pt was admitted 12/30/17 after being found down at home.  In speaking with the patient's son today he thinks she was down for 48 hours.  On admission she had evidence of rhabdomyolysis with AKI and elevated CK.  Cardiology was consulted for AF which was new for her.  The patient was hydrated and anticoagulated.  Echo showed her EF to be 55-60% with basilar hypertrophy, grade 2 DD, and normal LA size.  Amiodarone was added.  She converted before discharge.    She is in the office today for follow up.  She has done well, no recurrent AF as far as she can tell.  She apparently broke a tooth during this event and will need oral surgery.  We'll need to discuss the timing of stopping her Eliquis with Dr Gwenlyn Found.     Current Outpatient Medications  Medication Sig Dispense Refill  . ALPRAZolam (XANAX) 0.5 MG tablet Take 0.5 mg by mouth 2 (two) times daily.  1  . amiodarone (PACERONE) 200 MG tablet Take 1 tablet (200 mg total) by mouth daily. 90 tablet 1  . apixaban (ELIQUIS) 5 MG TABS tablet Take 1 tablet (5 mg total) by mouth 2 (two) times daily. 60 tablet 0  . cetirizine (ZYRTEC) 10 MG tablet Take 10 mg by mouth daily as needed.  11  . cholecalciferol (VITAMIN D) 1000 UNITS tablet Take 1,000 Units by mouth daily.    . cycloSPORINE (RESTASIS MULTIDOSE) 0.05 % ophthalmic emulsion Place 1 drop into both eyes 2 (two) times daily.    . fluticasone (FLONASE) 50 MCG/ACT nasal spray Place 1 spray into both nostrils daily.  11  . folic acid (FOLVITE) 1 MG tablet Take 1 mg by mouth daily.  0  . methotrexate 2.5 MG tablet Take 12.5 mg by mouth once a week.  0  . metroNIDAZOLE (METROCREAM) 0.75 % cream Apply 1 application topically 2 (two) times daily. Apply cream to affected area  3  . Omega-3 Fatty Acids (FISH  OIL) 1200 MG CAPS Take by mouth.    Marland Kitchen omeprazole (PRILOSEC) 40 MG capsule Take 40 mg by mouth daily.  11   No current facility-administered medications for this visit.     Allergies  Allergen Reactions  . Ciprofloxacin   . Remicade [Infliximab]     Past Medical History:  Diagnosis Date  . Hyperlipidemia   . Rheumatoid arthritis ([place])     Social History   Socioeconomic History  . Marital status: Married    Spouse name: Not on file  . Number of children: Not on file  . Years of education: Not on file  . Highest education level: Not on file  Occupational History  . Not on file  Social Needs  . Financial resource strain: Not on file  . Food insecurity:    Worry: Not on file    Inability: Not on file  . Transportation needs:    Medical: Not on file    Non-medical: Not on file  Tobacco Use  . Smoking status: Never Smoker  . Smokeless tobacco: Never Used  Substance and Sexual Activity  . Alcohol use: No  . Drug use: No  . Sexual activity: Not on file  Lifestyle  . Physical activity:    Days per week: Not on  file    Minutes per session: Not on file  . Stress: Not on file  Relationships  . Social connections:    Talks on phone: Not on file    Gets together: Not on file    Attends religious service: Not on file    Active member of club or organization: Not on file    Attends meetings of clubs or organizations: Not on file    Relationship status: Not on file  . Intimate partner violence:    Fear of current or ex partner: Not on file    Emotionally abused: Not on file    Physically abused: Not on file    Forced sexual activity: Not on file  Other Topics Concern  . Not on file  Social History Narrative  . Not on file     Family History  Problem Relation Age of Onset  . Cancer Mother        lung  . Cancer Sister        breast     Review of Systems: General: negative for chills, fever, night sweats or weight changes.  Cardiovascular: negative for chest  pain, dyspnea on exertion, edema, orthopnea, palpitations, paroxysmal nocturnal dyspnea or shortness of breath Dermatological: negative for rash Respiratory: negative for cough or wheezing Urologic: negative for hematuria Abdominal: negative for nausea, vomiting, diarrhea, bright red blood per rectum, melena, or hematemesis Neurologic: negative for visual changes, syncope, or dizziness All other systems reviewed and are otherwise negative except as noted above.    Blood pressure (!) 142/90, pulse 84, height 5\' 6"  (1.676 m), weight 155 lb 3.2 oz (70.4 kg).  General appearance: alert, cooperative and no distress Neck: no carotid bruit and no JVD Lungs: clear to auscultation bilaterally Heart: regular rate and rhythm Extremities: no edema Skin: pale, dry Neurologic: Grossly normal  EKG NSR, QTc 434- HR 84  ASSESSMENT AND PLAN:   Fall Pt admitted after a fall at home with evidence of Rhabdomyolysis. Fall suspected to be secondary to AF with RVR (new).  Paroxysmal atrial fibrillation ([place]) New onset PAF with RVR 12/30/17. Poorly tolerated, Amiodarone and Eliquis added  Elevated troponin Demand ischemia from AF with RVR  Acute renal insufficiency SCr normalized by discharge 82/95/62  Diastolic dysfunction Grade 2 by echo with EF 55-60% and basilar hypertrophy (LA WNL)  Rheumatoid arthritis (HCC) Weekly MTX  Anticoagulated Eliquis- CHADS VASC=3  Chronic anemia Hgb 9.2 at discharge- appears to be baseline for her.    PLAN  The pt's son tells me she will need general anesthesia for dental root repair.  From a cardiac standpoint she is an acceptable risk for this.  The only question will the timing of stopping her Eliquis after her recent spontaneous conversion to NSR on Amiodarone.  They will ask the oral surgeon to send a pre op clearance request when they see him.  I'll ask Dr Gwenlyn Found about the Eliquis.   Kerin Ransom PA-C 01/22/2018 10:47 AM

## 2018-01-22 NOTE — Assessment & Plan Note (Signed)
Weekly MTX

## 2018-01-22 NOTE — Assessment & Plan Note (Signed)
New onset PAF with RVR 12/30/17. Poorly tolerated, Amiodarone and Eliquis added

## 2018-01-22 NOTE — Assessment & Plan Note (Signed)
Grade 2 by echo with EF 55-60% and basilar hypertrophy (LA WNL)

## 2018-01-22 NOTE — Assessment & Plan Note (Signed)
Eliquis- CHADS VASC=3 

## 2018-01-22 NOTE — Assessment & Plan Note (Signed)
SCr normalized by discharge 01/04/18

## 2018-01-22 NOTE — Patient Instructions (Signed)
Medication Instructions:  No Changes. If you need a refill on your cardiac medications before your next appointment, please call your pharmacy.   Lab work: CBC, CMET, TSH at the end of January. If you have labs (blood work) drawn today and your tests are completely normal, you will receive your results only by: Marland Kitchen MyChart Message (if you have MyChart) OR . A paper copy in the mail If you have any lab test that is abnormal or we need to change your treatment, we will call you to review the results.  Testing/Procedures: None Ordered.  Follow-Up: At [facility], you and your health needs are our priority.  As part of our continuing mission to provide you with exceptional heart care, we have created designated Provider Care Teams.  These Care Teams include your primary Cardiologist (physician) and Advanced Practice Providers (APPs -  Physician Assistants and Nurse Practitioners) who all work together to provide you with the care you need, when you need it. You will need a follow up appointment in 3 months.  You may see Quay Burow, MD or one of the following Advanced Practice Providers on your designated Care Team:   Kerin Ransom, PA-C Roby Lofts, [place] . Sande Rives, PA-C  Any Other Special Instructions Will Be Listed Below (If Applicable). None

## 2018-01-22 NOTE — Assessment & Plan Note (Signed)
Hgb 9.2 at discharge- appears to be baseline for her.

## 2018-01-22 NOTE — Assessment & Plan Note (Signed)
Demand ischemia from AF with RVR

## 2018-01-22 NOTE — Assessment & Plan Note (Signed)
Pt admitted after a fall at home with evidence of Rhabdomyolysis. Fall suspected to be secondary to AF with RVR (new).

## 2018-01-26 ENCOUNTER — Telehealth: Payer: Self-pay | Admitting: Cardiology

## 2018-01-26 NOTE — Telephone Encounter (Signed)
Returned call to patient she was calling to make sure ok to take hctz with amiodarone.Stated she saw PCP today B/P 152/98 and PCP told her to restart hctz 25 mg daily.Advised ok to take hctz as directed.

## 2018-01-26 NOTE — Telephone Encounter (Signed)
New Message   Pt c/o medication issue:  1. Name of Medication:amiodarone (PACERONE) 200 MG tablet  2. How are you currently taking this medication (dosage and times per day)?   3. Are you having a reaction (difficulty breathing--STAT)?   4. What is your medication issue? Patient states that she takes amiodarone and at one time she was taking hydrochlorizade and she is wondering should she start taking the hydrochlorizade again. Please call to discuss.

## 2018-01-27 DIAGNOSIS — I480 Paroxysmal atrial fibrillation: Secondary | ICD-10-CM | POA: Diagnosis not present

## 2018-01-27 DIAGNOSIS — G9341 Metabolic encephalopathy: Secondary | ICD-10-CM | POA: Diagnosis not present

## 2018-01-27 DIAGNOSIS — D631 Anemia in chronic kidney disease: Secondary | ICD-10-CM | POA: Diagnosis not present

## 2018-01-27 DIAGNOSIS — W19XXXD Unspecified fall, subsequent encounter: Secondary | ICD-10-CM | POA: Diagnosis not present

## 2018-01-27 DIAGNOSIS — M069 Rheumatoid arthritis, unspecified: Secondary | ICD-10-CM | POA: Diagnosis not present

## 2018-01-27 DIAGNOSIS — N183 Chronic kidney disease, stage 3 (moderate): Secondary | ICD-10-CM | POA: Diagnosis not present

## 2018-01-28 ENCOUNTER — Telehealth: Payer: Self-pay

## 2018-01-28 DIAGNOSIS — M069 Rheumatoid arthritis, unspecified: Secondary | ICD-10-CM | POA: Diagnosis not present

## 2018-01-28 DIAGNOSIS — G9341 Metabolic encephalopathy: Secondary | ICD-10-CM | POA: Diagnosis not present

## 2018-01-28 DIAGNOSIS — I480 Paroxysmal atrial fibrillation: Secondary | ICD-10-CM | POA: Diagnosis not present

## 2018-01-28 DIAGNOSIS — W19XXXD Unspecified fall, subsequent encounter: Secondary | ICD-10-CM | POA: Diagnosis not present

## 2018-01-28 DIAGNOSIS — D631 Anemia in chronic kidney disease: Secondary | ICD-10-CM | POA: Diagnosis not present

## 2018-01-28 DIAGNOSIS — N183 Chronic kidney disease, stage 3 (moderate): Secondary | ICD-10-CM | POA: Diagnosis not present

## 2018-01-28 NOTE — Telephone Encounter (Signed)
SPOKE TO PATIENT-  SHE  THOUGHT SHE UNDERSTOOD TO STOP ELIQUIS AFTER Feb 04, 2018. SHE WANTED TO KNOW IF SHE WOULD BE TAKING ELIQUIS  AFTER ORAL SURGERY.   RN INFORMED PATIENT  THAT SHE COULD NOT STOP MEDICATION UNTIL THAT TO HAVE SURGERY BUT WOULD RESTART THERE AFTER.  FULL INSTRUCTION WILL BE GIVEN ONCE  ORAL SURGEON  SENDS  ORAL SURGICAL REQUEST   SHE VERBALIZED UNDERSTANDING.

## 2018-01-28 NOTE — Telephone Encounter (Signed)
Please call patient back she has further questions.

## 2018-01-28 NOTE — Telephone Encounter (Signed)
Patient has been notified directly and voiced understanding.   

## 2018-01-28 NOTE — Telephone Encounter (Signed)
-----   Message from Erlene Quan, [place] sent at 01/26/2018  6:27 PM EST -----  T please let Ms Lawn[place]ak know we do not want her to stop her Eliquis till after Nov 20th- that will be 30 days after converted to NSR.  Her oral surgeon will need to send [place] a formal pre op clearance request.  Kerin Ransom PA-C 01/26/2018 6:29 PM ----- Message ----- From: Pixie Casino, MD Sent: 01/22/2018   8:45 PM EST To: Erlene Quan, PA-C  We usually recommend 1 month of anticoagulation after afib with conversion if the episode was >48 hrs duration, before holding it.  -[place] (covering for [employer])  ----- Message ----- From: Ilean China Sent: 01/22/2018  10:51 AM EST To: Lorretta Harp, MD  Can you comment on stopping eliquis

## 2018-02-02 ENCOUNTER — Other Ambulatory Visit: Payer: Self-pay | Admitting: Cardiovascular Disease

## 2018-02-02 ENCOUNTER — Telehealth: Payer: Self-pay | Admitting: Cardiovascular Disease

## 2018-02-02 MED ORDER — AMIODARONE HCL 200 MG PO TABS: 200.0000 mg | ORAL_TABLET | Freq: Every day | ORAL | 1 refills | Status: DC

## 2018-02-02 NOTE — Telephone Encounter (Signed)
Follow Up:   Patient calling back concerning her medications. Please patient

## 2018-02-02 NOTE — Telephone Encounter (Signed)
  Pt c/o medication issue:  1. Name of Medication: (PACERONE) 200 MG tablet apixaban (ELIQUIS) 5 MG TABS tablet.   2. How are you currently taking this medication (dosage and times per day)?   3. Are you having a reaction (difficulty breathing--STAT)? no  4. What is your medication issue? Patient states she is feeling very fatigued, no energy and would like to know if the medications are causing this.

## 2018-02-02 NOTE — Telephone Encounter (Signed)
Rx(s) sent to pharmacy electronically.  

## 2018-02-02 NOTE — Telephone Encounter (Signed)
Spoke with pt. Pt sts that she has been having constant fatigue since her recent d/c from the hospital. She wants to know if it could be caused by her mediations Eliquis or Amiodarone. Adv pt that Eliquis should not be the cause of her fatigue but Amio could be. After review of the pt recent labs notes the pt has chronic but stable anemia. Adv pt that could also cause some fatigue. Pt denies any other symptoms, she reports no signs of bleeding, she also has RA and received infusions every 6 wks.  Adv pt that there are several things that could be the cause og her fatigue. Pt wants to know why she was not started on something like Iron for her fatigue and if this would be beneficial. Pt was last seen by Kerin Ransom, PA. Adv pt that Lurena Joiner is not in the office today, I will fwd the question to [facility] and we will call back with his response. Pt agreeable with plan.

## 2018-02-02 NOTE — Telephone Encounter (Signed)
Pt sts that she is scheduled to have Oral sx (extraction) on Fri 11/22. Pt sts that she was told she should stop Eliquis the day before the sx and resume the day after. Adv pt that I dont see that noted per Lurena Joiner we were awaiting a formal rqst from the oral sx. Adv pt that her oral sx needs to send [place] a formal rqst in writing asap. Pt sts that she will call their office now, provided pt our fax number for them to fax the rqst.    Message from Erlene Quan, PA-C sent at 01/26/2018  6:27 PM EST -----  please let Ms [place]gen know we do not want her to stop her Eliquis till after Nov 20th- that will be 30 days after converted to NSR.  Her oral surgeon will need to send [place] a formal pre op clearance request.

## 2018-02-02 NOTE — Telephone Encounter (Signed)
 *  STAT* If patient is at the pharmacy, call can be transferred to refill team.   1. Which medications need to be refilled? (please list name of each medication and dose if known) amiodarone (PACERONE) 200 MG tablet  2. Which pharmacy/location (including street and city if local pharmacy) is medication to be sent to? [facility]  3. Do they need a 30 day or 90 day supply? 90  Patient is currently out of this medication

## 2018-02-03 ENCOUNTER — Telehealth: Payer: Self-pay

## 2018-02-03 NOTE — Telephone Encounter (Signed)
   [facility] Pre-operative Risk Assessment    Request for surgical clearance:  1. What type of surgery is being performed? Minor surgical procedure ( tooth extraction )  2. When is this surgery scheduled? 02/06/18  3. What type of clearance is required (medical clearance vs. Pharmacy clearance to hold med vs. Both)? Pharmacy  4. Are there any medications that need to be held prior to surgery and how long? Eliquis  5. Practice name and name of physician performing surgery? [facility]  6. What is your office phone number [phone]    7.   What is your office fax number [phone]  8.   Anesthesia type Light IV sedation ( midazolam,propofol )   Kathyrn Lass 02/03/2018, 2:12 PM  _________________________________________________________________   (provider comments below)

## 2018-02-03 NOTE — Telephone Encounter (Signed)
Our recommendation is to not hold anticoagulation for single tooth extraction.

## 2018-02-03 NOTE — Telephone Encounter (Signed)
Pharmacy to review eliquis  Patient was already cleared by Kerin Ransom for the procedure during recent office visit, please forward last office note along with pharmacist's recommendation to requesting provider.

## 2018-02-04 ENCOUNTER — Telehealth: Payer: Self-pay | Admitting: Cardiovascular Disease

## 2018-02-04 DIAGNOSIS — M0589 Other rheumatoid arthritis with rheumatoid factor of multiple sites: Secondary | ICD-10-CM | POA: Diagnosis not present

## 2018-02-04 DIAGNOSIS — Z79899 Other long term (current) drug therapy: Secondary | ICD-10-CM | POA: Diagnosis not present

## 2018-02-04 NOTE — Telephone Encounter (Signed)
New Message:    Patient calling concerning some medications that need to be stop and she would like to know if some can call her back.

## 2018-02-04 NOTE — Telephone Encounter (Signed)
Spoke with Doctor that is completing the procedure who stated they were only pulling one tooth. Advised with Kerin Ransom PA-C who agreed not to hold the anticoagulation medication, Doctor was verified we will send clearance over to them.

## 2018-02-04 NOTE — Telephone Encounter (Signed)
Fortunately Danielle Hebert will not require "oral surgery" as the family had feared.  After speaking with the dentist office today she apparently will only will require a tooth extraction.  We do not recommend holding anticoagulation for a single tooth extraction.  Kerin Ransom PA-C 02/04/2018 3:36 PM

## 2018-02-04 NOTE — Telephone Encounter (Addendum)
Spoke with patient and she stated that she was having a root cut out not just an extraction. She does not understand why the Eliquis should not be held and is concerned about bleeding. Advised patient that per Lurena Joiner it is just an extraction but would follow up with dental office tomorrow and call her back.

## 2018-02-04 NOTE — Telephone Encounter (Signed)
Called dentist office to verify what type of procedure is to be completed, depending on what type of procedure it is depends on how we proceed with Eliquis.  Dentist office will call back when coordinator returns to desk. Direct number given to preop, and number to office given as well.

## 2018-02-05 NOTE — Telephone Encounter (Signed)
Patient called 02/04/18 stating that she was having the root of her tooth "cut" out. Explained to her that our office did speak with dental office and was advised it was a single tooth extraction but no mention of any "cutting". Did call the dental office this morning and was told it was a single "surgical" tooth extraction. Discussed further with Doreene Burke PA who discussed with Alena Bills D. Eliquis to be held 24 hours prior and resumed as soon as safely recommended by dentist/oral surgeon. Advised patient and will send to dental office.

## 2018-02-05 NOTE — Telephone Encounter (Signed)
Spoke with dental office this morning and was told it was a single "surgical" tooth extraction. Discussed further with Doreene Burke PA who discussed with Alena Bills D. Eliquis to be held 24 hours prior and resumed as soon as safely recommended by dentist/oral surgeon. Advised patient and will send to dental office.

## 2018-02-16 ENCOUNTER — Telehealth: Payer: Self-pay | Admitting: Cardiovascular Disease

## 2018-02-16 NOTE — Telephone Encounter (Signed)
Routed to PharmD for advice

## 2018-02-16 NOTE — Telephone Encounter (Signed)
It is most likely the amiodarone which is causing nausea.  Will need to review with MD to potentially lower dose.

## 2018-02-16 NOTE — Telephone Encounter (Signed)
Pt c/o medication issue:  1. Name of Medication:   apixaban (ELIQUIS) 5 MG TABS tablet amiodarone (PACERONE) 200 MG tablet  2. How are you currently taking this medication (dosage and times per day)?  eliquis- Take 1 tablet (5 mg total) by mouth 2 (two) times daily.  Amiodarone-  Take 1 tablet (200 mg total) by mouth daily.   3. Are you having a reaction (difficulty breathing--STAT)? no  4. What is your medication issue? Patient states it is making her nauseous. However she is not sure which medication is the one that is making her feel this way.

## 2018-02-16 NOTE — Telephone Encounter (Signed)
Sending to [facility] as last seen 01/22/2018; please advise regarding med question below

## 2018-02-16 NOTE — Telephone Encounter (Signed)
Please advise on if medication should be lowered due to nausea.  Thanks!  Attempted to contact patient to notify her of the plan to speak with Dr.Berry, patient number is unavailable.

## 2018-02-16 NOTE — Telephone Encounter (Signed)
It does not appear that I have seen this patient in the office.

## 2018-02-17 MED ORDER — AMIODARONE HCL 200 MG PO TABS: 100.0000 mg | ORAL_TABLET | Freq: Every day | ORAL | 1 refills | Status: DC

## 2018-02-17 NOTE — Telephone Encounter (Signed)
Decrease Amiodarone to 100mg  daily  Melicia Esqueda PA-C 02/17/2018 8:21 AM

## 2018-02-17 NOTE — Telephone Encounter (Signed)
Yes should be OK dicyclomine to take with Amiodarone  Ellouise Mcwhirter PA-C 02/17/2018 2:53 PM

## 2018-02-17 NOTE — Telephone Encounter (Signed)
Spoke with pt, aware to decrease amiodarone to 1/2 tablet daily. Patient would like to know if she can take dicyclomine. She would like to know from the doctor if okay to take. Will forward to [employer] pa

## 2018-02-17 NOTE — Telephone Encounter (Signed)
Spoke with pt, aware okay to take.

## 2018-02-23 ENCOUNTER — Telehealth: Payer: Self-pay | Admitting: Cardiovascular Disease

## 2018-02-23 NOTE — Telephone Encounter (Signed)
Please advise. Patient called in stating that her medication was decreased on 12/02 for amiodarone. She states that starting yesterday she began having severe nausea, and diarrhea. Patient states that she does not like this medication at all and would want to change if she could. Patient would like to see Dr.Berry himself. I gave patient first available in January, but can you please advise on medication for now.  Thank you!

## 2018-02-23 NOTE — Telephone Encounter (Signed)
Pt c/o medication issue:  1. Name of Medication: amiodarone (PACERONE) 200 MG tablet  2. How are you currently taking this medication (dosage and times per day)?   Take 0.5 tablets (100 mg total) by mouth daily.  3. Are you having a reaction (difficulty breathing--STAT)? NO  4. What is your medication issue? Patient states that Dr. Gwenlyn Found lower her dosage from one tablet to half a tablet, however she is still experiencing nausea and diarrhea.

## 2018-02-23 NOTE — Telephone Encounter (Signed)
Called advised patient of note from PA-C, patient states that she has continued to have these issues and was upset that there was no changes that could be made. I asked patient if she had notified her PCP, and patient denied and states she only wants to see Dr.Berry.  I placed patient in an appointment for this week for a 48 hour spot patient verbalized understanding.

## 2018-02-23 NOTE — Telephone Encounter (Signed)
She should be on Amiodarone 100 mg daily.  It is unlikely but not impossible that the amiodarone is causing her nausea.  If she wants to stop it that is up to her, she runs the risk of going back in atrial fibrillation.  Make sure she has a follow-up with Dr. Gwenlyn Found and not an APP-her reqeust.

## 2018-02-25 ENCOUNTER — Encounter: Payer: Self-pay | Admitting: Cardiovascular Disease

## 2018-02-25 ENCOUNTER — Ambulatory Visit (INDEPENDENT_AMBULATORY_CARE_PROVIDER_SITE_OTHER): Payer: Medicare Other | Admitting: Cardiovascular Disease

## 2018-02-25 VITALS — BP 147/78 | HR 80

## 2018-02-25 DIAGNOSIS — E782 Mixed hyperlipidemia: Secondary | ICD-10-CM | POA: Diagnosis not present

## 2018-02-25 DIAGNOSIS — I480 Paroxysmal atrial fibrillation: Secondary | ICD-10-CM

## 2018-02-25 DIAGNOSIS — Z79899 Other long term (current) drug therapy: Secondary | ICD-10-CM | POA: Diagnosis not present

## 2018-02-25 NOTE — Patient Instructions (Addendum)
Medication Instructions:  Your physician has recommended you make the following change in your medication:   STOP TAKING YOUR AMIODARONE.  If you need a refill on your cardiac medications before your next appointment, please call your pharmacy.   Lab work: Your physician recommends that you return for lab work in [place]; LIVER/CMP  If you have labs (blood work) drawn today and your tests are completely normal, you will receive your results only by: Marland Kitchen MyChart Message (if you have MyChart) OR . A paper copy in the mail If you have any lab test that is abnormal or we need to change your treatment, we will call you to review the results.  Testing/Procedures: NONE  Follow-Up: At [facility], you and your health needs are our priority.  As part of our continuing mission to provide you with exceptional heart care, we have created designated Provider Care Teams.  These Care Teams include your primary Cardiologist (physician) and Advanced Practice Providers (APPs -  Physician Assistants and Nurse Practitioners) who all work together to provide you with the care you need, when you need it.  You will need a follow up appointment in 6 weeks [place], [place] and 3 MONTHS WITH DR. Gwenlyn Found.

## 2018-02-25 NOTE — Assessment & Plan Note (Signed)
History of hyperlipidemia not on statin therapy lipid profile performed 12/31/2017 revealing total cholesterol of 186, LDL 103 and HDL of 69.

## 2018-02-25 NOTE — Assessment & Plan Note (Signed)
History of paroxysmal A. fib currently in sinus rhythm on low-dose amiodarone and Eliquis.  She does complain of nausea which may be a side effect of her amiodarone which I will discontinue.

## 2018-02-25 NOTE — Progress Notes (Signed)
02/25/2018 [name]   1934-11-23  [id number]  Primary Physician Maurice Small, MD Primary Cardiologist: Lorretta Harp MD Lupe Carney, [place]  HPI:  Danielle Hebert is a 83 y.o. who presents today for hospital follow-up.  I initially saw her during her index hospitalization 12/31/2017 when she came in with altered mental status after being found down for 2 days at home she did have rhabdomyolysis, elevated grade troponins which that I did not think were related to ACS and A. fib with RVR.  She had normal LV function.  She was ultimately placed on Eliquis and amiodarone converted to sinus rhythm.  A 2D echo was normal.  She was in sinus rhythm on 01/22/2018 when she saw Kerin Ransom in the office.  Her major complaint is of profound nausea and and shaking.  Denies chest pain or shortness of breath.   Current Meds  Medication Sig  . ALPRAZolam (XANAX) 0.5 MG tablet Take 0.5 mg by mouth 2 (two) times daily.  Marland Kitchen amiodarone (PACERONE) 200 MG tablet Take 0.5 tablets (100 mg total) by mouth daily.  Marland Kitchen apixaban (ELIQUIS) 5 MG TABS tablet Take 1 tablet (5 mg total) by mouth 2 (two) times daily.  . cetirizine (ZYRTEC) 10 MG tablet Take 10 mg by mouth daily as needed.  . cholecalciferol (VITAMIN D) 1000 UNITS tablet Take 1,000 Units by mouth daily.  . cycloSPORINE (RESTASIS MULTIDOSE) 0.05 % ophthalmic emulsion Place 1 drop into both eyes 2 (two) times daily.  Marland Kitchen dicyclomine (BENTYL) 20 MG tablet Take 20 mg by mouth 4 days.  . fluticasone (FLONASE) 50 MCG/ACT nasal spray Place 1 spray into both nostrils daily.  . folic acid (FOLVITE) 1 MG tablet Take 1 mg by mouth daily.  . hydrochlorothiazide (HYDRODIURIL) 25 MG tablet Take 1 tablet (25 mg total) by mouth daily.  . methotrexate 2.5 MG tablet Take 12.5 mg by mouth once a week.  . metroNIDAZOLE (METROCREAM) 0.75 % cream Apply 1 application topically 2 (two) times daily. Apply cream to affected area  . Omega-3 Fatty Acids (FISH OIL)  1200 MG CAPS Take by mouth.  Marland Kitchen omeprazole (PRILOSEC) 40 MG capsule Take 40 mg by mouth daily.     Allergies  Allergen Reactions  . Ciprofloxacin   . Remicade [Infliximab]     Social History   Socioeconomic History  . Marital status: Married    Spouse name: Not on file  . Number of children: Not on file  . Years of education: Not on file  . Highest education level: Not on file  Occupational History  . Not on file  Social Needs  . Financial resource strain: Not on file  . Food insecurity:    Worry: Not on file    Inability: Not on file  . Transportation needs:    Medical: Not on file    Non-medical: Not on file  Tobacco Use  . Smoking status: Never Smoker  . Smokeless tobacco: Never Used  Substance and Sexual Activity  . Alcohol use: No  . Drug use: No  . Sexual activity: Not on file  Lifestyle  . Physical activity:    Days per week: Not on file    Minutes per session: Not on file  . Stress: Not on file  Relationships  . Social connections:    Talks on phone: Not on file    Gets together: Not on file    Attends religious service: Not on file    Active member  of club or organization: Not on file    Attends meetings of clubs or organizations: Not on file    Relationship status: Not on file  . Intimate partner violence:    Fear of current or ex partner: Not on file    Emotionally abused: Not on file    Physically abused: Not on file    Forced sexual activity: Not on file  Other Topics Concern  . Not on file  Social History Narrative  . Not on file     Review of Systems: General: negative for chills, fever, night sweats or weight changes.  Cardiovascular: negative for chest pain, dyspnea on exertion, edema, orthopnea, palpitations, paroxysmal nocturnal dyspnea or shortness of breath Dermatological: negative for rash Respiratory: negative for cough or wheezing Urologic: negative for hematuria Abdominal: negative for nausea, vomiting, diarrhea, bright red  blood per rectum, melena, or hematemesis Neurologic: negative for visual changes, syncope, or dizziness All other systems reviewed and are otherwise negative except as noted above.    Blood pressure (!) 147/78, pulse 80.  General appearance: alert and no distress Neck: no adenopathy, no carotid bruit, no JVD, supple, symmetrical, trachea midline and thyroid not enlarged, symmetric, no tenderness/mass/nodules Lungs: clear to auscultation bilaterally Heart: regular rate and rhythm, S1, S2 normal, no murmur, click, rub or gallop Extremities: extremities normal, atraumatic, no cyanosis or edema Pulses: 2+ and symmetric Skin: Skin color, texture, turgor normal. No rashes or lesions Neurologic: Alert and oriented X 3, normal strength and tone. Normal symmetric reflexes. Normal coordination and gait  EKG normal sinus rhythm at 77 with nonspecific ST and T wave changes and small inferior Q waves.  Personally reviewed this EKG.  ASSESSMENT AND PLAN:   Hyperlipidemia History of hyperlipidemia not on statin therapy lipid profile performed 12/31/2017 revealing total cholesterol of 186, LDL 103 and HDL of 69.  Paroxysmal atrial fibrillation (HCC) History of paroxysmal A. fib currently in sinus rhythm on low-dose amiodarone and Eliquis.  She does complain of nausea which may be a side effect of her amiodarone which I will discontinue.      Lorretta Harp MD FACP,FACC,FAHA, [facility] 02/25/2018 4:38 PM

## 2018-03-02 DIAGNOSIS — Z79899 Other long term (current) drug therapy: Secondary | ICD-10-CM | POA: Diagnosis not present

## 2018-03-02 LAB — COMPREHENSIVE METABOLIC PANEL
ALBUMIN: 4 g/dL (ref 3.5–4.7)
ALT: 7 IU/L (ref 0–32)
AST: 11 IU/L (ref 0–40)
Albumin/Globulin Ratio: 1.5 (ref 1.2–2.2)
Alkaline Phosphatase: 62 IU/L (ref 39–117)
BILIRUBIN TOTAL: 0.3 mg/dL (ref 0.0–1.2)
BUN / CREAT RATIO: 10 — AB (ref 12–28)
BUN: 13 mg/dL (ref 8–27)
CALCIUM: 9.7 mg/dL (ref 8.7–10.3)
CO2: 25 mmol/L (ref 20–29)
Chloride: 88 mmol/L — ABNORMAL LOW (ref 96–106)
Creatinine, Ser: 1.3 mg/dL — ABNORMAL HIGH (ref 0.57–1.00)
GFR, EST AFRICAN AMERICAN: 44 mL/min/{1.73_m2} — AB (ref >59)
GFR, EST NON AFRICAN AMERICAN: 39 mL/min/{1.73_m2} — AB (ref >59)
GLUCOSE: 100 mg/dL — AB (ref 65–99)
Globulin, Total: 2.6 g/dL (ref 1.5–4.5)
Potassium: 4.4 mmol/L (ref 3.5–5.2)
Sodium: 131 mmol/L — ABNORMAL LOW (ref 134–144)
TOTAL PROTEIN: 6.6 g/dL (ref 6.0–8.5)

## 2018-03-02 LAB — HEPATIC FUNCTION PANEL: BILIRUBIN, DIRECT: 0.1 mg/dL (ref 0.00–0.40)

## 2018-03-04 ENCOUNTER — Other Ambulatory Visit: Payer: Self-pay

## 2018-03-04 ENCOUNTER — Telehealth: Payer: Self-pay | Admitting: Cardiovascular Disease

## 2018-03-04 MED ORDER — APIXABAN 5 MG PO TABS: 5.0000 mg | ORAL_TABLET | Freq: Two times a day (BID) | ORAL | 0 refills | Status: DC

## 2018-03-04 MED ORDER — APIXABAN 5 MG PO TABS: 5.0000 mg | ORAL_TABLET | Freq: Two times a day (BID) | ORAL | 3 refills | Status: DC

## 2018-03-04 NOTE — Telephone Encounter (Signed)
Pt calling requesting a refill on Eliquis sent to [facility] on [address]. Please address

## 2018-03-04 NOTE — Telephone Encounter (Signed)
Patient is out of medication   *STAT* If patient is at the pharmacy, call can be transferred to refill team.   1. Which medications need to be refilled? (please list name of each medication and dose if known)   apixaban (ELIQUIS) 5 MG TABS tablet  2. Which pharmacy/location (including street and city if local pharmacy) is medication to be sent to?  [address], [place]  3. Do they need a 30 day or 90 day supply?   30 days   Patient is out of medication

## 2018-03-09 ENCOUNTER — Telehealth: Payer: Self-pay | Admitting: Cardiovascular Disease

## 2018-03-09 MED ORDER — RIVAROXABAN 20 MG PO TABS: 20.0000 mg | ORAL_TABLET | Freq: Every day | ORAL | 6 refills | Status: DC

## 2018-03-09 NOTE — Telephone Encounter (Signed)
Let's have her try a sample of Xarelto 20 mg once daily.  She should take the first dose 12 hours after her last dose of Eliquis.

## 2018-03-09 NOTE — Telephone Encounter (Signed)
Spoke with pt, she developed a rash a couple days ago. It is located on the front and back of her neck, up to her hairline and down her back some. It is itchy and she has put cortisone cream on it. The amiodarone was stopped and she feels this is related to the eliquis. She is also concerned about not being on anything to control her heart rate now that the amiodarone is gone. Her bp has been 125/79 pulse 85 and 125/88 78. She has no palpitations or symptoms related to atrial fib at this time. Will forward to the pharm md to help with dosing of xarelto.

## 2018-03-09 NOTE — Telephone Encounter (Signed)
Spoke with pt, new script for xarelto sent to pharmacy. She is aware to take it 12 hours after the last dose of eliquis and also aware to take with food. She will call with continued problems.

## 2018-03-09 NOTE — Telephone Encounter (Signed)
New Message:    Patient calling concerning a rash that has broke her neck out. Patient also states it could be some medication.

## 2018-03-10 ENCOUNTER — Ambulatory Visit: Payer: Medicare Other | Admitting: Cardiovascular Disease

## 2018-03-12 ENCOUNTER — Telehealth: Payer: Self-pay | Admitting: Cardiovascular Disease

## 2018-03-12 NOTE — Telephone Encounter (Signed)
Pt c/o medication issue:  1. Name of Medication: rivaroxaban (XARELTO) 20 MG TABS tablet  2. How are you currently taking this medication (dosage and times per day)?  3. Are you having a reaction (difficulty breathing--STAT)? no  4. What is your medication issue?  Patient's medication was just changed from Eliquis to Xarelto, has been taking it for three days.  She states that she is dizzy and has an upset stomach.  She is wondering if it's this medication or the Eliquis that is making her feel this way. Or can it still be the amiodarone, she hasn't taken it for three weeks.  She also wants to know what she can take for her upset stomach that won't interfere with her medications.

## 2018-03-12 NOTE — Telephone Encounter (Signed)
Returned call to patient-patient states she has been having stomach pains and nausea for a couple of days, denies diarrhea or vomiting.   She is concerned about this and is unsure if the Xarelto or amiodarone (which was stopped 12/11) is causing this.   She states she has tried ginger ale, gatorade, peptobismol, eating bland foods, gas-x.  She takes her Xarelto with dinner.   She has taken 3 doses of Xarelto.    She is wondering what else she can take for her stomach ache that will not interact with her medications.  Advised I am unsure if this is related to her medications as this has been ongoing, patient request to discuss with pharmacist to see what they recommend to take.  Phone note 12/9 and 12/12 c/o nausea as well.  OV 12/11 patient was complaining of nausea and shakiness-amiodarone was stopped, labs completed and were okay other than mild renal insufficiency.     Advised patient she may need to follow up with PCP as this seems to be an ongoing issue.  Asked patient if she is taking Bentyl-she is not sure what this is and is not taking it, she is unsure who prescribed this.  Patient continues to ask if this is due to amiodarone or xarelto and what she can take for her stomach pain other than recommendations given.    Routed to pharmD per pt request

## 2018-03-12 NOTE — Telephone Encounter (Signed)
Xarelto can occasionally cause some dizziness.  However if she cannot tolerate this, she will have to go to warfarin.  Be sure she is taking it with plenty of food (meal not just snack) and if she is willing to continue for another week to see if the dizziness goes away.  For the upset stomach I usually start with ginger ale as the best cure, doesn't interact with any medications.

## 2018-03-13 NOTE — Telephone Encounter (Signed)
Returned call to patient, she is aware of recommendations and verbalized understanding.  She states she does not want to go back on warfarin.  She would like to give this more time and will follow up with PCP as well.

## 2018-03-13 NOTE — Telephone Encounter (Signed)
Noted patient not on amiodarone , has on going problems with stomach pain, and takes long-term omeprazole. Her problems needs full assessment by PCP or gastroenterologist.   Her stomach pain may not be related to her cardiac medication , but to long-term use of methotrexate , other medication, or stomach "bug".  Please avoid (coffee, fried foods, spicy foods, and sauces) for now, take omeprazole twice daily (morning and evening) for 3 days, and follow up with PCP or gastroenterologist as soon as possible.  We can see her on Monday to transition  back to warfarin as well.

## 2018-03-16 ENCOUNTER — Telehealth: Payer: Self-pay | Admitting: Cardiovascular Disease

## 2018-03-16 DIAGNOSIS — R110 Nausea: Secondary | ICD-10-CM | POA: Diagnosis not present

## 2018-03-16 DIAGNOSIS — L309 Dermatitis, unspecified: Secondary | ICD-10-CM | POA: Diagnosis not present

## 2018-03-16 NOTE — Telephone Encounter (Signed)
Agree that patient needs to see PCP for evaluation

## 2018-03-16 NOTE — Telephone Encounter (Signed)
New Message   Pt c/o medication issue:  1. Name of Medication: rivaroxaban (XARELTO) 20 MG TABS   2. How are you currently taking this medication (dosage and times per day)? 1x daily  3. Are you having a reaction (difficulty breathing--STAT)? No  4. What is your medication issue? Pt is itching very badly and has a rash on neck, back, chest and arms

## 2018-03-16 NOTE — Telephone Encounter (Signed)
Patient calling back- stating rash has spread to neck , back chest and and torso. She states it is itching some redden bumps ,not painful some raised She states hse has not use any new  Lotions or detergents as well.    Recommended PATIENT TO CONTACT PRIMARY to have  Someone visual see the affected areas and will defer to  CVRR.  Patient thinks it is the Xarelto, which was recently started in place of Eliquis for the same issue

## 2018-03-16 NOTE — Telephone Encounter (Signed)
Spoke to patient. She is aware to keep appointment with primary today at 2 pm, if medication needs to be change  Contact office

## 2018-03-25 DIAGNOSIS — I4891 Unspecified atrial fibrillation: Secondary | ICD-10-CM | POA: Diagnosis not present

## 2018-03-25 DIAGNOSIS — L309 Dermatitis, unspecified: Secondary | ICD-10-CM | POA: Diagnosis not present

## 2018-04-03 DIAGNOSIS — R1084 Generalized abdominal pain: Secondary | ICD-10-CM | POA: Diagnosis not present

## 2018-04-03 DIAGNOSIS — Z7901 Long term (current) use of anticoagulants: Secondary | ICD-10-CM | POA: Diagnosis not present

## 2018-04-04 ENCOUNTER — Emergency Department (HOSPITAL_COMMUNITY): Payer: Medicare Other

## 2018-04-04 ENCOUNTER — Encounter (HOSPITAL_COMMUNITY): Payer: Self-pay | Admitting: *Deleted

## 2018-04-04 ENCOUNTER — Other Ambulatory Visit: Payer: Self-pay

## 2018-04-04 ENCOUNTER — Emergency Department (HOSPITAL_COMMUNITY)
Admission: EM | Admit: 2018-04-04 | Discharge: 2018-04-04 | Disposition: A | Discharge status: Home / Self Care | Payer: Medicare Other | Attending: Emergency Medicine | Admitting: Emergency Medicine

## 2018-04-04 DIAGNOSIS — Z7901 Long term (current) use of anticoagulants: Secondary | ICD-10-CM | POA: Diagnosis not present

## 2018-04-04 DIAGNOSIS — K449 Diaphragmatic hernia without obstruction or gangrene: Secondary | ICD-10-CM | POA: Diagnosis not present

## 2018-04-04 DIAGNOSIS — R531 Weakness: Secondary | ICD-10-CM | POA: Diagnosis not present

## 2018-04-04 DIAGNOSIS — K5730 Diverticulosis of large intestine without perforation or abscess without bleeding: Secondary | ICD-10-CM | POA: Diagnosis not present

## 2018-04-04 DIAGNOSIS — Z79899 Other long term (current) drug therapy: Secondary | ICD-10-CM | POA: Insufficient documentation

## 2018-04-04 DIAGNOSIS — N183 Chronic kidney disease, stage 3 (moderate): Secondary | ICD-10-CM | POA: Insufficient documentation

## 2018-04-04 DIAGNOSIS — R030 Elevated blood-pressure reading, without diagnosis of hypertension: Secondary | ICD-10-CM | POA: Diagnosis not present

## 2018-04-04 DIAGNOSIS — M069 Rheumatoid arthritis, unspecified: Secondary | ICD-10-CM | POA: Insufficient documentation

## 2018-04-04 DIAGNOSIS — E871 Hypo-osmolality and hyponatremia: Secondary | ICD-10-CM | POA: Insufficient documentation

## 2018-04-04 DIAGNOSIS — I4891 Unspecified atrial fibrillation: Secondary | ICD-10-CM | POA: Diagnosis not present

## 2018-04-04 DIAGNOSIS — R1084 Generalized abdominal pain: Secondary | ICD-10-CM | POA: Insufficient documentation

## 2018-04-04 LAB — COMPREHENSIVE METABOLIC PANEL
ALT: 14 U/L (ref 0–44)
AST: 25 U/L (ref 15–41)
Albumin: 3.8 g/dL (ref 3.5–5.0)
Alkaline Phosphatase: 51 U/L (ref 38–126)
Anion gap: 13 (ref 5–15)
BUN: 10 mg/dL (ref 8–23)
CHLORIDE: 83 mmol/L — AB (ref 98–111)
CO2: 27 mmol/L (ref 22–32)
Calcium: 9.2 mg/dL (ref 8.9–10.3)
Creatinine, Ser: 1.2 mg/dL — ABNORMAL HIGH (ref 0.44–1.00)
GFR calc non Af Amer: 42 mL/min — ABNORMAL LOW (ref >60)
GFR, EST AFRICAN AMERICAN: 49 mL/min — AB (ref >60)
Glucose, Bld: 116 mg/dL — ABNORMAL HIGH (ref 70–99)
POTASSIUM: 3 mmol/L — AB (ref 3.5–5.1)
SODIUM: 123 mmol/L — AB (ref 135–145)
Total Bilirubin: 0.4 mg/dL (ref 0.3–1.2)
Total Protein: 6.8 g/dL (ref 6.5–8.1)

## 2018-04-04 LAB — CBC
HEMATOCRIT: 36.3 % (ref 36.0–46.0)
HEMOGLOBIN: 12 g/dL (ref 12.0–15.0)
MCH: 29.6 pg (ref 26.0–34.0)
MCHC: 33.1 g/dL (ref 30.0–36.0)
MCV: 89.4 fL (ref 80.0–100.0)
NRBC: 0 % (ref 0.0–0.2)
Platelets: 330 10*3/uL (ref 150–400)
RBC: 4.06 MIL/uL (ref 3.87–5.11)
RDW: 12.7 % (ref 11.5–15.5)
WBC: 6.5 10*3/uL (ref 4.0–10.5)

## 2018-04-04 LAB — PROTIME-INR
INR: 2.36
Prothrombin Time: 25.5 seconds — ABNORMAL HIGH (ref 11.4–15.2)

## 2018-04-04 LAB — LIPASE, BLOOD: LIPASE: 34 U/L (ref 11–51)

## 2018-04-04 IMAGING — CT CT ABD-PELV W/O
2 of 4 series · 16 of 46 positions shown, 18 images · non-contrast
Comparison: [DATE]

CLINICAL DATA: Abdominal pain

EXAM:
CT ABDOMEN AND PELVIS WITHOUT CONTRAST
TECHNIQUE: Multidetector CT imaging of the abdomen and pelvis was performed
following the standard protocol without IV contrast.

[Series 3: ap without · axial · non-contrast · 0.71mm/px · z∈[+913,+1273]mm · 13 of 82 slices shown, 15 images]
[im 5/82  soft-tissue]
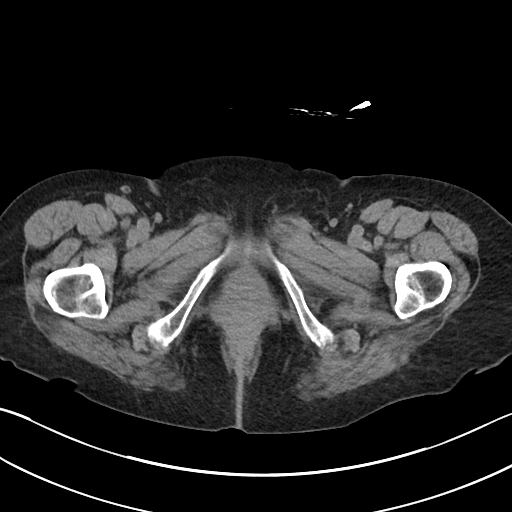
[im 5/82  bone]
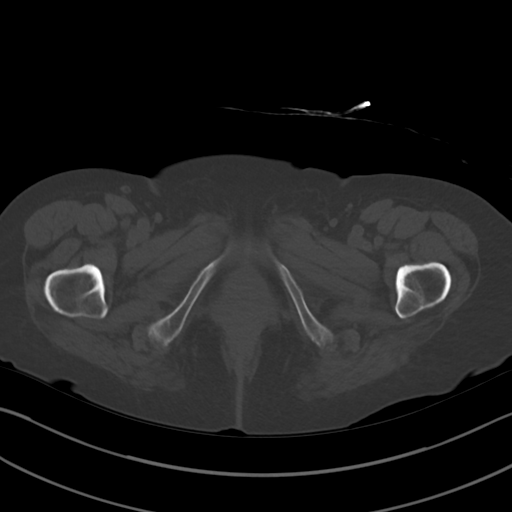
[im 10/82  soft-tissue]
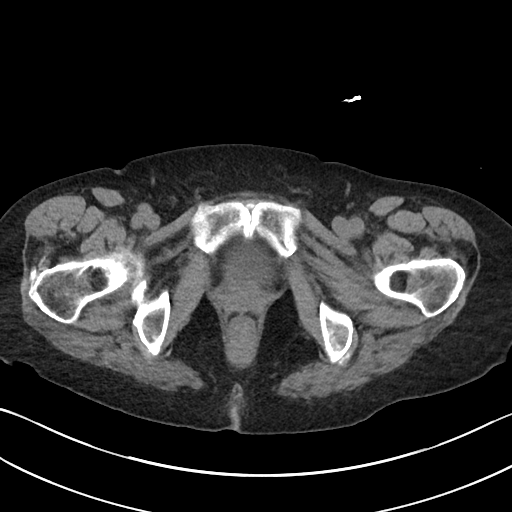
[im 19/82  soft-tissue]
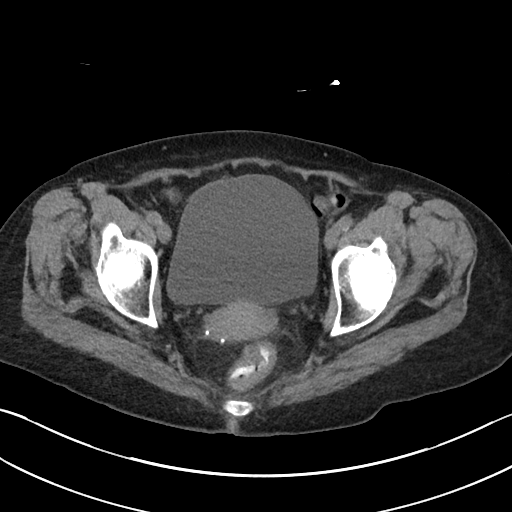
[im 23/82  soft-tissue]
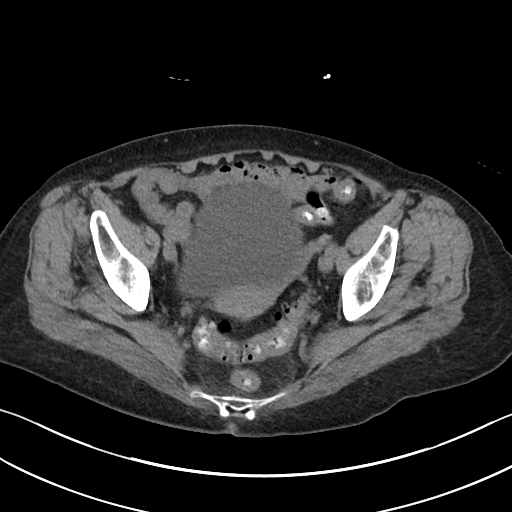
[im 28/82  soft-tissue]
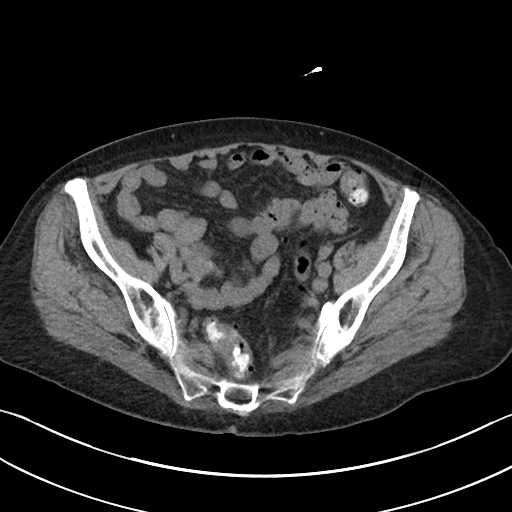
[im 37/82  soft-tissue]
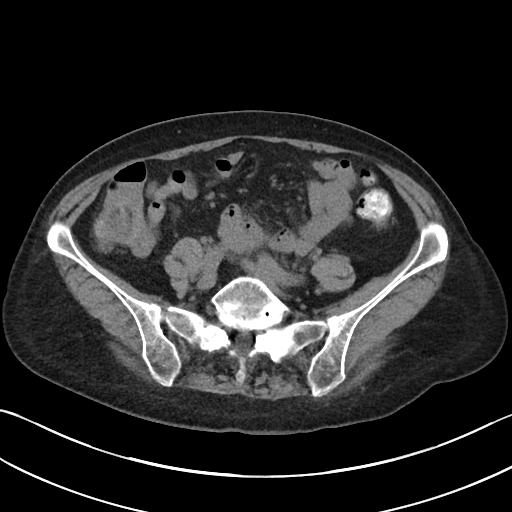
[im 41/82  soft-tissue]
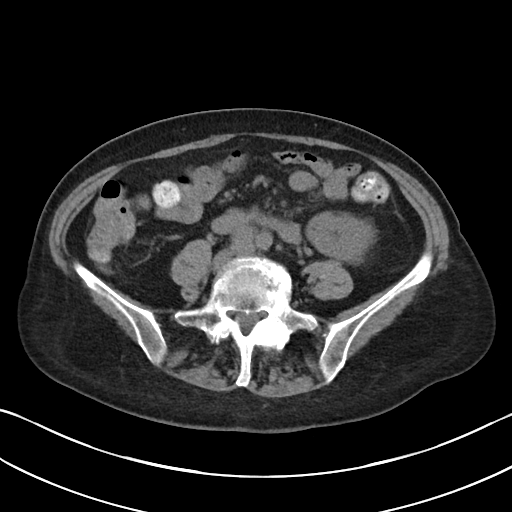
[im 46/82  soft-tissue]
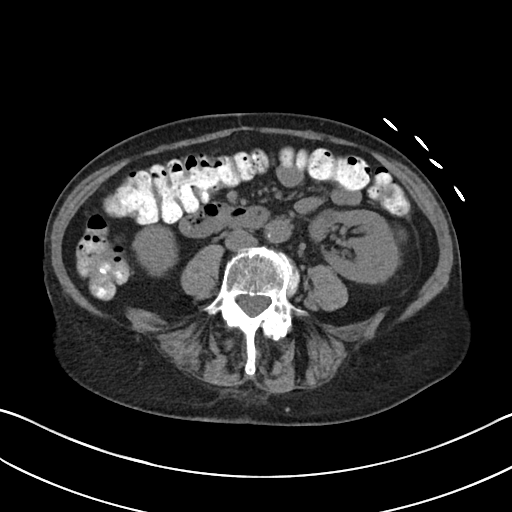
[im 55/82  soft-tissue]
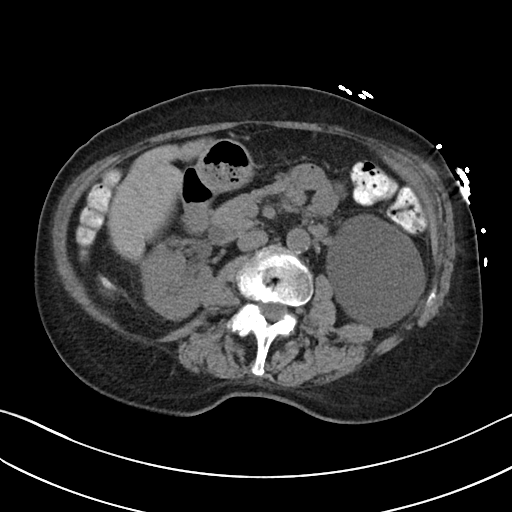
[im 55/82  bone]
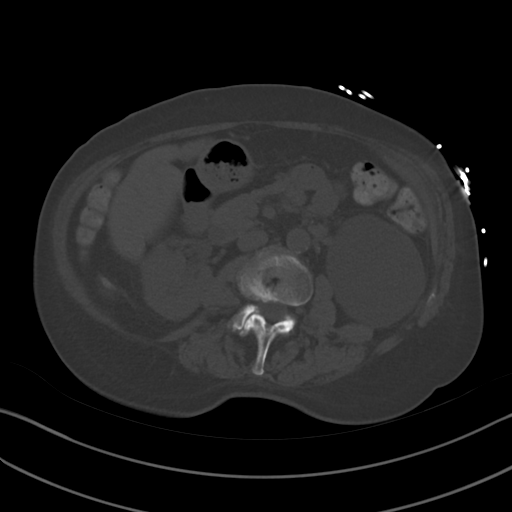
[im 59/82  soft-tissue]
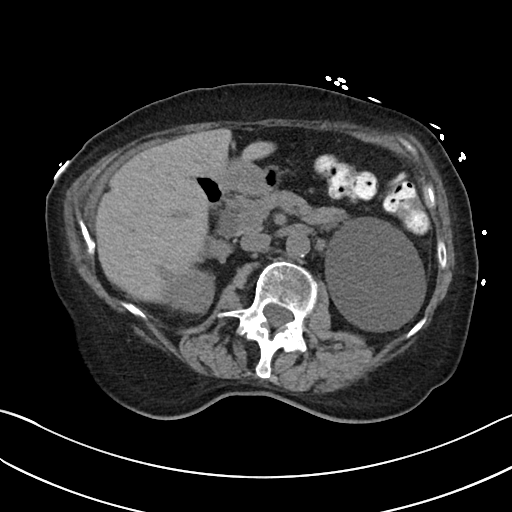
[im 64/82  soft-tissue]
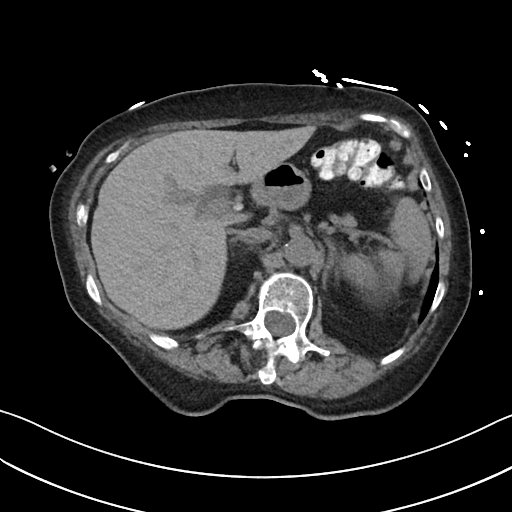
[im 73/82  soft-tissue]
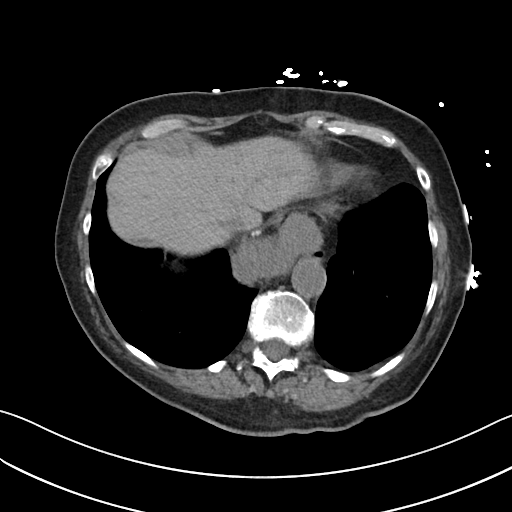
[im 77/82  soft-tissue]
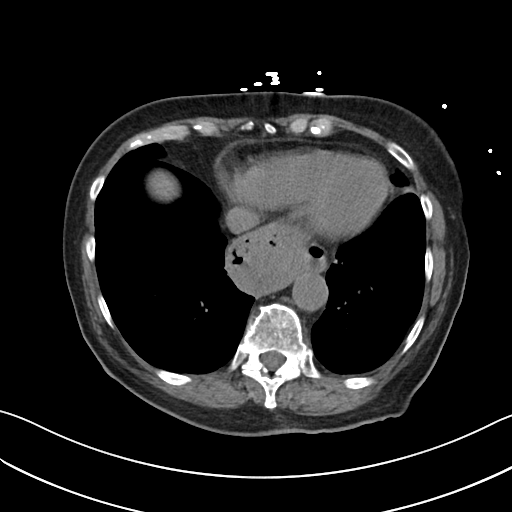

[Series 6: cor · coronal · 0.64mm/px · 3 of 73 slices shown]
[im 25/73  soft-tissue]
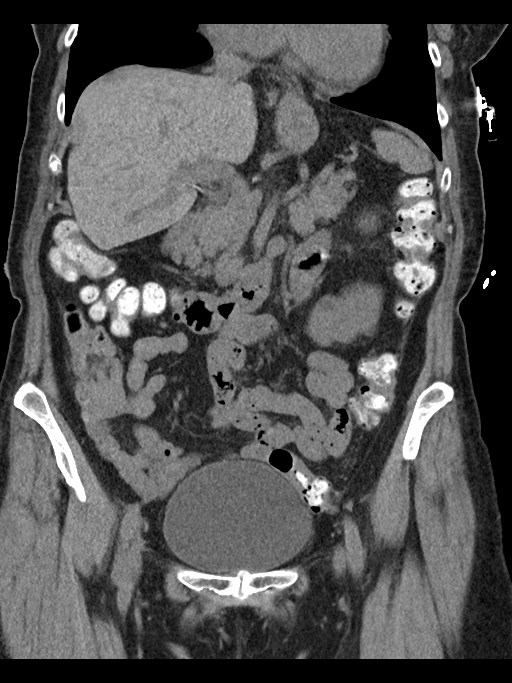
[im 33/73  soft-tissue]
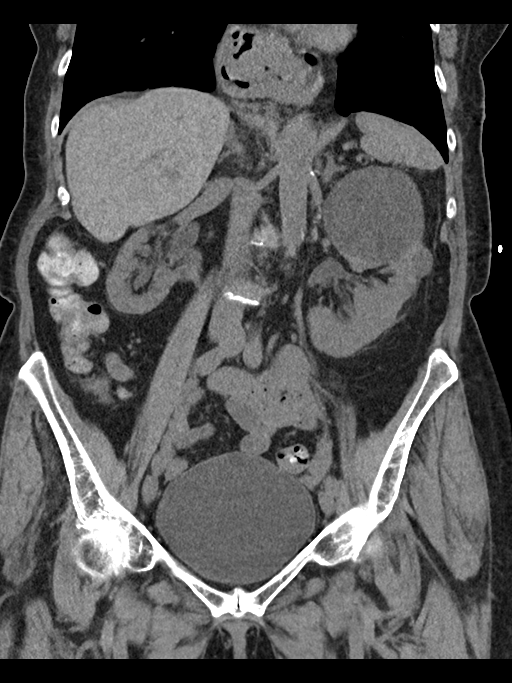
[im 41/73  soft-tissue]
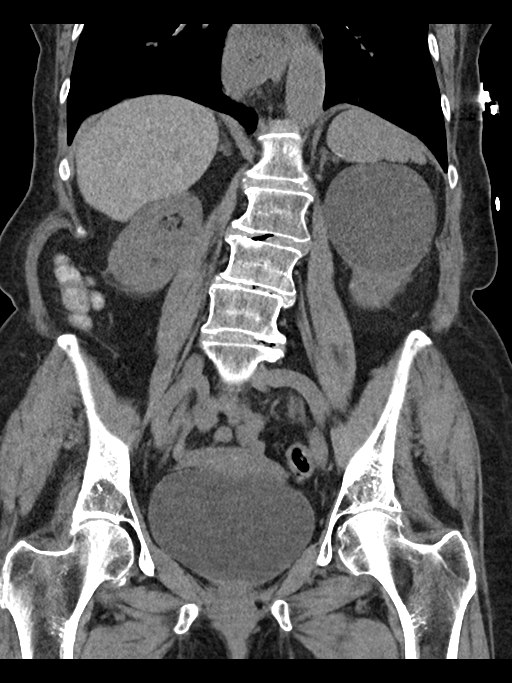

[16 of 46 positions shown; findings below may reference images not displayed]

FINDINGS: Lower chest: No acute abnormality.

Hepatobiliary: No focal liver abnormality is seen. Status post
cholecystectomy. No biliary dilatation.

Pancreas: Unremarkable. No pancreatic ductal dilatation or
surrounding inflammatory changes.

Spleen: Normal in size without focal abnormality.

Adrenals/Urinary Tract: Normal adrenal glands. Normal right kidney.
8.5 cm hypodense, fluid attenuating left renal mass consistent with
a cyst. Multiple other smaller left renal cysts are also present
unchanged from the prior exam. No urolithiasis or obstructive
uropathy. Normal bladder.

Stomach/Bowel: Moderate hiatal hernia. Appendix appears normal. No
evidence of bowel wall thickening, distention, or inflammatory
changes.

Vascular/Lymphatic: Aortic atherosclerosis. No enlarged abdominal or
pelvic lymph nodes.

Reproductive: Uterus and bilateral adnexa are unremarkable.

Other: No fluid collection or hematoma.  No abdominal hernia.

Musculoskeletal: No acute osseous abnormality. No aggressive osseous
lesion. Degenerative disc disease with disc height loss at L2-3,
L3-4, L4-5 and L5-S1 with bilateral facet arthropathy.
IMPRESSION: 1. No acute abdominal or pelvic pathology.
2. Multiple left renal cysts.
3. Moderate size hiatal hernia.
4. Sigmoid diverticulosis without evidence of diverticulitis.

## 2018-04-04 MED ORDER — SODIUM CHLORIDE 0.9 % IV BOLUS
1000.0000 mL | Freq: Once | INTRAVENOUS | Status: AC
  Administered 2018-04-04: 1000 mL via INTRAVENOUS

## 2018-04-04 MED ORDER — SODIUM CHLORIDE 0.9% FLUSH: 3.0000 mL | Freq: Once | INTRAVENOUS | Status: DC

## 2018-04-04 MED ORDER — PROMETHAZINE HCL 25 MG PO TABS: 25.0000 mg | ORAL_TABLET | Freq: Four times a day (QID) | ORAL | 1 refills | Status: DC | PRN

## 2018-04-04 NOTE — ED Triage Notes (Signed)
Pt went to [facility] clinic d/t constipation/diarrhea, nausea and dizziness yesterday.  She was instructed to come to the ED if she did not feel better d/t hypoNa+.  Pt's son is at bedside.  Pt is A&O x 4.

## 2018-04-04 NOTE — ED Provider Notes (Signed)
[facility] EMERGENCY DEPARTMENT Provider Note   CSN: [id number] Arrival date & time: 04/04/18  1207     History   Chief Complaint Chief Complaint  Patient presents with  . Low Sodium    HPI Danielle Hebert is a 84 y.o. female.  Patient is an 84 year old female with past medical history of rheumatoid arthritis, paroxysmal A. fib, chronic renal insufficiency.  She presents today with complaints of weakness.  Patient was admitted to the hospital in October for some sort of unresponsive episode.  She states she was on the floor for 2 days for unknown reasons and the patient has no recollection of this event.  Since that time, she states that she has not felt well.  She describes intermittent constipation alternating with diarrhea.  All has been nonbloody.  She is also had crampy abdominal pain, progressive weakness, and generalized malaise.  She was seen yesterday at a walk-in clinic.  She had laboratory studies obtained.  The clinic called her last night to tell her that she had a critically low sodium level and that she should come to the ER if she begins feeling worse.  She states she has been drinking Gatorade, however this has not helped.  She denies any fevers or chills.  She denies any chest pain or difficulty breathing.  The history is provided by the patient.    Past Medical History:  Diagnosis Date  . Hyperlipidemia   . Rheumatoid arthritis ([place])   . Rheumatoid arthritis [facility])     Patient Active Problem List   Diagnosis Date Noted  . Acute renal insufficiency 01/22/2018  . Diastolic dysfunction 57/32/2025  . Anticoagulated 01/22/2018  . Chronic anemia 01/22/2018  . Paroxysmal atrial fibrillation (HCC)   . CKD (chronic kidney disease), stage III ([place]) 12/31/2017  . Fall 12/30/2017  . Elevated troponin 12/30/2017  . Abnormal LFTs 12/30/2017  . Rhabdomyolysis 12/30/2017  . Leukocytosis 12/30/2017  . Acute metabolic encephalopathy 42/70/6237  .  Hyperlipidemia   . Rheumatoid arthritis [facility])     Past Surgical History:  Procedure Laterality Date  . CHOLECYSTECTOMY       OB History   No obstetric history on file.      Home Medications    Prior to Admission medications   Medication Sig Start Date End Date Taking? Authorizing Provider  ALPRAZolam Duanne Moron) 0.5 MG tablet Take 0.5 mg by mouth 2 (two) times daily. 12/04/17   [provider]  cetirizine (ZYRTEC) 10 MG tablet Take 10 mg by mouth daily as needed. 12/04/17   [provider]  cholecalciferol (VITAMIN D) 1000 UNITS tablet Take 1,000 Units by mouth daily.    [provider]  cycloSPORINE (RESTASIS MULTIDOSE) 0.05 % ophthalmic emulsion Place 1 drop into both eyes 2 (two) times daily.    [provider]  dicyclomine (BENTYL) 20 MG tablet Take 20 mg by mouth 4 days.    [provider]  fluticasone (FLONASE) 50 MCG/ACT nasal spray Place 1 spray into both nostrils daily. 12/06/17   [provider]  folic acid (FOLVITE) 1 MG tablet Take 1 mg by mouth daily. 10/04/17   [provider]  hydrochlorothiazide (HYDRODIURIL) 25 MG tablet Take 1 tablet (25 mg total) by mouth daily. 01/26/18   Lorretta Harp, MD  methotrexate 2.5 MG tablet Take 12.5 mg by mouth once a week. 12/22/17   [provider]  metroNIDAZOLE (METROCREAM) 0.75 % cream Apply 1 application topically 2 (two) times daily. Apply cream  to affected area 12/22/17   [provider]  Omega-3 Fatty Acids (FISH OIL) 1200 MG CAPS Take by mouth.    [provider]  omeprazole (PRILOSEC) 40 MG capsule Take 40 mg by mouth daily. 12/21/17   [provider]  rivaroxaban (XARELTO) 20 MG TABS tablet Take 1 tablet (20 mg total) by mouth daily with supper. 03/09/18   Lorretta Harp, MD    Family History Family History  Problem Relation Age of Onset  . Cancer Mother        lung  . Cancer Sister        breast    Social History Social  History   Tobacco Use  . Smoking status: Never Smoker  . Smokeless tobacco: Never Used  Substance Use Topics  . Alcohol use: No  . Drug use: No     Allergies   Ciprofloxacin and Remicade [infliximab]   Review of Systems Review of Systems  All other systems reviewed and are negative.    Physical Exam Updated Vital Signs BP (!) 156/80 (BP Location: Right Arm)   Pulse 77   Temp 97.7 F (36.5 C) (Oral)   Resp 16   SpO2 99%   Physical Exam Vitals signs and nursing note reviewed.  Constitutional:      General: She is not in acute distress.    Appearance: She is well-developed. She is not diaphoretic.  HENT:     Head: Normocephalic and atraumatic.     Mouth/Throat:     Mouth: Mucous membranes are moist.  Eyes:     Extraocular Movements: Extraocular movements intact.     Pupils: Pupils are equal, round, and reactive to light.  Neck:     Musculoskeletal: Normal range of motion and neck supple.  Cardiovascular:     Rate and Rhythm: Normal rate and regular rhythm.     Heart sounds: No murmur. No friction rub. No gallop.   Pulmonary:     Effort: Pulmonary effort is normal. No respiratory distress.     Breath sounds: Normal breath sounds. No wheezing.  Abdominal:     General: Bowel sounds are normal. There is no distension.     Palpations: Abdomen is soft.     Tenderness: There is no abdominal tenderness.  Musculoskeletal: Normal range of motion.  Skin:    General: Skin is warm and dry.  Neurological:     General: No focal deficit present.     Mental Status: She is alert and oriented to person, place, and time.     Cranial Nerves: No cranial nerve deficit.     Sensory: No sensory deficit.     Coordination: Coordination normal.      ED Treatments / Results  Labs (all labs ordered are listed, but only abnormal results are displayed) Labs Reviewed  LIPASE, BLOOD  COMPREHENSIVE METABOLIC PANEL  CBC  URINALYSIS, ROUTINE W REFLEX MICROSCOPIC     EKG None  Radiology No results found.  Procedures Procedures (including critical care time)  Medications Ordered in ED Medications  sodium chloride flush (NS) 0.9 % injection 3 mL (has no administration in time range)  sodium chloride 0.9 % bolus 1,000 mL (has no administration in time range)     Initial Impression / Assessment and Plan / ED Course  I have reviewed the triage vital signs and the nursing notes.  Pertinent labs & imaging results that were available during my care of the patient were reviewed by me and considered in my  medical decision making (see chart for details).  Patient presents here with complaints of weakness and nausea.  He was found to have a low sodium of 121 yesterday with today's repeat at 123.  This is after she had drank Gatorade yesterday evening.  Patient was hydrated here with normal saline.  Remainder of the work-up shows no other obvious abnormality.  I did obtain a CT scan of the abdomen which showed no acute intra-abdominal process.  Laboratory studies are otherwise reassuring.  I suspect the hyponatremia is the source of the patient's weakness.  She does report she has been drinking an increased amount of water recently for constipation.  I have advised her to cut back on this.  I see no indication for admission.  The patient appears clinically well and will follow up with her primary doctor in 2 to 3 days for a repeat electrolyte panel.  Final Clinical Impressions(s) / ED Diagnoses   Final diagnoses:  None    ED Discharge Orders    None       Veryl Speak, MD 04/04/18 1437

## 2018-04-04 NOTE — ED Notes (Signed)
Patient transported to CT 

## 2018-04-04 NOTE — Discharge Instructions (Addendum)
Reduce your free water intake by half for the next several days.  Phenergan as prescribed as needed for nausea.  Follow-up with your primary doctor in 2 to 3 days for a recheck of your electrolyte panel.  Return to the emergency department in the meantime if symptoms significantly worsen or change.

## 2018-04-04 NOTE — ED Notes (Addendum)
Pt ambulated to the BR with steady gait.  Son remains at bedside.

## 2018-04-07 ENCOUNTER — Ambulatory Visit: Payer: Medicare Other | Admitting: Cardiovascular Disease

## 2018-04-07 DIAGNOSIS — Z5181 Encounter for therapeutic drug level monitoring: Secondary | ICD-10-CM | POA: Diagnosis not present

## 2018-04-07 DIAGNOSIS — Z7901 Long term (current) use of anticoagulants: Secondary | ICD-10-CM | POA: Diagnosis not present

## 2018-04-07 DIAGNOSIS — K581 Irritable bowel syndrome with constipation: Secondary | ICD-10-CM | POA: Diagnosis not present

## 2018-04-08 ENCOUNTER — Encounter: Payer: Self-pay | Admitting: Neurology

## 2018-04-13 ENCOUNTER — Ambulatory Visit (INDEPENDENT_AMBULATORY_CARE_PROVIDER_SITE_OTHER): Payer: Medicare Other | Admitting: Cardiology

## 2018-04-13 ENCOUNTER — Encounter: Payer: Self-pay | Admitting: Cardiology

## 2018-04-13 DIAGNOSIS — W19XXXD Unspecified fall, subsequent encounter: Secondary | ICD-10-CM | POA: Diagnosis not present

## 2018-04-13 DIAGNOSIS — E871 Hypo-osmolality and hyponatremia: Secondary | ICD-10-CM

## 2018-04-13 DIAGNOSIS — I5189 Other ill-defined heart diseases: Secondary | ICD-10-CM

## 2018-04-13 DIAGNOSIS — Z7901 Long term (current) use of anticoagulants: Secondary | ICD-10-CM | POA: Diagnosis not present

## 2018-04-13 DIAGNOSIS — I10 Essential (primary) hypertension: Secondary | ICD-10-CM | POA: Diagnosis not present

## 2018-04-13 MED ORDER — METOPROLOL SUCCINATE ER 25 MG PO TB24: 25.0000 mg | ORAL_TABLET | Freq: Every day | ORAL | 6 refills | Status: DC

## 2018-04-13 NOTE — Assessment & Plan Note (Signed)
Poor control- repeat by me 152/82.  She tells me her B/P at home runs 735'D systolic.

## 2018-04-13 NOTE — Assessment & Plan Note (Signed)
Possible secondary to overhydration- followed by PCP

## 2018-04-13 NOTE — Assessment & Plan Note (Signed)
Eliquis- CHADS VASC=3-allergic to Eliquis and Xarelto- Warfarin followed by PCP

## 2018-04-13 NOTE — Assessment & Plan Note (Signed)
Grade 2 by echo with EF 55-60% and basilar hypertrophy (LA WNL)

## 2018-04-13 NOTE — Assessment & Plan Note (Signed)
Pt admitted after a fall at home with evidence of Rhabdomyolysis. Fall suspected to be secondary to AF with RVR (new).

## 2018-04-13 NOTE — Progress Notes (Signed)
04/13/2018 Danielle Hebert   05-08-1934  [id number]  Primary Physician Maurice Small, MD Primary Cardiologist: Dr Gwenlyn Found  HPI:  Pleasant 84 y/o female seen today in follow up.  The pt was admitted 12/30/17 after being found down at home.  In speaking with the patient's son he thinks she was down for 48 hours.  On admission she had evidence of rhabdomyolysis with AKI and elevated CK.  Cardiology was consulted for AF which was new for her.  The patient was hydrated and anticoagulated.  Echo showed her EF to be 55-60% with basilar hypertrophy, grade 2 DD, and normal LA size.  Amiodarone was added.  She converted before discharge.  After this she had problems with Amiodarone,nausea and shaking, and eventually this was discontinued. She also had a rash with Eliquis and was changed to Xarelto.  The rash actually got worse and she was eventually changed to Warfarin and is followed by her PCP.   On 04/04/2018 she presented to the ED with hyponatremia- Na+ 123.  She has been on different laxatives and has been followed by her PCP for suspected IBS.  She has followed up with her PCP since discharge and labs were drawn.  From a cardiac standpoint she is stable, no unusual palpitations. Her main complaints are GI and appear to be chronic.    Current Outpatient Medications  Medication Sig Dispense Refill  . ALPRAZolam (XANAX) 0.5 MG tablet Take 0.5 mg by mouth at bedtime.   1  . cetirizine (ZYRTEC) 10 MG tablet Take 10 mg by mouth daily as needed for allergies.   11  . cholecalciferol (VITAMIN D) 1000 UNITS tablet Take 1,000 Units by mouth daily.    . cycloSPORINE (RESTASIS MULTIDOSE) 0.05 % ophthalmic emulsion Place 1 drop into both eyes 2 (two) times daily.    . fluticasone (FLONASE) 50 MCG/ACT nasal spray Place 1 spray into both nostrils daily.  11  . folic acid (FOLVITE) 1 MG tablet Take 1 mg by mouth daily.  0  . hydrochlorothiazide (HYDRODIURIL) 25 MG tablet Take 1 tablet (25 mg total) by mouth  daily. 90 tablet 3  . Krill Oil 1000 MG CAPS Take 1,000 mg by mouth daily.    . Melatonin-Pyridoxine (MELATIN PO) Take 1 tablet by mouth at bedtime.    . methotrexate 2.5 MG tablet Take 12.5 mg by mouth once a week.  0  . metroNIDAZOLE (METROCREAM) 0.75 % cream Apply 1 application topically 2 (two) times daily. Apply cream to affected area  3  . omeprazole (PRILOSEC) 40 MG capsule Take 40 mg by mouth daily.  11  . ondansetron (ZOFRAN) 4 MG tablet Take 4 mg by mouth every 8 (eight) hours as needed.    . warfarin (COUMADIN) 5 MG tablet Take 5 mg by mouth daily.    . metoprolol succinate (TOPROL XL) 25 MG 24 hr tablet Take 1 tablet (25 mg total) by mouth daily. 30 tablet 6   No current facility-administered medications for this visit.     Allergies  Allergen Reactions  . Eliquis [Apixaban] Rash  . Xarelto [Rivaroxaban] Rash  . Ciprofloxacin   . Remicade [Infliximab]   . Amiodarone Nausea Only    Past Medical History:  Diagnosis Date  . Hyperlipidemia   . Rheumatoid arthritis ([place])   . Rheumatoid arthritis ([place])     Social History   Socioeconomic History  . Marital status: Married    Spouse name: Not on file  . Number of children: Not  on file  . Years of education: Not on file  . Highest education level: Not on file  Occupational History  . Not on file  Social Needs  . Financial resource strain: Not on file  . Food insecurity:    Worry: Not on file    Inability: Not on file  . Transportation needs:    Medical: Not on file    Non-medical: Not on file  Tobacco Use  . Smoking status: Never Smoker  . Smokeless tobacco: Never Used  Substance and Sexual Activity  . Alcohol use: No  . Drug use: No  . Sexual activity: Not on file  Lifestyle  . Physical activity:    Days per week: Not on file    Minutes per session: Not on file  . Stress: Not on file  Relationships  . Social connections:    Talks on phone: Not on file    Gets together: Not on file    Attends  religious service: Not on file    Active member of club or organization: Not on file    Attends meetings of clubs or organizations: Not on file    Relationship status: Not on file  . Intimate partner violence:    Fear of current or ex partner: Not on file    Emotionally abused: Not on file    Physically abused: Not on file    Forced sexual activity: Not on file  Other Topics Concern  . Not on file  Social History Narrative  . Not on file     Family History  Problem Relation Age of Onset  . Cancer Mother        lung  . Cancer Sister        breast     Review of Systems: General: negative for chills, fever, night sweats or weight changes.  Cardiovascular: negative for chest pain, dyspnea on exertion, edema, orthopnea, palpitations, paroxysmal nocturnal dyspnea or shortness of breath Dermatological: negative for rash Respiratory: negative for cough or wheezing Urologic: negative for hematuria Abdominal: negative for nausea, vomiting, diarrhea, bright red blood per rectum, melena, or hematemesis chronic abdominal discomfort Neurologic: negative for visual changes, syncope, or dizziness All other systems reviewed and are otherwise negative except as noted above.    Blood pressure (!) 160/88, pulse 86, height 5\' 5"  (1.651 m), weight 147 lb (66.7 kg), SpO2 96 %.  General appearance: alert, cooperative and no distress Lungs: clear to auscultation bilaterally Heart: regular rate and rhythm Extremities: no edema Skin: pale, warm and dry Neurologic: Grossly normal   ASSESSMENT AND PLAN:   Falls, subsequent encounter Pt admitted after a fall at home with evidence of Rhabdomyolysis. Fall suspected to be secondary to AF with RVR (new).  Anticoagulated Eliquis- CHADS VASC=3-allergic to Eliquis and Xarelto- Warfarin followed by PCP  Diastolic dysfunction Grade 2 by echo with EF 55-60% and basilar hypertrophy (LA WNL)  Essential hypertension Poor control- repeat by me 152/82.   She tells me her B/P at home runs 620'B systolic.   Hyponatremia Possible secondary to overhydration- followed by PCP   PLAN  Add Toprol 25 mg daily.  Keep follow up with Dr Gwenlyn Found as scheduled in March.  She'll monitor her B/P at home and bring this with her. I told her we would defer to Dr Justin Mend her issues of hyponatremia, abdominal pain, and her INR.   Kerin Ransom PA-C 04/13/2018 4:09 PM

## 2018-04-13 NOTE — Patient Instructions (Signed)
Medication Instructions:  START Metoprolol Succinate 25mg  Take 1 tablet once a day  If you need a refill on your cardiac medications before your next appointment, please call your pharmacy.   Lab work: None  If you have labs (blood work) drawn today and your tests are completely normal, you will receive your results only by: Marland Kitchen MyChart Message (if you have MyChart) OR . A paper copy in the mail If you have any lab test that is abnormal or we need to change your treatment, we will call you to review the results.  Testing/Procedures: None   Follow-Up: At [facility], you and your health needs are our priority.  As part of our continuing mission to provide you with exceptional heart care, we have created designated Provider Care Teams.  These Care Teams include your primary Cardiologist (physician) and Advanced Practice Providers (APPs -  Physician Assistants and Nurse Practitioners) who all work together to provide you with the care you need, when you need it. Marland Kitchen LUKE RECOMMENDS YOU FOLLOW UP AS SCHEDULED WITH DR BERRY.  Any Other Special Instructions Will Be Listed Below (If Applicable).

## 2018-04-17 ENCOUNTER — Telehealth: Payer: Self-pay | Admitting: Cardiology

## 2018-04-17 NOTE — Telephone Encounter (Signed)
Received page from patient.  She took her first dose of metoprolol today and has felt quite lightheaded.  Reassuringly, her blood pressure is actually elevated in the 180s over 100s and her heart rate was in the 80s on home monitor.  She denies any chest pain, shortness of breath, or frank syncopal episodes. If she were to have any of the aforementioned warning signs I advised her to present to the emergency department, and she agrees with this.  In the interim, she will continue taking her usual medications, which include hydrochlorothiazide.  I suspect that her blood pressure is somewhat elevated due to anxiety this evening.  She prefers not to take any further doses of metoprolol for now, she will readdress this with the office during business hours next week.  Doylene Canning, MD

## 2018-04-20 ENCOUNTER — Telehealth: Payer: Self-pay | Admitting: Cardiovascular Disease

## 2018-04-20 DIAGNOSIS — Z5181 Encounter for therapeutic drug level monitoring: Secondary | ICD-10-CM | POA: Diagnosis not present

## 2018-04-20 NOTE — Telephone Encounter (Signed)
Called patient, advised of pharmD note. Patient verbalized understanding. Made appointment with Kerin Ransom, PA-C on Thursday to be evaluated with BP.

## 2018-04-20 NOTE — Telephone Encounter (Signed)
Noted recent hospital admission after fall provoked by AF with RVR. Patient needs medication to managem BP and help with Afib.  Due to multiple GI issues related to medication and severe reaction to metoprolol I recommend :   -discontinue metoprolol  -monitor BP and HR daily and keep records of readings  -schedule appointment with APP (next available)  -bring home BP cuff, current medication and BP records to visit.

## 2018-04-20 NOTE — Telephone Encounter (Signed)
° ° °  Patient calling to request medication be changed prior to appt next week. Please call

## 2018-04-20 NOTE — Telephone Encounter (Signed)
Spoke with pt, she states that she was started on Metoprolol at last visit with Kerin Ransom PA-C, she states when she took it, it caused her BP to be even more elevated the day she took the medication, that night it was 189/103. She was extremely dizzy, and at that time became very anxious. She states she called on call doctor this weekend, who advised her to stop the medication. She is still currently taking her HCTZ 25 mg daily, and states in the morning and throughout the day the BP is normally good, it is at nighttime that her BP becomes very high. This morning it was 127/87 HR 81, but last night it was 152/88 HR 89. Patient denies CP, SOB, and swelling. On call doctor thought BP increase was related to anxiety, and I advised with patient regarding this, she states the only time she became anxious was when the BP was this elevated and at that time it began to worry her. Patient believes her medication needs to be changed, or increased. Please advise on any recommendations, patient is concerned to take the Metoprolol again.

## 2018-04-20 NOTE — Telephone Encounter (Signed)
New Message   Pt c/o BP issue: STAT if pt c/o blurred vision, one-sided weakness or slurred speech  1. What are your last 5 BP readings? SAT 189/103 hr 82  SUN 135/80 hr 67 SUN 143/86 Last night 177/92 hr 82   2. Are you having any other symptoms (ex. Dizziness, headache, blurred vision, passed out)? Dizziness   3. What is your BP issue? Pt called an on call doctor on SAT night  and they advised her to stop taking Metoprolol  Her BP is still high and she is having Dizziness. This morning it was 127/87 hr 81

## 2018-04-20 NOTE — Telephone Encounter (Signed)
Returned pt call. Pt sts that she was sen today by her pcp. Her BP in their office 146/80 80bpm. She also wanted to update Dr.Berry that her INR today was 5.5 and her dosing was changed. Her Coumadin is manage by her pcp Dr.Webb. Adv her to follow Dr.Webbs instructions regarding her Coumadin.  Adv pt to continue to monitor her BP and HR daily as previously instructed. She should bring the readings with her to her 2/12 o/v with Kerin Ransom, PA. Adv pt to contact the office sooner if her BP is consistently elevated. Pt agreeable and verbalizes understanding.

## 2018-04-21 ENCOUNTER — Telehealth: Payer: Self-pay | Admitting: *Deleted

## 2018-04-21 NOTE — Telephone Encounter (Signed)
See phone note 04/21/18

## 2018-04-21 NOTE — Telephone Encounter (Signed)
Spoke with patient and she stated she was told to stop her Metoprolol so she only took 1 dose. Discussed with Kerin Ransom PA and will have patient restart the Metoprolol 25 mg 1/2 tablet and see how she does. Advised patient verbalized understanding.    Chauncey, Katina H at 04/21/2018 11:44 AM   Status: Signed      Patient calling to report BP and dizziness.   Pt c/o BP issue: STAT if pt c/o blurred vision, one-sided weakness or slurred speech  1. What are your last 5 BP readings? 146/86 HR 95  2. Are you having any other symptoms (ex. Dizziness, headache, blurred vision, passed out)?  DIZZINESS  3. What is your BP issue? Patient concerned about HR

## 2018-04-21 NOTE — Telephone Encounter (Signed)
° °  Patient calling to report BP and dizziness.   Pt c/o BP issue: STAT if pt c/o blurred vision, one-sided weakness or slurred speech  1. What are your last 5 BP readings? 146/86 HR 95  2. Are you having any other symptoms (ex. Dizziness, headache, blurred vision, passed out)?  DIZZINESS  3. What is your BP issue? Patient concerned about HR

## 2018-04-22 DIAGNOSIS — E876 Hypokalemia: Secondary | ICD-10-CM | POA: Diagnosis not present

## 2018-04-22 DIAGNOSIS — R194 Change in bowel habit: Secondary | ICD-10-CM | POA: Diagnosis not present

## 2018-04-22 DIAGNOSIS — E871 Hypo-osmolality and hyponatremia: Secondary | ICD-10-CM | POA: Diagnosis not present

## 2018-04-22 DIAGNOSIS — E878 Other disorders of electrolyte and fluid balance, not elsewhere classified: Secondary | ICD-10-CM | POA: Diagnosis not present

## 2018-04-22 DIAGNOSIS — R1084 Generalized abdominal pain: Secondary | ICD-10-CM | POA: Diagnosis not present

## 2018-04-22 DIAGNOSIS — Z86010 Personal history of colonic polyps: Secondary | ICD-10-CM | POA: Diagnosis not present

## 2018-04-22 DIAGNOSIS — R42 Dizziness and giddiness: Secondary | ICD-10-CM | POA: Diagnosis not present

## 2018-04-23 ENCOUNTER — Ambulatory Visit: Payer: Medicare Other | Admitting: Cardiology

## 2018-04-27 DIAGNOSIS — N183 Chronic kidney disease, stage 3 (moderate): Secondary | ICD-10-CM | POA: Diagnosis not present

## 2018-04-27 DIAGNOSIS — Z6823 Body mass index (BMI) 23.0-23.9, adult: Secondary | ICD-10-CM | POA: Diagnosis not present

## 2018-04-27 DIAGNOSIS — M150 Primary generalized (osteo)arthritis: Secondary | ICD-10-CM | POA: Diagnosis not present

## 2018-04-27 DIAGNOSIS — M25551 Pain in right hip: Secondary | ICD-10-CM | POA: Diagnosis not present

## 2018-04-27 DIAGNOSIS — E663 Overweight: Secondary | ICD-10-CM | POA: Diagnosis not present

## 2018-04-27 DIAGNOSIS — M8580 Other specified disorders of bone density and structure, unspecified site: Secondary | ICD-10-CM | POA: Diagnosis not present

## 2018-04-27 DIAGNOSIS — M0589 Other rheumatoid arthritis with rheumatoid factor of multiple sites: Secondary | ICD-10-CM | POA: Diagnosis not present

## 2018-04-28 ENCOUNTER — Ambulatory Visit: Payer: Medicare Other | Admitting: Cardiovascular Disease

## 2018-04-29 ENCOUNTER — Ambulatory Visit (INDEPENDENT_AMBULATORY_CARE_PROVIDER_SITE_OTHER): Payer: Medicare Other | Admitting: Cardiology

## 2018-04-29 ENCOUNTER — Encounter: Payer: Self-pay | Admitting: Cardiology

## 2018-04-29 VITALS — BP 166/82 | HR 82 | Ht 65.0 in | Wt 143.6 lb

## 2018-04-29 DIAGNOSIS — I480 Paroxysmal atrial fibrillation: Secondary | ICD-10-CM

## 2018-04-29 DIAGNOSIS — R42 Dizziness and giddiness: Secondary | ICD-10-CM

## 2018-04-29 DIAGNOSIS — W19XXXD Unspecified fall, subsequent encounter: Secondary | ICD-10-CM | POA: Diagnosis not present

## 2018-04-29 DIAGNOSIS — Z7901 Long term (current) use of anticoagulants: Secondary | ICD-10-CM

## 2018-04-29 DIAGNOSIS — I10 Essential (primary) hypertension: Secondary | ICD-10-CM | POA: Diagnosis not present

## 2018-04-29 DIAGNOSIS — N183 Chronic kidney disease, stage 3 unspecified: Secondary | ICD-10-CM

## 2018-04-29 MED ORDER — AMLODIPINE BESYLATE 5 MG PO TABS: 5.0000 mg | ORAL_TABLET | Freq: Every day | ORAL | 3 refills | Status: DC

## 2018-04-29 NOTE — Patient Instructions (Signed)
Medication Instructions:  START Norvasc 5mg  Take 1 tablet once a day  TAKE YOUR OTHER MEDICATION ONLY AS PRESCRIBED   If you need a refill on your cardiac medications before your next appointment, please call your pharmacy.   Lab work: NONE  If you have labs (blood work) drawn today and your tests are completely normal, you will receive your results only by: Marland Kitchen MyChart Message (if you have MyChart) OR . A paper copy in the mail If you have any lab test that is abnormal or we need to change your treatment, we will call you to review the results.  Testing/Procedures: NONE  Follow-Up: At [facility], you and your health needs are our priority.  As part of our continuing mission to provide you with exceptional heart care, we have created designated Provider Care Teams.  These Care Teams include your primary Cardiologist (physician) and Advanced Practice Providers (APPs -  Physician Assistants and Nurse Practitioners) who all work together to provide you with the care you need, when you need it. You will need a follow up appointment in 3-4 months. Please call our office 2 months in advance to schedule this appointment. You may see Quay Burow, MD ONLY.  Any Other Special Instructions Will Be Listed Below (If Applicable).

## 2018-04-29 NOTE — Progress Notes (Signed)
04/29/2018 Danielle Hebert   [birth year]  [id number]  Primary Physician Maurice Small, MD Primary Cardiologist: Dr Gwenlyn Found  HPI:  Pleasant 84 y/o female who was admitted 12/30/17 after being found down at home. In speaking with the patient's son he thinks she was down for 48 hours. On admission she had evidence of rhabdomyolysis with AKI and elevated CK. Cardiology was consulted for AF which was new for her. The patient was hydrated and anticoagulated. Echo showed her EF to be 55-60% with basilar hypertrophy, grade 2 DD, and normal LA size. Amiodarone was added. She converted before discharge. After this she had problems with Amiodarone,nausea and shaking, and eventually this was discontinued. She also had a rash with Eliquis and was changed to Xarelto.  The rash actually got worse and she was eventually changed to Warfarin and is followed by her PCP.   I saw her in follow up 04/13/2018 after she presented to the ED with hyponatremia- Na+ 123.  She has been on different laxatives and has been followed by her PCP for suspected IBS.  She has followed up with her PCP since discharge and labs were drawn.  From a cardiac standpoint she is stable, no unusual palpitations. Her main complaints were GI and appear to be chronic.  Her B/P was slightly elevated and low dose Toprol was added.    She called recently (1/31/20200) c/o dizziness and elevated B/P which she thought was related to the Toprol.  An office note per our pharmacist 04/20/2018 instructed her to stop the Metoprolol but she is apparently still taking it.  She was added to my schedule today.  She complains of tinnitus (chronic) and pm "dizziness" and notes her B/P is higher in the evening.  She admitted to me she was taking an extra 1/2 HCTZ in the evening for this.  Recent labs per her PCP show her SCr, K+, Hgb all to be WNL.  Carotid dopplers done in 2017 showed no significant carotis stenosis though she had tortuous vessels.  Her EKG shows  NSR-75.    Current Outpatient Medications  Medication Sig Dispense Refill  . ALPRAZolam (XANAX) 0.5 MG tablet Take 0.5 mg by mouth at bedtime.   1  . cetirizine (ZYRTEC) 10 MG tablet Take 10 mg by mouth daily as needed for allergies.   11  . cholecalciferol (VITAMIN D) 1000 UNITS tablet Take 1,000 Units by mouth daily.    . cycloSPORINE (RESTASIS MULTIDOSE) 0.05 % ophthalmic emulsion Place 1 drop into both eyes 2 (two) times daily.    . fluticasone (FLONASE) 50 MCG/ACT nasal spray Place 1 spray into both nostrils daily.  11  . folic acid (FOLVITE) 1 MG tablet Take 1 mg by mouth daily.  0  . hydrochlorothiazide (HYDRODIURIL) 25 MG tablet Take 1 tablet (25 mg total) by mouth daily. 90 tablet 3  . Krill Oil 1000 MG CAPS Take 1,000 mg by mouth daily.    . Melatonin-Pyridoxine (MELATIN PO) Take 1 tablet by mouth at bedtime.    . methotrexate 2.5 MG tablet Take 12.5 mg by mouth once a week.  0  . metoprolol succinate (TOPROL-XL) 25 MG 24 hr tablet Take 25 mg by mouth as directed. 1/2 tablet by mouth daily    . metroNIDAZOLE (METROCREAM) 0.75 % cream Apply 1 application topically 2 (two) times daily. Apply cream to affected area  3  . omeprazole (PRILOSEC) 40 MG capsule Take 40 mg by mouth daily.  11  . ondansetron (ZOFRAN) 4  MG tablet Take 4 mg by mouth every 8 (eight) hours as needed.    . warfarin (COUMADIN) 5 MG tablet Take 5 mg by mouth daily.    Marland Kitchen amLODipine (NORVASC) 5 MG tablet Take 1 tablet (5 mg total) by mouth daily. 30 tablet 3   No current facility-administered medications for this visit.     Allergies  Allergen Reactions  . Eliquis [Apixaban] Rash  . Xarelto [Rivaroxaban] Rash  . Ciprofloxacin   . Remicade [Infliximab]   . Amiodarone Nausea Only    Past Medical History:  Diagnosis Date  . Hyperlipidemia   . Rheumatoid arthritis ([place])   . Rheumatoid arthritis ([place])     Social History   Socioeconomic History  . Marital status: Married    Spouse name: Not on file  .  Number of children: Not on file  . Years of education: Not on file  . Highest education level: Not on file  Occupational History  . Not on file  Social Needs  . Financial resource strain: Not on file  . Food insecurity:    Worry: Not on file    Inability: Not on file  . Transportation needs:    Medical: Not on file    Non-medical: Not on file  Tobacco Use  . Smoking status: Never Smoker  . Smokeless tobacco: Never Used  Substance and Sexual Activity  . Alcohol use: No  . Drug use: No  . Sexual activity: Not on file  Lifestyle  . Physical activity:    Days per week: Not on file    Minutes per session: Not on file  . Stress: Not on file  Relationships  . Social connections:    Talks on phone: Not on file    Gets together: Not on file    Attends religious service: Not on file    Active member of club or organization: Not on file    Attends meetings of clubs or organizations: Not on file    Relationship status: Not on file  . Intimate partner violence:    Fear of current or ex partner: Not on file    Emotionally abused: Not on file    Physically abused: Not on file    Forced sexual activity: Not on file  Other Topics Concern  . Not on file  Social History Narrative  . Not on file     Family History  Problem Relation Age of Onset  . Cancer Mother        lung  . Cancer Sister        breast     Review of Systems: General: negative for chills, fever, night sweats or weight changes.  Cardiovascular: negative for chest pain, dyspnea on exertion, edema, orthopnea, palpitations, paroxysmal nocturnal dyspnea or shortness of breath Dermatological: negative for rash Respiratory: negative for cough or wheezing Urologic: negative for hematuria Abdominal: negative for nausea, vomiting, diarrhea, bright red blood per rectum, melena, or hematemesis Neurologic: negative for visual changes, syncope All other systems reviewed and are otherwise negative except as noted  above.    Blood pressure (!) 166/82, pulse 82, height 5\' 5"  (1.651 m), weight 143 lb 9.6 oz (65.1 kg).  General appearance: alert, cooperative and no distress Neck: no carotid bruit and no JVD Lungs: clear to auscultation bilaterally Heart: regular rate and rhythm Extremities: no edema Skin: Skin color, texture, turgor normal. No rashes or lesions Neurologic: Grossly normal  EKG NSR-HR 75. Inferior Qs, Qs V5_V6  ASSESSMENT AND  PLAN:   Dizziness- I doubt this is from moderately elevated B/P, her heart rhythm is stable, LVF is normal, and prior carotid dopplers did not show significant carotid disease. Consider neurological evaluation.  Reviewed with Dr Gwenlyn Found in the office- will add low dose Amlodipine for HTN.  I told her not to take extra HCTZ.  She will continue Toprol.  Fall-Oct 2019 Pt admitted after a fall at home with evidence of Rhabdomyolysis. Fall suspected to be secondary to AF with RVR (new).  She could not tolerate Amiodarone.  She has been holding NSR on subsequent visits  Anticoagulated She couldn't tolerate Eliquis or Xarelto and is on Warfarin followed by her PCP- CHADS VASC=3  Diastolic dysfunction Grade 2 by echo Oct 2019 with EF 55-60% and basilar hypertrophy (LA WNL)  Essential hypertension Poor control-  She tells me her B/P at home runs 022'V systolic.   Hyponatremia Possible secondary to diuretic abuse- followed by PCP   PLAN  Add low dose Amlodipine for HTN.  I told her not to take extra HCTZ.  She will continue Toprol. She tells me she has an MRI scheduled.  We will see again in 3 months.  Kerin Ransom PA-C 04/29/2018 2:53 PM

## 2018-05-01 DIAGNOSIS — Z0000 Encounter for general adult medical examination without abnormal findings: Secondary | ICD-10-CM | POA: Diagnosis not present

## 2018-05-01 DIAGNOSIS — E785 Hyperlipidemia, unspecified: Secondary | ICD-10-CM | POA: Diagnosis not present

## 2018-05-01 DIAGNOSIS — I10 Essential (primary) hypertension: Secondary | ICD-10-CM | POA: Diagnosis not present

## 2018-05-01 DIAGNOSIS — Z7901 Long term (current) use of anticoagulants: Secondary | ICD-10-CM | POA: Diagnosis not present

## 2018-05-05 DIAGNOSIS — Z79899 Other long term (current) drug therapy: Secondary | ICD-10-CM | POA: Diagnosis not present

## 2018-05-05 DIAGNOSIS — M0589 Other rheumatoid arthritis with rheumatoid factor of multiple sites: Secondary | ICD-10-CM | POA: Diagnosis not present

## 2018-05-14 NOTE — Addendum Note (Signed)
Addended by: Leland Johns A on: 05/14/2018 04:46 PM   Modules accepted: Orders

## 2018-05-15 DIAGNOSIS — Z7901 Long term (current) use of anticoagulants: Secondary | ICD-10-CM | POA: Diagnosis not present

## 2018-05-22 DIAGNOSIS — Z7901 Long term (current) use of anticoagulants: Secondary | ICD-10-CM | POA: Diagnosis not present

## 2018-06-02 ENCOUNTER — Ambulatory Visit: Payer: Medicare Other | Admitting: Cardiovascular Disease

## 2018-06-05 DIAGNOSIS — Z7901 Long term (current) use of anticoagulants: Secondary | ICD-10-CM | POA: Diagnosis not present

## 2018-06-10 DIAGNOSIS — Z7901 Long term (current) use of anticoagulants: Secondary | ICD-10-CM | POA: Diagnosis not present

## 2018-06-17 ENCOUNTER — Ambulatory Visit: Payer: Medicare Other | Admitting: Neurology

## 2018-06-30 DIAGNOSIS — M0589 Other rheumatoid arthritis with rheumatoid factor of multiple sites: Secondary | ICD-10-CM | POA: Diagnosis not present

## 2018-08-04 ENCOUNTER — Telehealth: Payer: Self-pay

## 2018-08-04 ENCOUNTER — Ambulatory Visit: Payer: Medicare Other | Admitting: Cardiovascular Disease

## 2018-08-04 NOTE — Telephone Encounter (Signed)
Informed pt that per schedule notes, Dr. Gwenlyn Found would like pt to f/u IN OFFICE 3 mos from original appt of 5/19. Pt agreeable to f/u then and informed that scheduler will contact pt to set up an appt. Pt verbalized understanding        COVID-19 Pre-Screening Questions:  . In the past 7 to 10 days have you had a cough,  shortness of breath, headache, congestion, fever, body aches, chills, sore throat, or sudden loss of taste or sense of smell? NO . Have you been around anyone with known Covid 19? NO . Have you been around anyone who is awaiting Covid 19 test results in the past 7 to 10 days? NO . Have you been around anyone who has been exposed to Covid 19, or has mentioned symptoms of Covid 19 within the past 7 to 10 days? NO  If you have any concerns/questions about symptoms patients report during screening (either on the phone or at threshold). Contact the provider seeing the patient or DOD for further guidance.  If neither are available contact a member of the leadership team.    No new or worsening: CP No new or worsening: SOB No new or worsening: weight gain, leg swelling No new or worsening: fainting spells

## 2018-08-12 ENCOUNTER — Other Ambulatory Visit: Payer: Self-pay | Admitting: Cardiology

## 2018-08-25 ENCOUNTER — Telehealth: Payer: Self-pay | Admitting: Cardiovascular Disease

## 2018-08-25 DIAGNOSIS — Z79899 Other long term (current) drug therapy: Secondary | ICD-10-CM | POA: Diagnosis not present

## 2018-08-25 DIAGNOSIS — M0589 Other rheumatoid arthritis with rheumatoid factor of multiple sites: Secondary | ICD-10-CM | POA: Diagnosis not present

## 2018-08-25 NOTE — Telephone Encounter (Signed)
Patient with diagnosis of afib on warfarin for anticoagulation.    Procedure: Colonoscopy and endoscopy Date of procedure: TBD  CHADS2-VASc score of  5 (CHF, HTN, AGE, DM2, stroke/tia x 2, CAD, AGE, female)  Per office protocol, patient can hold warfarin for 5 days prior to procedure.

## 2018-08-25 NOTE — Telephone Encounter (Signed)
° °  ° °  [facility] Pre-operative Risk Assessment    Request for surgical clearance:  1. What type of surgery is being performed? Colonoscopy and endoscopy  2. When is this surgery scheduled? Pending Medical Clearance  3. What type of clearance is required (medical clearance vs. Pharmacy clearance to hold med vs. Both)? Both  4. Are there any medications that need to be held prior to surgery and how long?Eliquis for 3 days  5. Practice name and name of physician performing surgery? [name]   6. What is your office phone number: [phone]   7.   What is your office fax number: [phone]  8.   Anesthesia type (None, local, MAC, general) ? General (Propofol)   Patient called [name], and wanted to know if she was cleared for her Colonoscopy. She was told she was not allowed to have the procedure back in March, so she cancelled the appt. The office will reschedule pending clearance   Danielle Hebert 08/25/2018, 2:32 PM  _________________________________________________________________   (provider comments below)

## 2018-08-26 DIAGNOSIS — M150 Primary generalized (osteo)arthritis: Secondary | ICD-10-CM | POA: Diagnosis not present

## 2018-08-26 DIAGNOSIS — M8580 Other specified disorders of bone density and structure, unspecified site: Secondary | ICD-10-CM | POA: Diagnosis not present

## 2018-08-26 DIAGNOSIS — M0589 Other rheumatoid arthritis with rheumatoid factor of multiple sites: Secondary | ICD-10-CM | POA: Diagnosis not present

## 2018-08-26 DIAGNOSIS — M25551 Pain in right hip: Secondary | ICD-10-CM | POA: Diagnosis not present

## 2018-08-26 DIAGNOSIS — N183 Chronic kidney disease, stage 3 (moderate): Secondary | ICD-10-CM | POA: Diagnosis not present

## 2018-08-26 NOTE — Telephone Encounter (Signed)
   Primary Cardiologist: Quay Burow, MD  Chart reviewed as part of pre-operative protocol coverage. Given past medical history and time since last visit, based on ACC/AHA guidelines, Danielle Hebert would be at acceptable risk for the planned procedure without further cardiovascular testing.   Per office protocol, patient can hold warfarin for 5 days prior to procedure.    I will route this recommendation to the requesting party via Epic fax function and remove from pre-op pool.  Please call with questions.  Lyda Jester, PA-C 08/26/2018, 8:11 AM

## 2018-09-15 ENCOUNTER — Telehealth: Payer: Self-pay | Admitting: Cardiovascular Disease

## 2018-09-15 NOTE — Telephone Encounter (Signed)
Follow Up:    Pt called and said Dr Therisa Doyne have not received her clearance as of yesterday. Please call the office to find out what is the status of her clearance please. Dr Encarnacion Slates phone number is [phone].

## 2018-09-15 NOTE — Telephone Encounter (Signed)
Called [facility] and confirmed fax number is [phone].

## 2018-09-15 NOTE — Telephone Encounter (Signed)
Sunday Spillers, I'll be glad to re-fax them the clearance from 08/26/18. Can you please call and get the correct fax number? Lyda Jester originally faxed to [phone]. I just re-sent the fax to that number. Please confirm fax number.  Thank you Angie

## 2018-09-22 ENCOUNTER — Telehealth: Payer: Self-pay | Admitting: Cardiovascular Disease

## 2018-09-22 NOTE — Telephone Encounter (Signed)
Patient is not on eliquis, she is on coumadin. This is a duplicate request, see previous note on 08/25/2018 as patient was already cleared at that time. The clearance was refaxed to the requesting provider on 6/30 again.   I will ask our staff to manually fax the clearance to the requesting provider this time and get a confirmation that they received the clearance.

## 2018-09-22 NOTE — Telephone Encounter (Signed)
I have personally called Danielle Hebert and clarified with her. Apparently, she was switched back to eliquis in March. She says her previous rash may have been associated with amiodarone instead. Unfortunately, her INR was very difficult to control on coumadin, therefore she asked her PCP to do another trial with her back on the eliquis. Since March, she has not had any rash on the eliquis.   Will forward to our clinical pharmacist to review how long to hold eliquis prior to her procedure.   Otherwise, she denies any recent chest pain or shortness of breath. I think she would be at acceptable risk to proceed with procedure.

## 2018-09-22 NOTE — Telephone Encounter (Signed)
   Primary Cardiologist: Quay Burow, MD  Chart reviewed as part of pre-operative protocol coverage. Given past medical history and time since last visit, based on ACC/AHA guidelines, Katriana Dortch would be at acceptable risk for the planned procedure without further cardiovascular testing.   I will route this recommendation to the requesting party via Epic fax function and remove from pre-op pool.  Please call with questions. Per office protocol, patient can hold Eliquis for 1 day prior to procedure.    [place], [place] 09/22/2018, 6:26 PM

## 2018-09-22 NOTE — Telephone Encounter (Signed)
° ° °  ° °  [facility] Pre-operative Risk Assessment    Request for surgical clearance:  1. What type of surgery is being performed? colonoscopy  2. When is this surgery scheduled? TBD  3. What type of clearance is required (medical clearance vs. Pharmacy clearance to hold med vs. Both)? pharmacy  4. Are there any medications that need to be held prior to surgery and how long? Eliquis  5. Practice name and name of physician performing surgery? [facility]  6. What is your office phone number [phone]   7.   What is your office fax number [phone]   8.   Anesthesia type (None, local, MAC, general) ? Propofol    [facility] received a cease notice for warfarin, but the patient states she is on Eliquis not Warfarin, even though there is an allergy note for Eliquis.   [place] from [facility] wanted clarification about the patient's medication before scheduling the procedure   Johnna Acosta 09/22/2018, 3:11 PM  _________________________________________________________________   (provider comments below)

## 2018-09-22 NOTE — Telephone Encounter (Signed)
Patient with diagnosis of afib on Eliquis for anticoagulation.    Procedure: Colonoscopy Date of procedure: TBD  CHADS2-VASc score of  5 (CHF, HTN, AGE, AGE, female)  CrCl 38 ml/min  Per office protocol, patient can hold Eliquis for 1 day prior to procedure.

## 2018-09-22 NOTE — Telephone Encounter (Signed)
Called the requesting office spoke with [place] she stated that they did receive the fax and that the clearance is clearing for Coumadin/Warfin when they are asking for Eliquis. [place] stated that the patient told the office that she takes Eliquis which is prescribed by her PCP. The requesting office is wanting to get the correct clearance for the correct medication.

## 2018-09-23 NOTE — Telephone Encounter (Signed)
Called the patient and informed her to hold her Eliquis for 1 day prior to the procedure.Danielle Hebert verbalized an understanding and all if any questions were answered.

## 2018-09-23 NOTE — Telephone Encounter (Deleted)
Called the patient and informed her to hold her Eliquis for 1 day prior to the procedure.Danielle Hebert verbalized an understanding and all if any questions were answered.

## 2018-10-14 ENCOUNTER — Other Ambulatory Visit: Payer: Self-pay | Admitting: Gastroenterology

## 2018-10-14 DIAGNOSIS — R110 Nausea: Secondary | ICD-10-CM

## 2018-10-14 DIAGNOSIS — Z86010 Personal history of colonic polyps: Secondary | ICD-10-CM

## 2018-10-14 DIAGNOSIS — R109 Unspecified abdominal pain: Secondary | ICD-10-CM

## 2018-10-19 ENCOUNTER — Other Ambulatory Visit: Payer: Medicare Other

## 2018-10-20 DIAGNOSIS — M0589 Other rheumatoid arthritis with rheumatoid factor of multiple sites: Secondary | ICD-10-CM | POA: Diagnosis not present

## 2018-10-20 DIAGNOSIS — Z79899 Other long term (current) drug therapy: Secondary | ICD-10-CM | POA: Diagnosis not present

## 2018-10-22 ENCOUNTER — Telehealth: Payer: Self-pay | Admitting: Cardiovascular Disease

## 2018-10-22 NOTE — Telephone Encounter (Signed)
Follow Up  Patient calling back in to speak with Nurse. Please give patient a call back

## 2018-10-22 NOTE — Telephone Encounter (Signed)
New message   Pt c/o medication issue:  1. Name of Medication: ELIQUIS 5 MG TABS tablet  amLODipine (NORVASC) 5 MG tablet metoprolol succinate (TOPROL-XL) 25 MG 24 hr tablet  2. How are you currently taking this medication (dosage and times per day)? N/a  3. Are you having a reaction (difficulty breathing--STAT)?n/a  4. What is your medication issue? Patient believes that she is having an allergic reaction to some of this medication. Patient states that her tongue is tingling and mouth. Please call.

## 2018-10-22 NOTE — Telephone Encounter (Signed)
Patient reports only tingling off the tongue.  No swelling or itching, or SOB, or rash. She experienced a lesion on the tongue few months backs as well. Tngling is happeningvery close to lesion.  Patient instructed to resume Eliquis and amlodipine today. May resume metoprolol tomorrow and call back is additional help needed.

## 2018-10-22 NOTE — Telephone Encounter (Signed)
Called and spoke to patient, she states that last night her lips and tongue were tingling, this is the first time this has occurred, patient denies any new medication recently, and nothing new that she had ate or drank.   Patient states that she feels her bottom lip is still tingling and numb but she did take two benadryl this morning.   No Eliquis yet as she is waiting to hear if okay to take this.   Advised I would route to PharmD to advise.

## 2018-10-29 ENCOUNTER — Ambulatory Visit
Admission: RE | Admit: 2018-10-29 | Discharge: 2018-10-29 | Disposition: A | Discharge status: Home / Self Care | Payer: Medicare Other | Source: Ambulatory Visit | Attending: Gastroenterology | Admitting: Gastroenterology

## 2018-10-29 DIAGNOSIS — R109 Unspecified abdominal pain: Secondary | ICD-10-CM

## 2018-10-29 DIAGNOSIS — K449 Diaphragmatic hernia without obstruction or gangrene: Secondary | ICD-10-CM | POA: Diagnosis not present

## 2018-10-29 DIAGNOSIS — K219 Gastro-esophageal reflux disease without esophagitis: Secondary | ICD-10-CM | POA: Diagnosis not present

## 2018-10-29 DIAGNOSIS — R110 Nausea: Secondary | ICD-10-CM

## 2018-10-29 IMAGING — RF UPPER GI SERIES W/HIGH DENSITY WITHOUT KUB
5 series · 14 of 24 positions shown · non-contrast
Comparison: [DATE] CT abdomen/pelvis.

CLINICAL DATA: Nausea and abdominal pain for several months.

EXAM:
UPPER GI SERIES WITH KUB
TECHNIQUE: After obtaining a scout radiograph a routine upper GI series was
performed using thin and high density barium.
FLUOROSCOPY TIME:  Fluoroscopy Time:  2 minutes 24 seconds
Radiation Exposure Index (if provided by the fluoroscopic device):
216 mGy
Number of Acquired Spot Images: 13

[Series 1: one shot · 0.14mm/px · 6 of 12 slices shown (1 of 2)]
[im 1/12]
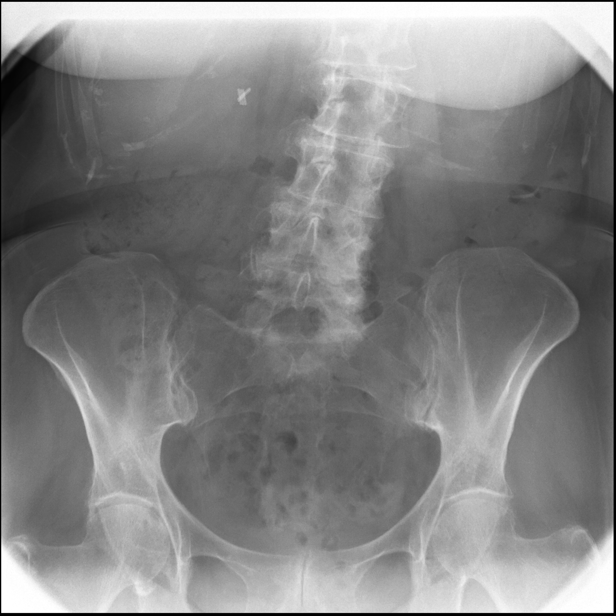
[im 3/12]
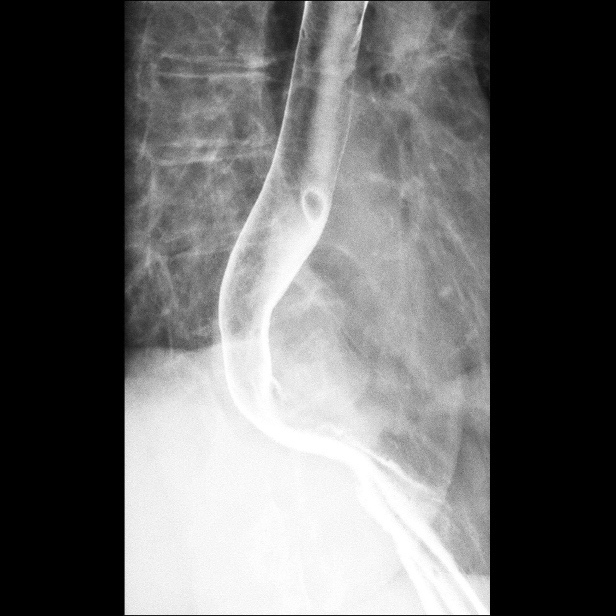
[im 6/12]
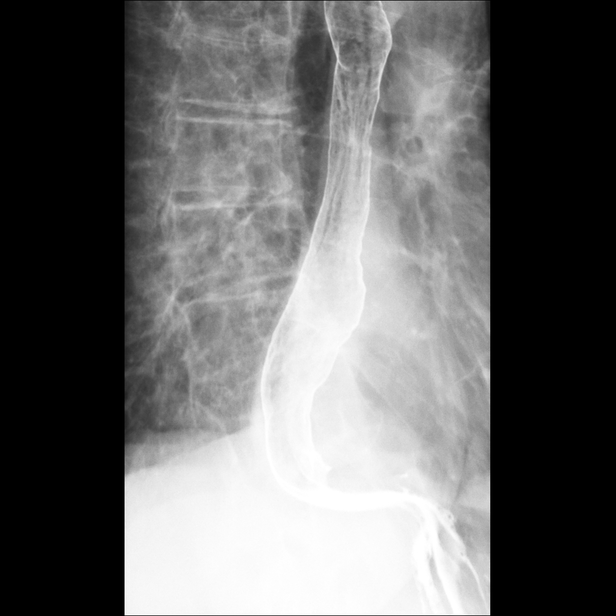
[im 8/12]
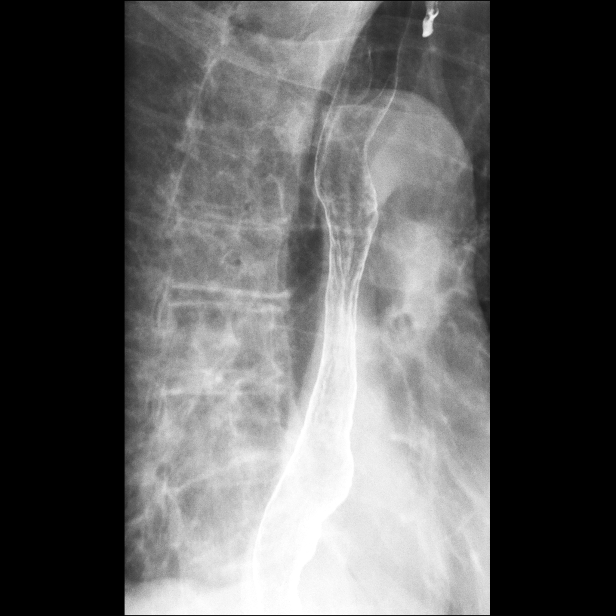
[im 9/12]
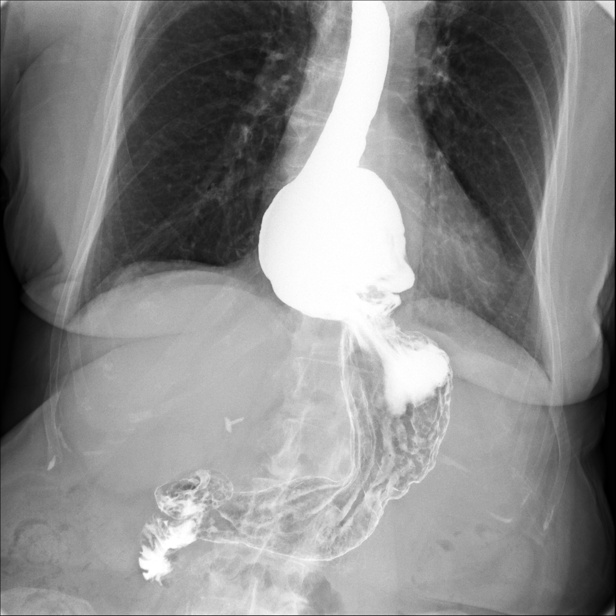
[im 11/12]
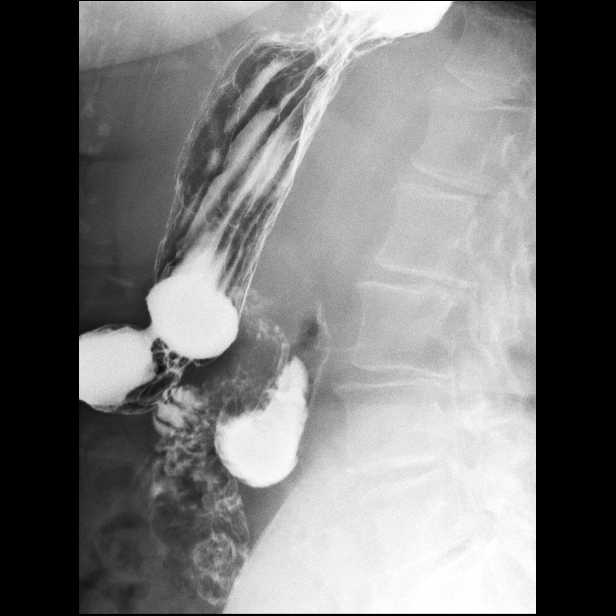

[Series 2: sequence · 2 of 54 frames shown (1 of 3)]
[frame 9/54]
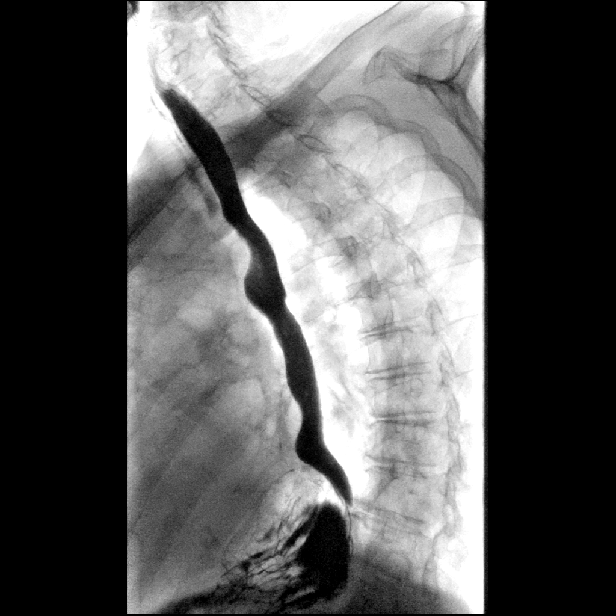
[frame 45/54]
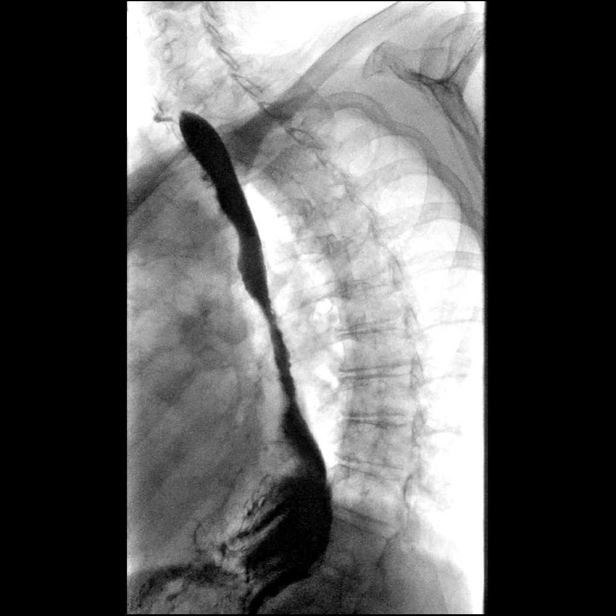

[Series 3: sequence · 2 of 67 frames shown (2 of 3)]
[frame 11/67]
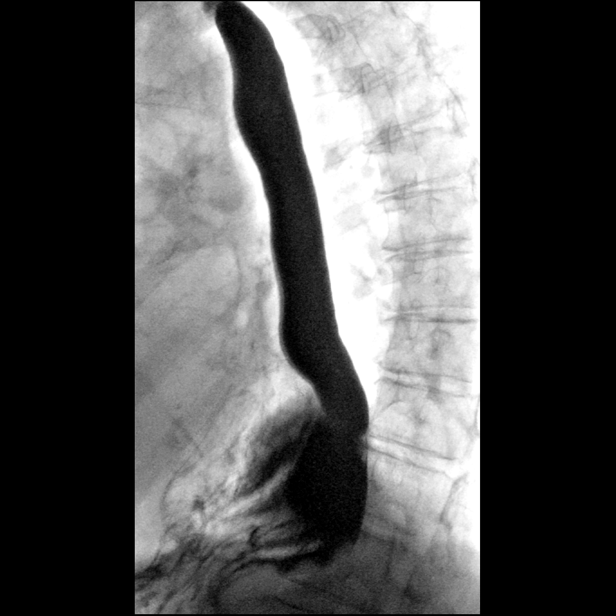
[frame 55/67]
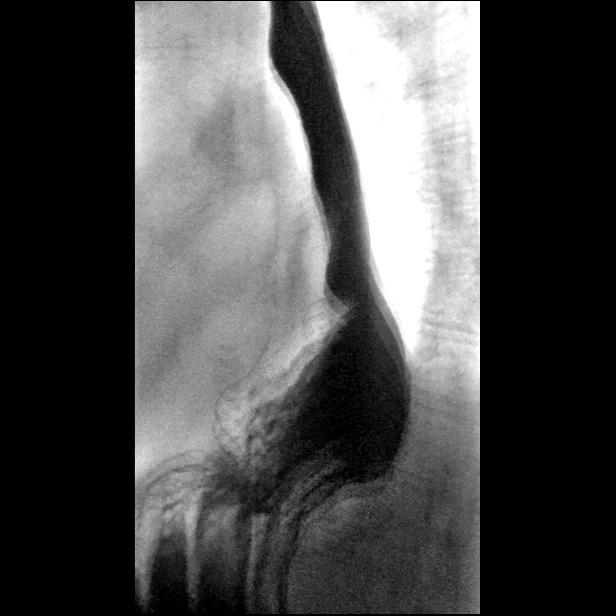

[Series 4: one shot · 0.15mm/px · 2 of 3 slices shown (2 of 2)]
[im 1/3]
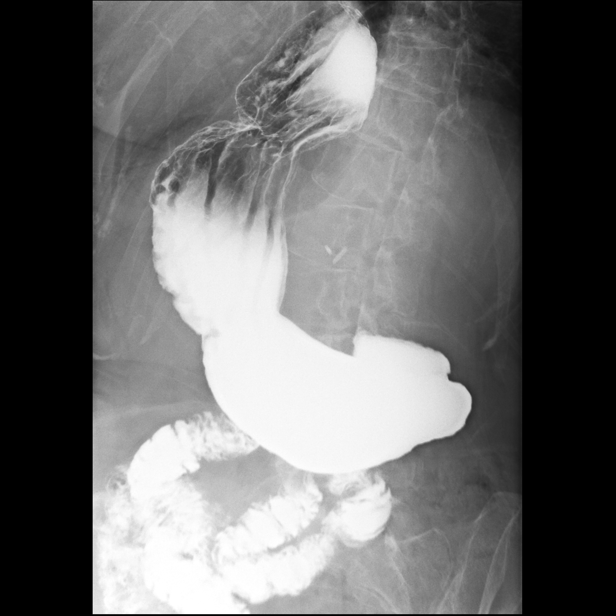
[im 2/3]
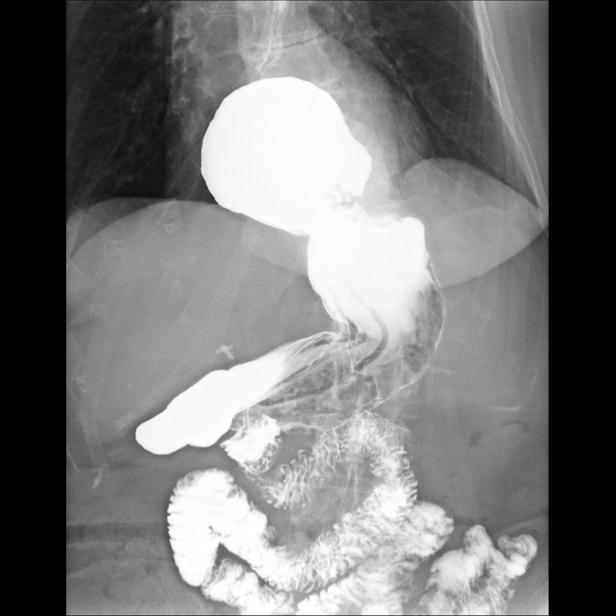

[Series 5: sequence · 2 of 36 frames shown (3 of 3)]
[frame 8/36]
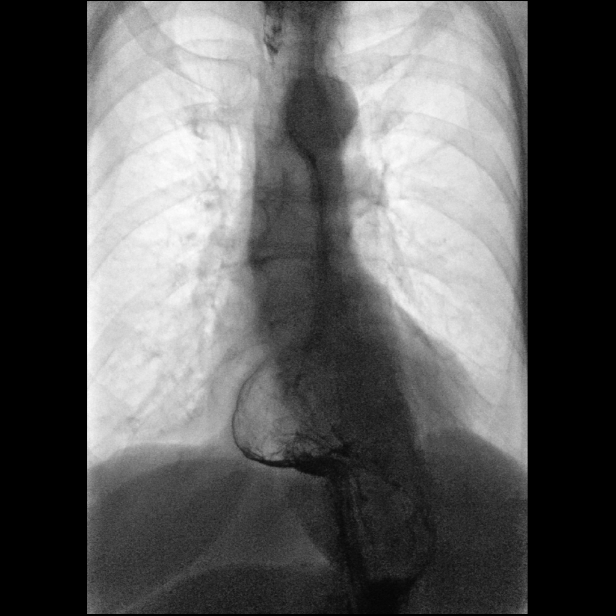
[frame 31/36]
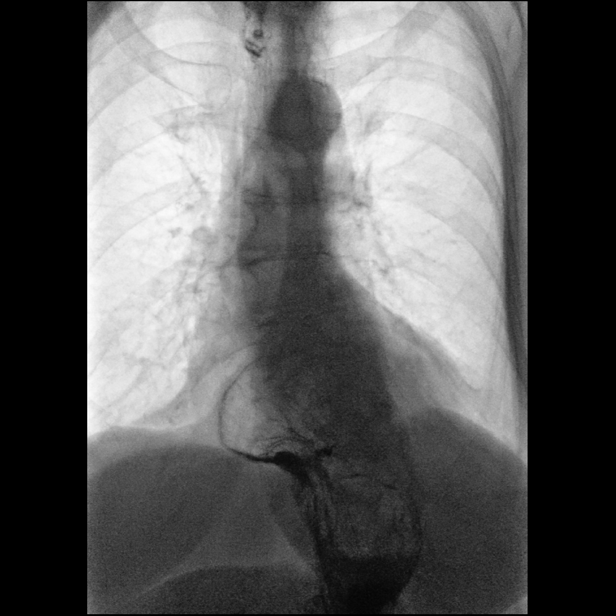

[14 of 24 positions shown; findings below may reference images not displayed]

FINDINGS: Scout radiograph demonstrates no dilated small bowel loops. Mild
colonic stool. No evidence of pneumatosis or pneumoperitoneum.
Cholecystectomy clips are seen in the right upper quadrant of the
abdomen. No radiopaque nephrolithiasis.

There is a moderate hiatal hernia. Moderate spontaneous
gastroesophageal reflux observed to the level of the upper thoracic
esophagus. Mild esophageal dysmotility, characterized by
intermittent mild weakening of primary peristalsis in the mid to
lower thoracic esophagus. Normal esophageal mucosa, with no evidence
of reflux esophagitis. Normal esophageal distensibility, with no
evidence of esophageal mass, stricture or ulcer. Barium tablet
traversed the esophagus into the stomach without delay. No evidence
of gastric fold thickening, filling defects or ulcers. Normal
gastric emptying. No gastric volvulus. Normal duodenal bulb and C
sweep, with no evidence of duodenal fold thickening, filling
defects, strictures or ulcers. Normal duodenal jejunal junction to
the left of the spine. Visualized proximal jejunal loops are normal
caliber and without fold thickening.
IMPRESSION: 1. Moderate hiatal hernia. Moderate spontaneous gastroesophageal
reflux.
2. Mild esophageal dysmotility, with a chronic reflux related
dysmotility pattern.
3. Otherwise normal upper GI.

## 2018-10-30 ENCOUNTER — Other Ambulatory Visit: Payer: Medicare Other

## 2018-11-13 ENCOUNTER — Ambulatory Visit
Admission: RE | Admit: 2018-11-13 | Discharge: 2018-11-13 | Disposition: A | Discharge status: Home / Self Care | Payer: Medicare Other | Source: Ambulatory Visit | Attending: Gastroenterology | Admitting: Gastroenterology

## 2018-11-13 DIAGNOSIS — R1030 Lower abdominal pain, unspecified: Secondary | ICD-10-CM | POA: Diagnosis not present

## 2018-11-13 DIAGNOSIS — Z86010 Personal history of colonic polyps: Secondary | ICD-10-CM

## 2018-11-13 DIAGNOSIS — K5730 Diverticulosis of large intestine without perforation or abscess without bleeding: Secondary | ICD-10-CM | POA: Diagnosis not present

## 2018-11-13 DIAGNOSIS — R102 Pelvic and perineal pain: Secondary | ICD-10-CM | POA: Diagnosis not present

## 2018-11-13 IMAGING — CT CT VIRTUAL COLONOSCOPY DIAGNOSTIC
2 of 9 series · 12 of 46 positions shown, 14 images · non-contrast
Comparison: CT abdomen/pelvis dated [DATE]

CLINICAL DATA: Lower abdominopelvic pain x3 months, 20 pound weight
loss, history of colonic polyps

EXAM:
CT VIRTUAL COLONOSCOPY DIAGNOSTIC
TECHNIQUE: The patient was given a standard Mag citrate bowel preparation with
Gastrografin and barium for fluid and stool tagging respectively.
The quality of the bowel preparation is moderate. Automated CO2
insufflation of the colon was performed prior to image acquisition
and colonic distention is excellent. Image post processing was used
to generate a 3D endoluminal fly-through projection of the colon and
to electronically subtract stool/fluid as appropriate.

[Series 3: supine colon 1.50 br40 s3 supine thins · axial · 0.74mm/px · z∈[+1100,+1505]mm · 9 of 338 slices shown, 11 images]
[im 34/338  soft-tissue]
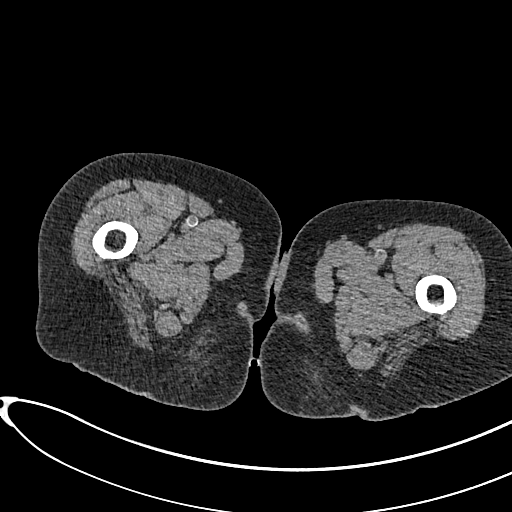
[im 34/338  bone]
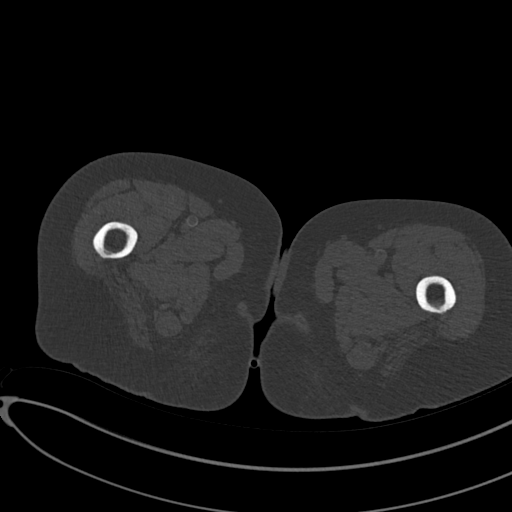
[im 68/338  soft-tissue]
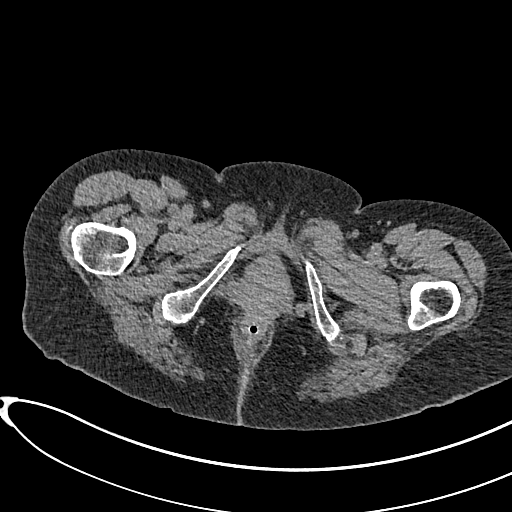
[im 102/338  soft-tissue]
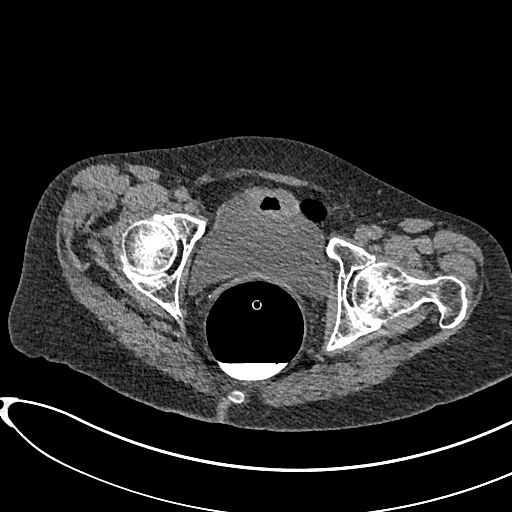
[im 135/338  soft-tissue]
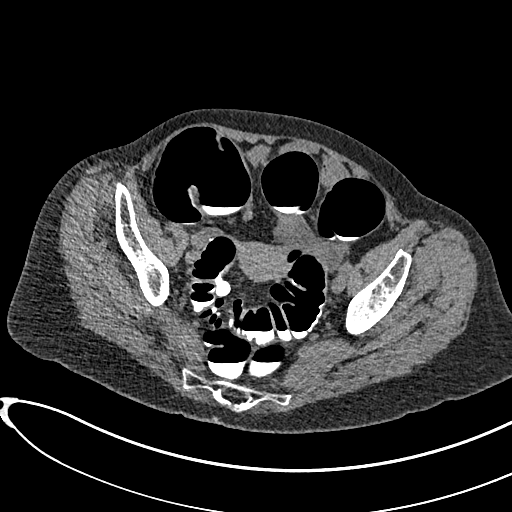
[im 169/338  soft-tissue]
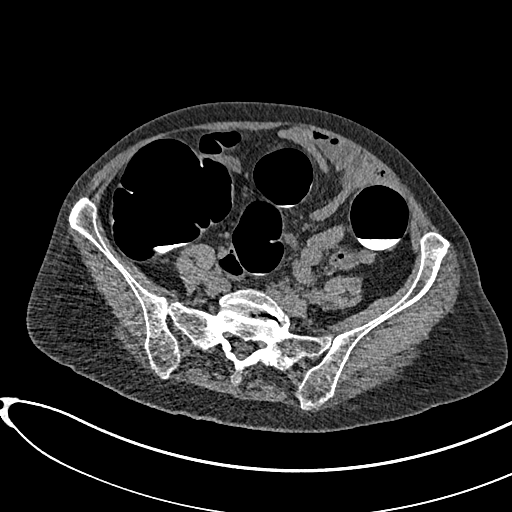
[im 203/338  soft-tissue]
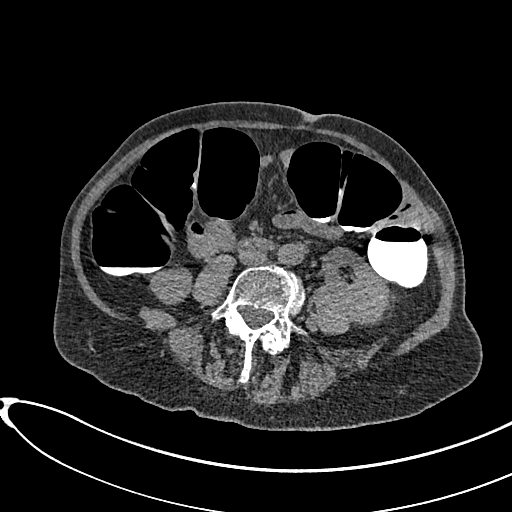
[im 236/338  soft-tissue]
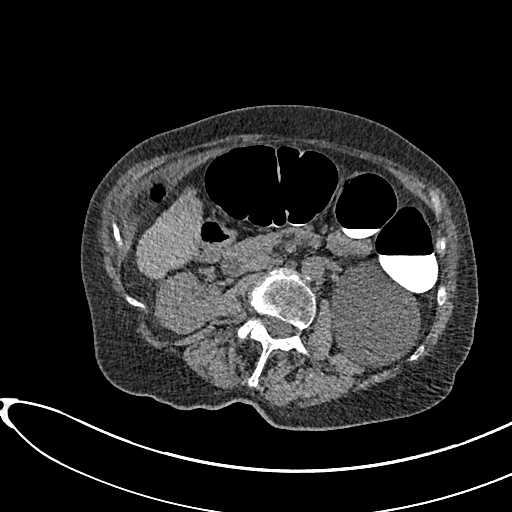
[im 270/338  soft-tissue]
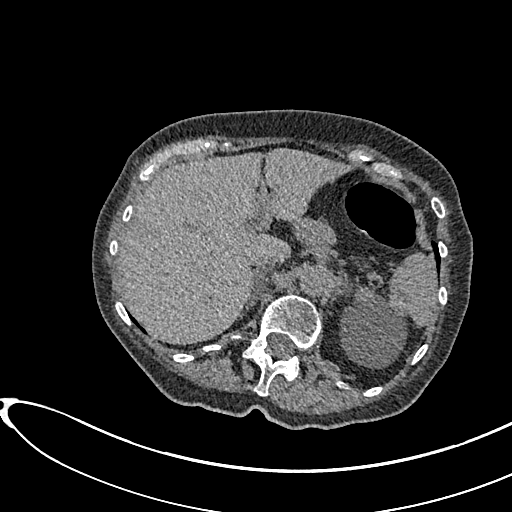
[im 304/338  soft-tissue]
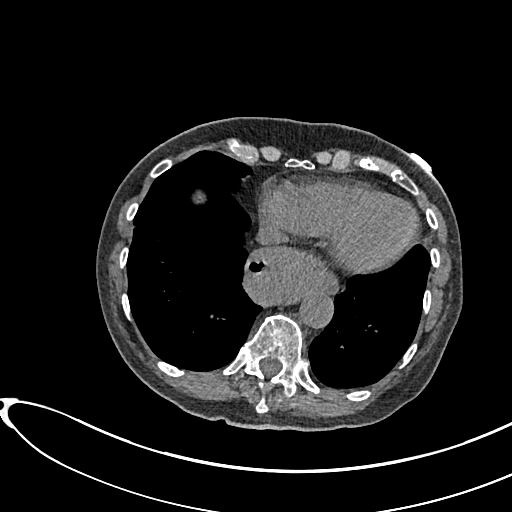
[im 304/338  bone]
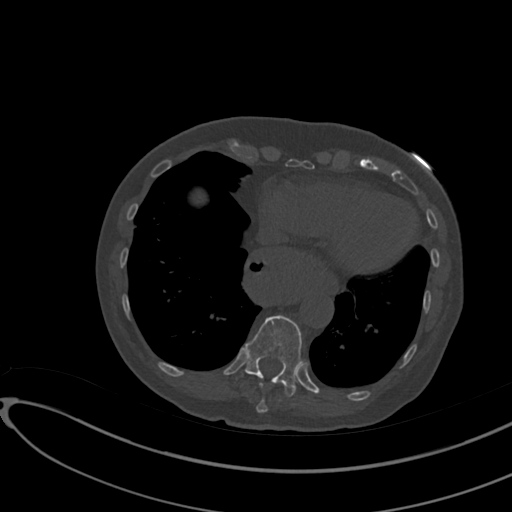

[Series 5: supine colon 3.00 br40 s3 cor supine · coronal · 0.71mm/px · 3 of 85 slices shown]
[im 22/85  soft-tissue]
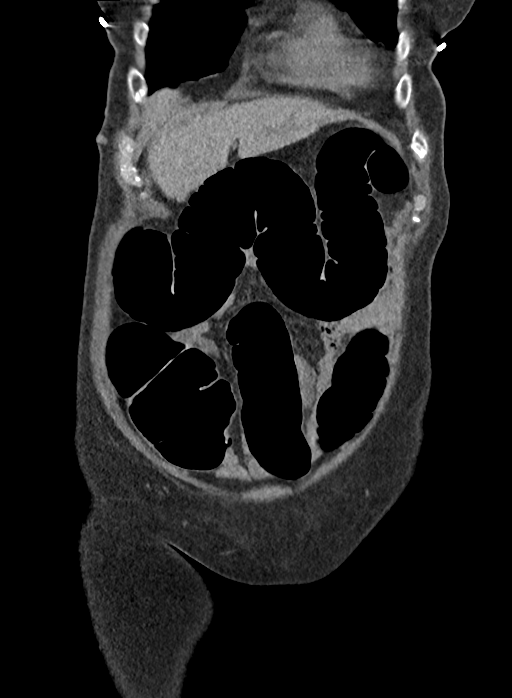
[im 43/85  soft-tissue]
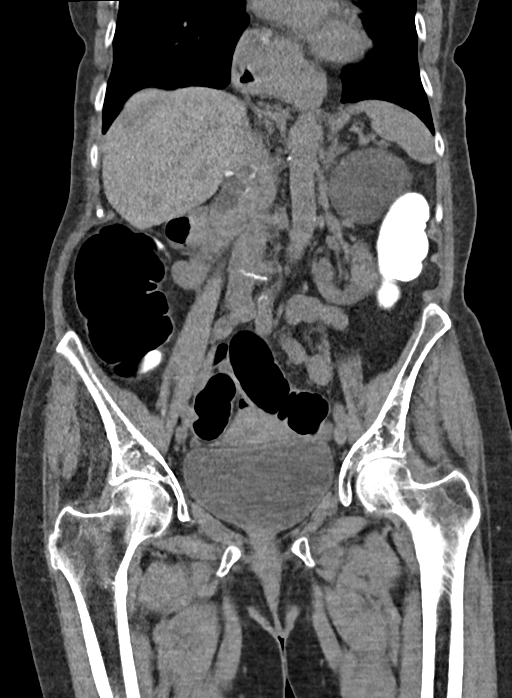
[im 64/85  soft-tissue]
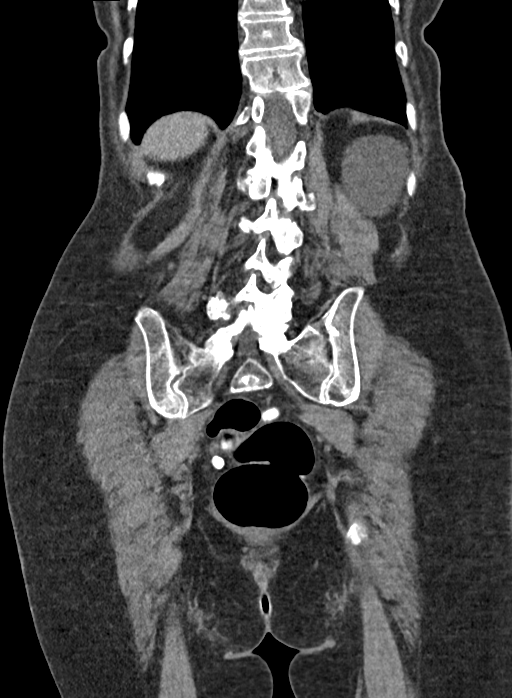

[12 of 46 positions shown; findings below may reference images not displayed]

FINDINGS: VIRTUAL COLONOSCOPY

Layering fluid in the colon, which shifts position on supine/prone
imaging, and does not constrain evaluation.

No significant colonic polyp, mass, apple core lesion, or stricture.

Scattered sigmoid diverticulosis, without evidence of
diverticulitis.

No evidence of bowel obstruction.

Normal appendix (series 4/image 105).

Virtual colonoscopy is not designed to detect diminutive polyps
(i.e., less than or equal to 5 mm), the presence or absence of which
may not affect clinical management.

CT ABDOMEN AND PELVIS WITHOUT CONTRAST

Lower chest: Lung bases are clear.

Hepatobiliary: Unenhanced liver is unremarkable.

Status post cholecystectomy. No intrahepatic or extrahepatic ductal
dilatation.

Pancreas: Within normal limits.

Spleen: Within normal limits.

Adrenals/Urinary Tract: Adrenal glands are within normal limits.

Bilateral renal cysts, including an 8.4 cm cyst in the left upper
kidney. No hydronephrosis.

Bladder is within normal limits.

Stomach/Bowel: Stomach is notable for a moderate hiatal hernia.

Visualized bowel is described above.

Vascular/Lymphatic: No evidence of abdominal aortic aneurysm.

No suspicious abdominopelvic lymphadenopathy.

Reproductive: Uterus is within normal limits.

Bilateral ovaries are unremarkable.

Other: No abdominopelvic ascites.

Musculoskeletal: Degenerative changes of the visualized
thoracolumbar spine.
IMPRESSION: No significant colonic polyp, mass, apple core lesion, or stricture.

No acute findings in the abdomen/pelvis. Stable ancillary findings
as above.

## 2018-11-24 DIAGNOSIS — Z23 Encounter for immunization: Secondary | ICD-10-CM | POA: Diagnosis not present

## 2018-12-02 ENCOUNTER — Ambulatory Visit: Payer: Medicare Other | Admitting: Cardiovascular Disease

## 2018-12-15 DIAGNOSIS — M0589 Other rheumatoid arthritis with rheumatoid factor of multiple sites: Secondary | ICD-10-CM | POA: Diagnosis not present

## 2018-12-15 DIAGNOSIS — Z79899 Other long term (current) drug therapy: Secondary | ICD-10-CM | POA: Diagnosis not present

## 2018-12-23 DIAGNOSIS — D225 Melanocytic nevi of trunk: Secondary | ICD-10-CM | POA: Diagnosis not present

## 2018-12-23 DIAGNOSIS — Z85828 Personal history of other malignant neoplasm of skin: Secondary | ICD-10-CM | POA: Diagnosis not present

## 2018-12-23 DIAGNOSIS — L821 Other seborrheic keratosis: Secondary | ICD-10-CM | POA: Diagnosis not present

## 2018-12-23 DIAGNOSIS — Z86018 Personal history of other benign neoplasm: Secondary | ICD-10-CM | POA: Diagnosis not present

## 2018-12-23 DIAGNOSIS — L814 Other melanin hyperpigmentation: Secondary | ICD-10-CM | POA: Diagnosis not present

## 2018-12-23 DIAGNOSIS — D239 Other benign neoplasm of skin, unspecified: Secondary | ICD-10-CM | POA: Diagnosis not present

## 2018-12-23 DIAGNOSIS — Z23 Encounter for immunization: Secondary | ICD-10-CM | POA: Diagnosis not present

## 2018-12-30 DIAGNOSIS — M0589 Other rheumatoid arthritis with rheumatoid factor of multiple sites: Secondary | ICD-10-CM | POA: Diagnosis not present

## 2018-12-30 DIAGNOSIS — M25551 Pain in right hip: Secondary | ICD-10-CM | POA: Diagnosis not present

## 2018-12-30 DIAGNOSIS — N183 Chronic kidney disease, stage 3 unspecified: Secondary | ICD-10-CM | POA: Diagnosis not present

## 2018-12-30 DIAGNOSIS — Z6822 Body mass index (BMI) 22.0-22.9, adult: Secondary | ICD-10-CM | POA: Diagnosis not present

## 2018-12-30 DIAGNOSIS — M150 Primary generalized (osteo)arthritis: Secondary | ICD-10-CM | POA: Diagnosis not present

## 2018-12-30 DIAGNOSIS — M8580 Other specified disorders of bone density and structure, unspecified site: Secondary | ICD-10-CM | POA: Diagnosis not present

## 2019-01-06 ENCOUNTER — Telehealth (INDEPENDENT_AMBULATORY_CARE_PROVIDER_SITE_OTHER): Payer: Medicare Other | Admitting: Cardiovascular Disease

## 2019-01-06 ENCOUNTER — Telehealth: Payer: Self-pay

## 2019-01-06 ENCOUNTER — Encounter: Payer: Self-pay | Admitting: Cardiovascular Disease

## 2019-01-06 ENCOUNTER — Encounter

## 2019-01-06 DIAGNOSIS — I480 Paroxysmal atrial fibrillation: Secondary | ICD-10-CM

## 2019-01-06 DIAGNOSIS — E782 Mixed hyperlipidemia: Secondary | ICD-10-CM

## 2019-01-06 DIAGNOSIS — I10 Essential (primary) hypertension: Secondary | ICD-10-CM

## 2019-01-06 MED ORDER — AMLODIPINE BESYLATE 2.5 MG PO TABS: 2.5000 mg | ORAL_TABLET | Freq: Every day | ORAL | 3 refills | Status: DC

## 2019-01-06 NOTE — Progress Notes (Signed)
Virtual Visit via Telephone Note   This visit type was conducted due to national recommendations for restrictions regarding the COVID-19 Pandemic (e.g. social distancing) in an effort to limit this patient's exposure and mitigate transmission in our community.  Due to her co-morbid illnesses, this patient is at least at moderate risk for complications without adequate follow up.  This format is felt to be most appropriate for this patient at this time.  The patient did not have access to video technology/had technical difficulties with video requiring transitioning to audio format only (telephone).  All issues noted in this document were discussed and addressed.  No physical exam could be performed with this format.  Please refer to the patient's chart for her  consent to telehealth for [facility].   Date:  01/06/2019   ID:  Danielle Hebert, DOB 21-Aug-1934, MRN [id number]  Patient Location: Home Provider Location: Home  PCP:  Maurice Small, MD  Cardiologist:  Quay Burow, MD  Electrophysiologist:  None   Evaluation Performed:  Follow-Up Visit  Chief Complaint: Hypertension/PAF  History of Present Illness:    Danielle Hebert is a 84 y.o. female who presented 02/25/2018 for hospital follow-up.  I initially saw her during her index hospitalization 12/31/2017 when she came in with altered mental status after being found down for 2 days at home she did have rhabdomyolysis, elevated grade troponins which that I did not think were related to ACS and A. fib with RVR.  She had normal LV function.  She was ultimately placed on Eliquis and amiodarone converted to sinus rhythm.  A 2D echo was normal.  She was in sinus rhythm on 01/22/2018 when she saw Kerin Ransom in the office.  Her major complaint was of profound nausea and and shaking.    I discontinued her amiodarone.  She does have IBS and sees a gastroenterologist for this who put her on twice daily magnesium supplementation.  Since I  saw her close to a year ago she is done well.  She does check her blood pressure at home on a regular basis and it has run on the low side.  She is not had any recurrent symptoms of PAF.  She denies chest pain or shortness of breath.   The patient does not have symptoms concerning for COVID-19 infection (fever, chills, cough, or new shortness of breath).    Past Medical History:  Diagnosis Date  . Hyperlipidemia   . Rheumatoid arthritis ([place])   . Rheumatoid arthritis [facility])    Past Surgical History:  Procedure Laterality Date  . CHOLECYSTECTOMY       No outpatient medications have been marked as taking for the 01/06/19 encounter (Appointment) with Lorretta Harp, MD.     Allergies:   Eliquis [apixaban], Xarelto [rivaroxaban], Ciprofloxacin, Remicade [infliximab], and Amiodarone   Social History   Tobacco Use  . Smoking status: Never Smoker  . Smokeless tobacco: Never Used  Substance Use Topics  . Alcohol use: No  . Drug use: No     Family Hx: The patient's family history includes Cancer in her mother and sister.  ROS:   Please see the history of present illness.     All other systems reviewed and are negative.   Prior CV studies:   The following studies were reviewed today:  None  Labs/Other Tests and Data Reviewed:    EKG:  No ECG reviewed.  Recent Labs: 04/04/2018: ALT 14; BUN 10; Creatinine, Ser 1.20; Hemoglobin 12.0; Platelets 330; Potassium  3.0; Sodium 123   Recent Lipid Panel Lab Results  Component Value Date/Time   CHOL 186 12/31/2017 12:10 AM   TRIG 68 12/31/2017 12:10 AM   HDL 69 12/31/2017 12:10 AM   CHOLHDL 2.7 12/31/2017 12:10 AM   LDLCALC 103 (H) 12/31/2017 12:10 AM    Wt Readings from Last 3 Encounters:  04/29/18 143 lb 9.6 oz (65.1 kg)  04/13/18 147 lb (66.7 kg)  01/22/18 155 lb 3.2 oz (70.4 kg)     Objective:    Vital Signs:  There were no vitals taken for this visit.   VITAL SIGNS:  reviewed blood pressure measured by the  patient at home today was 111/72.  A complete physical exam was not performed since this was a virtual telemedicine phone visit  ASSESSMENT & PLAN:    1. Paroxysmal atrial fibrillation-history of PAF in the past in the setting of being hospitalized for altered mental status and rhabdomyolysis with rapid return of sinus rhythm.  She was on amiodarone which was discontinued.  She remains on Eliquis without recurrence 2. Essential hypertension-history of essential hypertension on amlodipine, metoprolol and hydrochlorothiazide.  I have cut her metoprolol in half in the past.  Her blood pressure this morning was 111/72 and she says it has been running on the low side.  I asked her to cut her amlodipine in half from 5 mg a day to 2.5 mg a day and to keep a blood pressure log for the next 30 days.  Cyril Mourning will call her after that to review make appropriate changes. 3. Hyperlipidemia-history of hyperlipidemia with lipid profile performed 05/01/2018 revealing total cholesterol 231, LDL 147 and HDL of 63.  We will repeat her fasting lipid profile  COVID-19 Education: The signs and symptoms of COVID-19 were discussed with the patient and how to seek care for testing (follow up with PCP or arrange E-visit).  The importance of social distancing was discussed today.  Time:   Today, I have spent 6 minutes with the patient with telehealth technology discussing the above problems.     Medication Adjustments/Labs and Tests Ordered: Current medicines are reviewed at length with the patient today.  Concerns regarding medicines are outlined above.   Tests Ordered: No orders of the defined types were placed in this encounter.   Medication Changes: No orders of the defined types were placed in this encounter.   Follow Up:  In Person in 1 year(s)  Signed, Quay Burow, MD  01/06/2019 8:51 AM    [place]

## 2019-01-06 NOTE — Patient Instructions (Signed)
I just got off the phone with [name]. I have asked her to cut her amlodipine in half from 5 mg a day to 2.5 mg a day. I have asked her to keep a blood pressure log for 30 days and have Kristen reach out to her to review in 4 weeks. She also needs a fasting lipid liver profile. Back to see me in 1 year.  Medication Instructions:  Your physician has recommended you make the following change in your medication:   DECREASE YOUR AMLODIPINE (NORVASC) TO 2.5 MG BY MOUTH DAILY  *If you need a refill on your cardiac medications before your next appointment, please call your pharmacy*  Lab Work: Your physician recommends that you return for lab work within 1 week: [place] AND LIVER FUNCTION TEST. YOU WILL RECEIVE A LAB SLIP IN THE MAIL. PLEASE DO NOT EAT OR DRINK (EXCEPT WATER) ANYTHING AFTER MIDNIGHT ON THE DAY YOU CHOOSE TO PRESENT FOR LAB WORK. YOU MAY EAT AFTER YOUR BLOOD HAS BEEN COLLECTED. NO APPOINTMENT IS NEEDED.  If you have labs (blood work) drawn today and your tests are completely normal, you will receive your results only by: Marland Kitchen MyChart Message (if you have MyChart) OR . A paper copy in the mail If you have any lab test that is abnormal or we need to change your treatment, we will call you to review the results.  Testing/Procedures: NONE  Follow-Up: At [facility], you and your health needs are our priority.  As part of our continuing mission to provide you with exceptional heart care, we have created designated Provider Care Teams.  These Care Teams include your primary Cardiologist (physician) and Advanced Practice Providers (APPs -  Physician Assistants and Nurse Practitioners) who all work together to provide you with the care you need, when you need it.  Your next appointment:   12 months  The format for your next appointment:   In Person  Provider:   Quay Burow, MD  Other Instructions KEEP A BLOOD PRESSURE LOG FOR 30 DAYS THEN FOLLOW UP WITH A  CLINICAL PHARMACIST IN [place]. YOU WILL NEED AN IN-PERSON APPOINTMENT IN THE OFFICE. PLEASE CALL TO SCHEDULE THIS IN-PERSON APPOINTMENT IF YOU DID NOT ALREADY DO SO DURING YOUR LAST VISIT AT [facility].

## 2019-01-06 NOTE — Telephone Encounter (Signed)
Left message for patient to call office back to prechart before virtual visit with Dr. Gwenlyn Found.

## 2019-01-06 NOTE — Telephone Encounter (Signed)
Patient aware of 10/21 AVS instructions and verbalized understanding.  Pt would prefer to get labs done at PCP office. One month appt set up with pharmd 11/25  Letter including After Visit Summary and any other necessary documents to be mailed to the patient's address on file.  Staff message sent to Medical Records pool to mail AVS and lab slip to patient address.

## 2019-01-20 DIAGNOSIS — E782 Mixed hyperlipidemia: Secondary | ICD-10-CM | POA: Diagnosis not present

## 2019-01-20 LAB — HEPATIC FUNCTION PANEL
ALT: 12 IU/L (ref 0–32)
AST: 19 IU/L (ref 0–40)
Albumin: 4.7 g/dL — ABNORMAL HIGH (ref 3.6–4.6)
Alkaline Phosphatase: 81 IU/L (ref 39–117)
Bilirubin Total: 0.4 mg/dL (ref 0.0–1.2)
Bilirubin, Direct: 0.11 mg/dL (ref 0.00–0.40)
Total Protein: 7.3 g/dL (ref 6.0–8.5)

## 2019-01-20 LAB — LIPID PANEL
Chol/HDL Ratio: 3.3 ratio (ref 0.0–4.4)
Cholesterol, Total: 216 mg/dL — ABNORMAL HIGH (ref 100–199)
HDL: 65 mg/dL (ref >39)
LDL Chol Calc (NIH): 127 mg/dL — ABNORMAL HIGH (ref 0–99)
Triglycerides: 134 mg/dL (ref 0–149)
VLDL Cholesterol Cal: 24 mg/dL (ref 5–40)

## 2019-01-22 ENCOUNTER — Telehealth: Payer: Self-pay | Admitting: *Deleted

## 2019-01-22 DIAGNOSIS — E782 Mixed hyperlipidemia: Secondary | ICD-10-CM

## 2019-01-22 MED ORDER — EZETIMIBE 10 MG PO TABS: 10.0000 mg | ORAL_TABLET | Freq: Every day | ORAL | 3 refills | Status: DC

## 2019-01-22 NOTE — Telephone Encounter (Addendum)
pt aware of results  New script sent to the pharmacy  Lab orders mailed to the pt    ----- Message from Lorretta Harp, MD sent at 01/21/2019  1:01 PM EST ----- Not at goal for secondary prevention.  Would add Zetia 10 mg a day and recheck in 2 months

## 2019-02-09 DIAGNOSIS — M0589 Other rheumatoid arthritis with rheumatoid factor of multiple sites: Secondary | ICD-10-CM | POA: Diagnosis not present

## 2019-02-09 DIAGNOSIS — Z79899 Other long term (current) drug therapy: Secondary | ICD-10-CM | POA: Diagnosis not present

## 2019-02-10 ENCOUNTER — Other Ambulatory Visit: Payer: Self-pay

## 2019-02-10 ENCOUNTER — Ambulatory Visit (INDEPENDENT_AMBULATORY_CARE_PROVIDER_SITE_OTHER): Payer: Medicare Other | Admitting: Pharmacist Clinician (PhC)/ Clinical Pharmacy Specialist

## 2019-02-10 DIAGNOSIS — I10 Essential (primary) hypertension: Secondary | ICD-10-CM | POA: Diagnosis not present

## 2019-02-10 NOTE — Patient Instructions (Addendum)
Call our office if you have any questions or concerns - or if you see your home readings consistently go > [id number] systolic.  You can reach me at [phone].(Kristin/Raquel)  Your blood pressure today is  132/72  Check your blood pressure at home once or twice daily, 4-5 times per week, and keep record of the readings.  Take your BP meds as follows:  Stop taking hydrochlorothiazide.  Continue with the amldodipine 2.5 mg once daily (you can take this in the morning or evening)  Bring all of your meds, your BP cuff and your record of home blood pressures to your next appointment.  Exercise as you're able, try to walk approximately 30 minutes per day.  Keep salt intake to a minimum, especially watch canned and prepared boxed foods.  Eat more fresh fruits and vegetables and fewer canned items.  Avoid eating in fast food restaurants.    HOW TO TAKE YOUR BLOOD PRESSURE: . Rest 5 minutes before taking your blood pressure. .  Don't smoke or drink caffeinated beverages for at least 30 minutes before. . Take your blood pressure before (not after) you eat. . Sit comfortably with your back supported and both feet on the floor (don't cross your legs). . Elevate your arm to heart level on a table or a desk. . Use the proper sized cuff. It should fit smoothly and snugly around your bare upper arm. There should be enough room to slip a fingertip under the cuff. The bottom edge of the cuff should be 1 inch above the crease of the elbow. . Ideally, take 3 measurements at one sitting and record the average.

## 2019-02-10 NOTE — Progress Notes (Signed)
02/10/2019 Talaiya Pham June 03, 1934 [id number]   HPI:  Danielle Hebert is a 84 y.o. female patient of Dr Gwenlyn Found, with a [place] below who presents today for hypertension clinic evaluation.  See pertinent medical history below.  When she saw Dr. Gwenlyn Found last month her blood pressure (taken by patient - telehealth visit) was 111/72.  She noted that most of her home readings have been on the low side of normal, although she has not been symptomatic for orthostasis.  Dr. Gwenlyn Found asked her to decrease her amlodipine from 5 mg to 2.5 mg daily.  She returns to day for a follow up.  She has been feeling fine since decreasing the dose of amlodipine, notes that her blood pressure did not rise significantly with the decrease in medication.  In looking thru her labs, her sodium level has been in the low 120's earlier this year, but then bounced back up to 130.    Past Medical History: Paroxysmal AF On Eliquis  hyperlipidemia 01/2019 TC 216, TG 134, HDL 65,LDL 127 - added ezetimibe  rhabdomyolysis 10/19 CK to 3,292 while on rosuvastatin 10 mg  CKD 1/20 SCr 1.20  Abnormal LFT 11/20 WNL, elevated to 114 in 12/2017 with rhabodmyolysis     Blood Pressure Goal:  130/80  Current Medications:  Amlodipine 2.5 mg qd, hydrochlorothiazide 25 mg qd, metoprolol succ 12.5 mg qd  Social Hx: no, no , 1 coffee per morning  Diet:  Adding salt due to chronic low sodium - does salt food, drinks gatorade daily  Exercise: likes to walk mid-afternoon, but rececently too cold  Home BP readings:  Checked her pressure at home x 3 in one sitting, twice daily for the past 4 weeks.  Range of [phone].  Only 1 reading > 130.    Intolerances: rosuvastatin  (documented rhabdomyolysis)  Wt Readings from Last 3 Encounters:  02/10/19 137 lb 6.4 oz (62.3 kg)  01/06/19 133 lb (60.3 kg)  04/29/18 143 lb 9.6 oz (65.1 kg)   BP Readings from Last 3 Encounters:  02/10/19 132/72  01/06/19 111/72  04/29/18 (!) 166/82    Pulse Readings from Last 3 Encounters:  02/10/19 95  01/06/19 83  04/29/18 82    Current Outpatient Medications  Medication Sig Dispense Refill  . ALPRAZolam (XANAX) 0.5 MG tablet Take 0.5 mg by mouth at bedtime.   1  . amLODipine (NORVASC) 2.5 MG tablet Take 1 tablet (2.5 mg total) by mouth daily. 90 tablet 3  . cetirizine (ZYRTEC) 10 MG tablet Take 10 mg by mouth daily as needed for allergies.   11  . cholecalciferol (VITAMIN D) 1000 UNITS tablet Take 1,000 Units by mouth daily.    . cycloSPORINE (RESTASIS MULTIDOSE) 0.05 % ophthalmic emulsion Place 1 drop into both eyes 2 (two) times daily.    Marland Kitchen ELIQUIS 5 MG TABS tablet TAKE 1 TABLET BY MOUTH TWICE DAILY FOR 30 DAYS    . ezetimibe (ZETIA) 10 MG tablet Take 1 tablet (10 mg total) by mouth daily. 90 tablet 3  . fluticasone (FLONASE) 50 MCG/ACT nasal spray Place 1 spray into both nostrils daily.  11  . folic acid (FOLVITE) 1 MG tablet Take 1 mg by mouth daily.  0  . hydrochlorothiazide (HYDRODIURIL) 25 MG tablet Take 1 tablet (25 mg total) by mouth daily. 90 tablet 3  . magnesium oxide (MAG-OX) 400 MG tablet Take 400 mg by mouth 2 (two) times daily.    . Melatonin-Pyridoxine (MELATIN PO) Take 1 tablet  by mouth daily as needed.     . methotrexate 2.5 MG tablet Take 12.5 mg by mouth once a week.  0  . metoprolol succinate (TOPROL-XL) 25 MG 24 hr tablet Take 25 mg by mouth as directed. 1/2 tablet by mouth daily    . metroNIDAZOLE (METROCREAM) 0.75 % cream Apply 1 application topically 2 (two) times daily. Apply cream to affected area  3  . omeprazole (PRILOSEC) 40 MG capsule Take 40 mg by mouth daily.  11  . ondansetron (ZOFRAN) 4 MG tablet Take 4 mg by mouth every 8 (eight) hours as needed.    Javier Docker Oil 1000 MG CAPS Take 1,000 mg by mouth daily.     No current facility-administered medications for this visit.     Allergies  Allergen Reactions  . Eliquis [Apixaban] Rash  . Xarelto [Rivaroxaban] Rash  . Ciprofloxacin   . Remicade  [Infliximab]   . Amiodarone Nausea Only    Past Medical History:  Diagnosis Date  . Hyperlipidemia   . Rheumatoid arthritis ([place])   . Rheumatoid arthritis ([facility])     Blood pressure 132/72, pulse 95, resp. rate 15, height 5\' 5"  (1.651 m), weight 137 lb 6.4 oz (62.3 kg), SpO2 98 %.  Essential hypertension Patient with well controlled blood pressure, asymptomatic even at [id number] systolic.  Because of her ongoing problems with hyponatremia, will have her discontinue hydrochlorothiazide at this time.  Hopefully this will help increase both her blood pressure and sodium levels.  She should continue to take home blood pressure readings just a few times each week.  I asked that she call our office should she notice that home readings increase to > 140.     Tommy Medal PharmD CPP [facility] [address] [place], [place] [address] [phone]

## 2019-02-10 NOTE — Assessment & Plan Note (Signed)
Patient with well controlled blood pressure, asymptomatic even at [id number] systolic.  Because of her ongoing problems with hyponatremia, will have her discontinue hydrochlorothiazide at this time.  Hopefully this will help increase both her blood pressure and sodium levels.  She should continue to take home blood pressure readings just a few times each week.  I asked that she call our office should she notice that home readings increase to > 140.

## 2019-03-08 ENCOUNTER — Telehealth: Payer: Self-pay

## 2019-03-08 NOTE — Telephone Encounter (Signed)
Pt called stated that she left a msg on our machine on Sunday but we weren't in the office that her systolic bp went up to 0000000 and she was concerned that Tommy Medal told her if it was elevated over 140 to call her immediately. Pt said she took extra bp pill and would like a call back from kristin so I will route this to the [facility]

## 2019-03-08 NOTE — Telephone Encounter (Signed)
Spoke with patient.  She states that on Sunday her BP went up as high as 0000000 systolic.  She states she felt weak, dizzy and "hot".  Called EMS as office was closed.  Ended up taking extra 2.5 mg dose of amlodipine.    Patient notes that her BP had been fine since seen in office in November, this was the first time to see > [id number] systolic.  She cannot recall what she ate on Saturday other than a [employer] meal.  Suspect that her elevated pressure may have been due to increased sodium intake for 1-2 days prior.  Advised that she take another 2.5 mg amlodipine again today, then go back to her 2.5 mg once daily dose.    Patient voiced understanding.

## 2019-03-16 ENCOUNTER — Telehealth: Payer: Self-pay | Admitting: Pharmacist Clinician (PhC)/ Clinical Pharmacy Specialist

## 2019-03-16 MED ORDER — AMLODIPINE BESYLATE 5 MG PO TABS: 5.0000 mg | ORAL_TABLET | Freq: Every day | ORAL | 3 refills | Status: DC

## 2019-03-16 NOTE — Telephone Encounter (Signed)
Patient called to report that she has increased amlodipine to 5 mg daily.  At her last visit BP was good, however we discontinued hctz due to problems with hyponatremia.  She states that her pressure slowly increased to [id number] systolic since then.  Pt increased amlodipine to 5 mg daily in the past week and notes no problems, BP dropping back to normal ranges.    Will send updated rx to pharmacy.

## 2019-03-23 ENCOUNTER — Other Ambulatory Visit: Payer: Self-pay | Admitting: Gastroenterology

## 2019-03-23 DIAGNOSIS — R197 Diarrhea, unspecified: Secondary | ICD-10-CM

## 2019-03-23 DIAGNOSIS — R1084 Generalized abdominal pain: Secondary | ICD-10-CM

## 2019-03-23 DIAGNOSIS — K5732 Diverticulitis of large intestine without perforation or abscess without bleeding: Secondary | ICD-10-CM

## 2019-03-25 ENCOUNTER — Telehealth: Payer: Self-pay | Admitting: Pharmacist Clinician (PhC)/ Clinical Pharmacy Specialist

## 2019-03-25 NOTE — Telephone Encounter (Signed)
Pt called, concerned about what dose of BP med to take.  She was on amlodipine 2.5 mg daily, but back before Christmas was asked to increase the dose to 5 mg x 1 day then resume 2.5 mg daily.  She called back about 10 days later to state that she was taking 5 mg daily and her BP was now mostly in normal range. She reported no side effects.  Advised that she could continue with this dose.  Today she calls to state that the 5 mg dose is too much and she cut it back to 2.5 a day or two after she had called.  Home BP readings still [id number] systolic on daily basis since then.  Advised she can continue with 2.5 mg dose.    Pt also wanted to know if we were giving out COVID vaccine.  She was told it would be available at the [facility], but she doesn't want to go inside there.  Would prefer to come here.  Advised that we won't have the vaccine and she should just go there.

## 2019-04-02 ENCOUNTER — Ambulatory Visit
Admission: RE | Admit: 2019-04-02 | Discharge: 2019-04-02 | Disposition: A | Discharge status: Home / Self Care | Payer: Medicare Other | Source: Ambulatory Visit | Attending: Gastroenterology | Admitting: Gastroenterology

## 2019-04-02 ENCOUNTER — Other Ambulatory Visit: Payer: Self-pay | Admitting: Cardiology

## 2019-04-02 ENCOUNTER — Other Ambulatory Visit: Payer: Self-pay

## 2019-04-02 DIAGNOSIS — K5732 Diverticulitis of large intestine without perforation or abscess without bleeding: Secondary | ICD-10-CM

## 2019-04-02 DIAGNOSIS — R197 Diarrhea, unspecified: Secondary | ICD-10-CM

## 2019-04-02 DIAGNOSIS — R1084 Generalized abdominal pain: Secondary | ICD-10-CM

## 2019-04-02 DIAGNOSIS — K449 Diaphragmatic hernia without obstruction or gangrene: Secondary | ICD-10-CM | POA: Diagnosis not present

## 2019-04-02 DIAGNOSIS — K5730 Diverticulosis of large intestine without perforation or abscess without bleeding: Secondary | ICD-10-CM | POA: Diagnosis not present

## 2019-04-02 IMAGING — CT CT ABD-PELV W/ CM
1 of 3 series · 13 of 32 positions shown, 18 images · IV contrast (APPLIED)
Comparison: Noncontrast CT on [DATE]

CLINICAL DATA: Paraumbilical abdominal pain for 2 months.
Alternating episodes of diarrhea and constipation.

EXAM:
CT ABDOMEN AND PELVIS WITH CONTRAST
TECHNIQUE: Multidetector CT imaging of the abdomen and pelvis was performed
using the standard protocol following bolus administration of
intravenous contrast.
CONTRAST:  100mL [AL] IOPAMIDOL ([AL]) INJECTION 61%

[Series 2: abd/pelvis w/cm · axial · 0.67mm/px · z∈[-444,-94]mm · 13 of 82 slices shown, 18 images]
[im 6/82  soft-tissue]
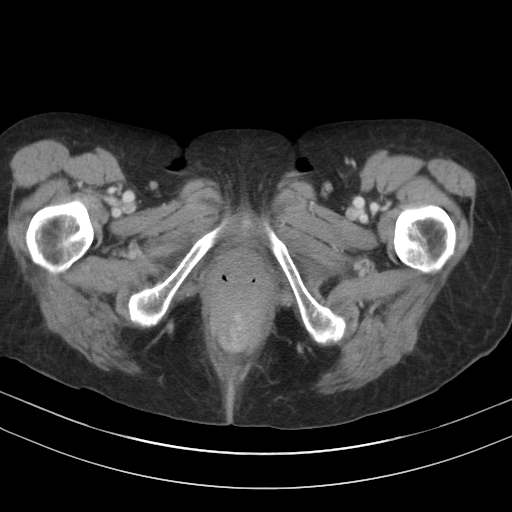
[im 6/82  bone]
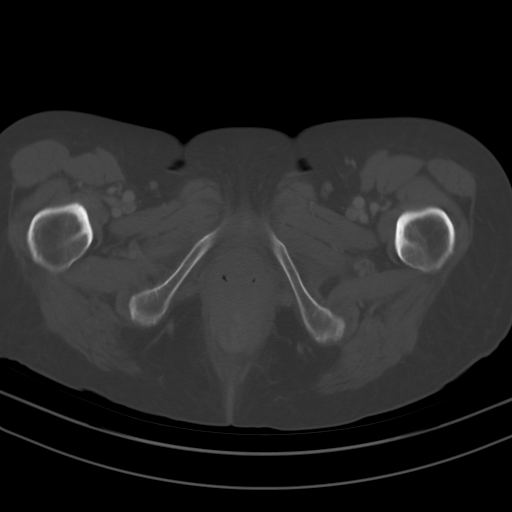
[im 11/82  soft-tissue]
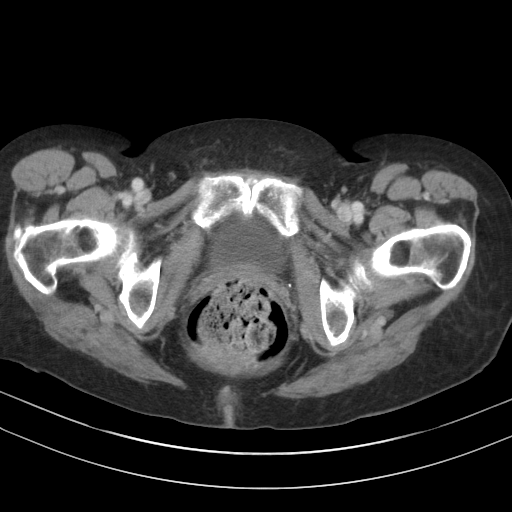
[im 21/82  soft-tissue]
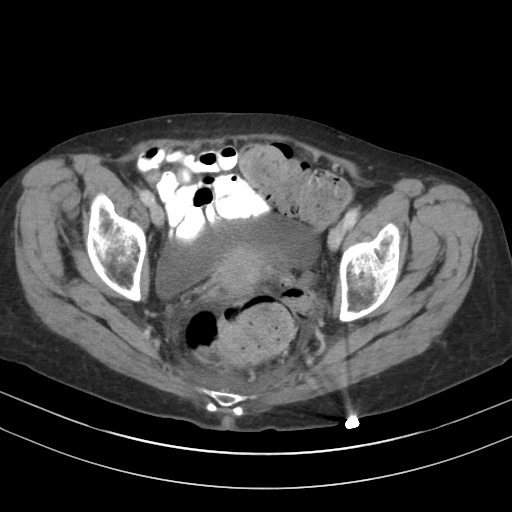
[im 26/82  soft-tissue]
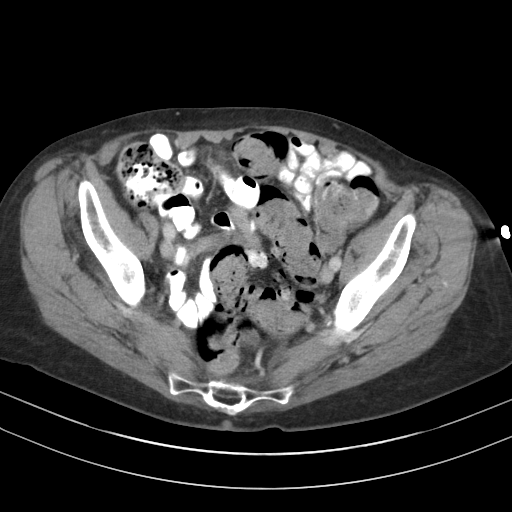
[im 31/82  soft-tissue]
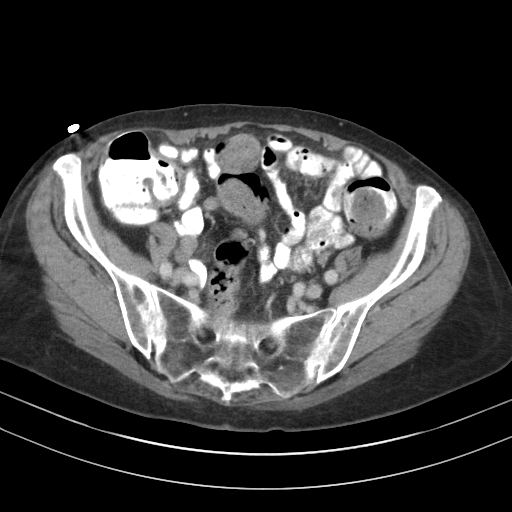
[im 36/82  soft-tissue]
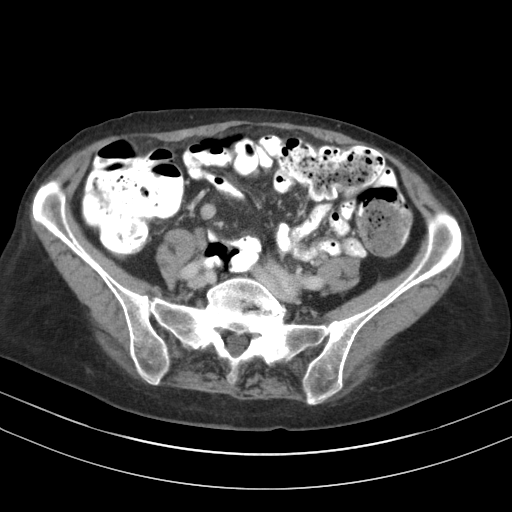
[im 46/82  soft-tissue]
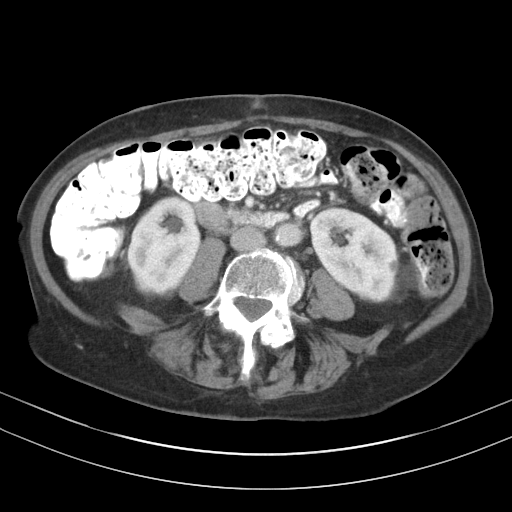
[im 51/82  soft-tissue]
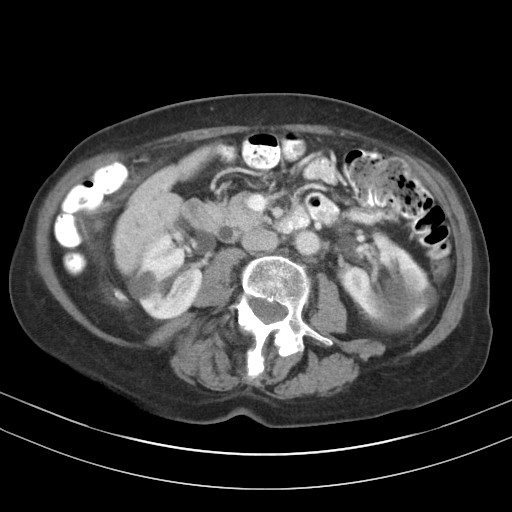
[im 56/82  soft-tissue]
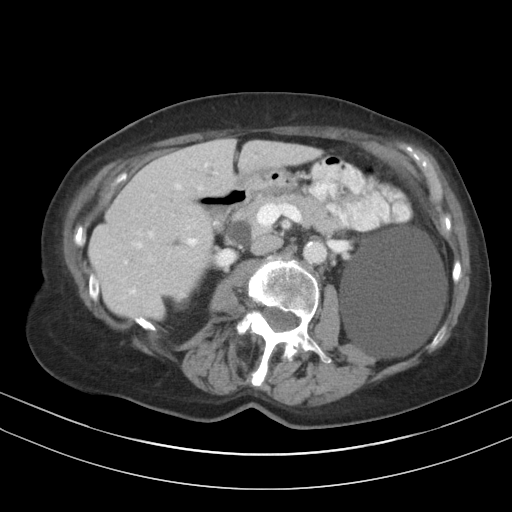
[im 56/82  bone]
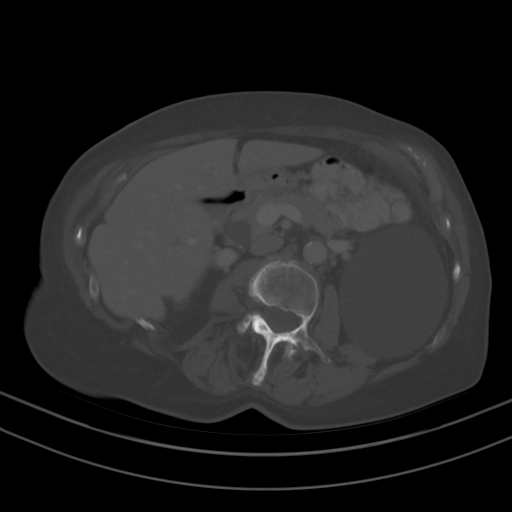
[im 61/82  soft-tissue]
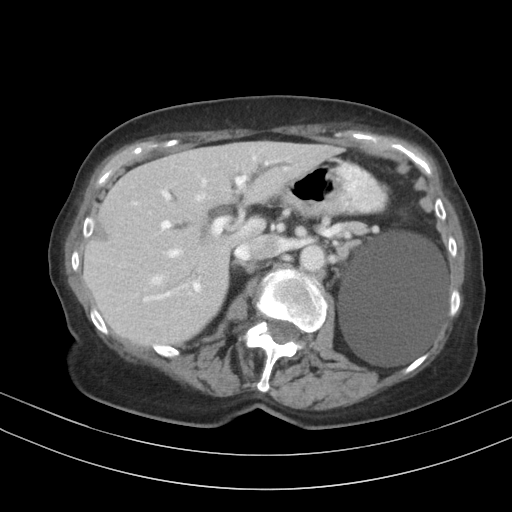
[im 61/82  lung]
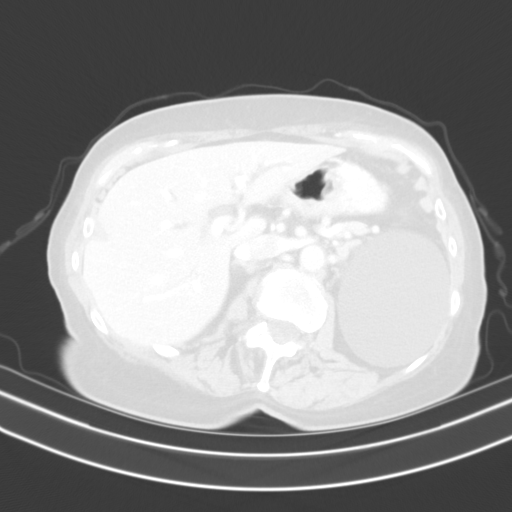
[im 66/82  lung]
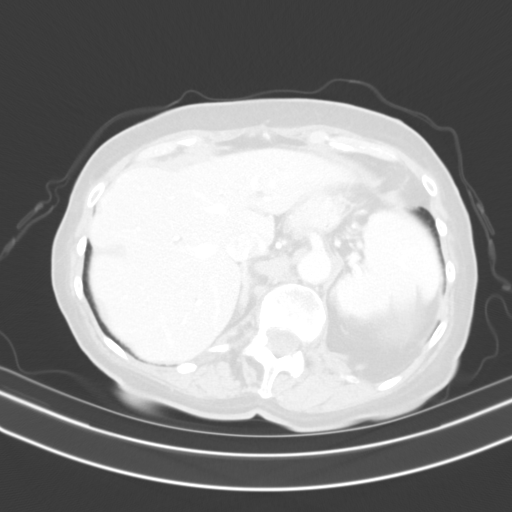
[im 71/82  soft-tissue]
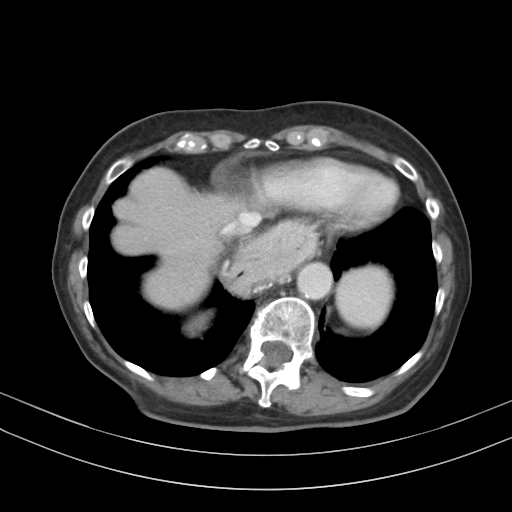
[im 71/82  lung]
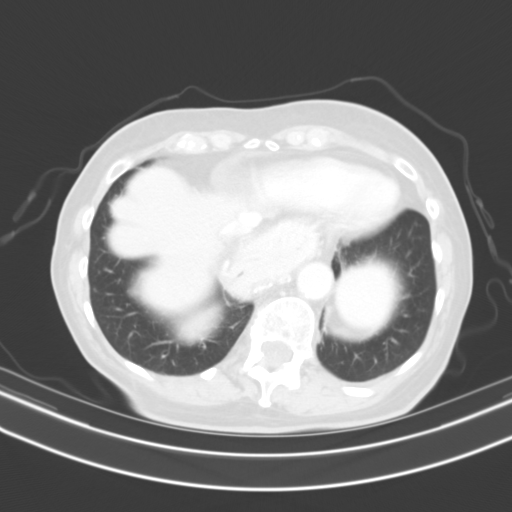
[im 76/82  soft-tissue]
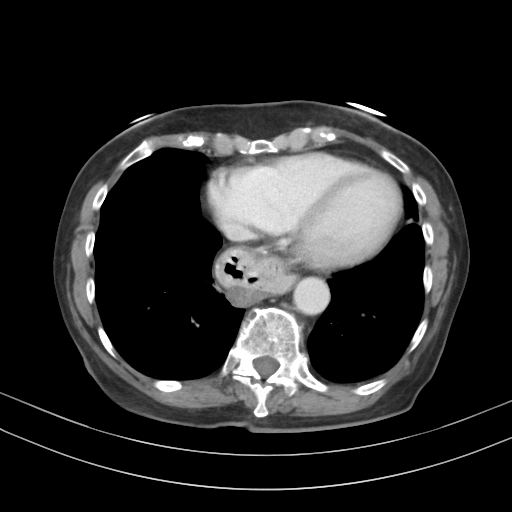
[im 76/82  lung]
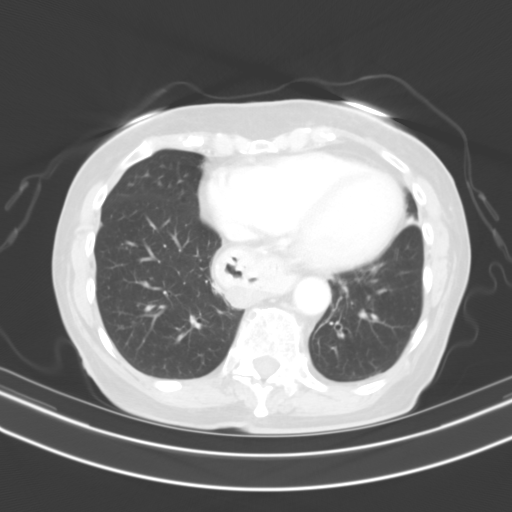

[13 of 32 positions shown; findings below may reference images not displayed]

FINDINGS: Lower Chest: No acute findings.

Hepatobiliary: No hepatic masses identified. Prior cholecystectomy
again noted. Diffuse biliary ductal dilatation is again seen.

Pancreas: No mass or inflammatory changes. No evidence of pancreatic
ductal dilatation.

Spleen: Within normal limits in size and appearance.

Adrenals/Urinary Tract: Renal cysts are again seen bilaterally,
largest in the left kidney measuring 9.4 cm. A subcapsular lesion is
seen in the posterior midpole of the left kidney which measures
cm and shows internal area of high attenuation. This was not seen on
previous study, and could represent a hemorrhagic cyst or enhancing
renal mass. No evidence of ureteral calculi or hydronephrosis.
Unremarkable unopacified urinary bladder.

Stomach/Bowel: Moderate size hiatal hernia is again seen. No
evidence of obstruction, inflammatory process or abnormal fluid
collections. Diverticulosis is seen mainly involving the sigmoid
colon, however there is no evidence of diverticulitis. Large stool
burden is seen throughout the colon.

Vascular/Lymphatic: No pathologically enlarged lymph nodes. No
abdominal aortic aneurysm. Aortic atherosclerosis incidentally
noted.

Reproductive:  No mass or other significant abnormality.

Other:  None.

Musculoskeletal:  No suspicious bone lesions identified.
IMPRESSION: 1. Moderate hiatal hernia.
2. Colonic diverticulosis, without radiographic evidence of
diverticulitis.
3. Large stool burden noted; recommend clinical correlation for
possible constipation.
4. 1.2 cm indeterminate lesion in left kidney, which could represent
a hemorrhagic cyst or enhancing renal mass. Abdomen MRI without and
with contrast is recommended for further characterization.

Aortic Atherosclerosis ([AL]-[AL]).

## 2019-04-02 MED ORDER — IOPAMIDOL (ISOVUE-300) INJECTION 61%
100.0000 mL | Freq: Once | INTRAVENOUS | Status: AC | PRN
  Administered 2019-04-02: 100 mL via INTRAVENOUS

## 2019-04-07 ENCOUNTER — Ambulatory Visit: Payer: Medicare Other | Attending: Internal Medicine

## 2019-04-07 DIAGNOSIS — Z23 Encounter for immunization: Secondary | ICD-10-CM | POA: Diagnosis not present

## 2019-04-07 NOTE — Progress Notes (Signed)
   Covid-19 Vaccination Clinic  Name:  Danielle Hebert    MRN: [id number] DOB: 06-07-1934  04/07/2019  Danielle Hebert was observed post Covid-19 immunization for 15 minutes without incidence. She was provided with Vaccine Information Sheet and instruction to access the V-Safe system.   Danielle Hebert was instructed to call 911 with any severe reactions post vaccine: Marland Kitchen Difficulty breathing  . Swelling of your face and throat  . A fast heartbeat  . A bad rash all over your body  . Dizziness and weakness    Immunizations Administered    Name Date Dose VIS Date Route   Pfizer COVID-19 Vaccine 04/07/2019 11:44 AM 0.3 mL 02/26/2019 Intramuscular   Manufacturer: [place]   Lot: [id number]   [place]: [id number]

## 2019-04-09 ENCOUNTER — Other Ambulatory Visit: Payer: Self-pay | Admitting: Gastroenterology

## 2019-04-09 DIAGNOSIS — R935 Abnormal findings on diagnostic imaging of other abdominal regions, including retroperitoneum: Secondary | ICD-10-CM

## 2019-04-13 DIAGNOSIS — Z79899 Other long term (current) drug therapy: Secondary | ICD-10-CM | POA: Diagnosis not present

## 2019-04-13 DIAGNOSIS — M0589 Other rheumatoid arthritis with rheumatoid factor of multiple sites: Secondary | ICD-10-CM | POA: Diagnosis not present

## 2019-04-16 DIAGNOSIS — R9389 Abnormal findings on diagnostic imaging of other specified body structures: Secondary | ICD-10-CM | POA: Diagnosis not present

## 2019-04-16 DIAGNOSIS — N2889 Other specified disorders of kidney and ureter: Secondary | ICD-10-CM | POA: Diagnosis not present

## 2019-04-16 DIAGNOSIS — K5900 Constipation, unspecified: Secondary | ICD-10-CM | POA: Diagnosis not present

## 2019-04-20 ENCOUNTER — Ambulatory Visit
Admission: RE | Admit: 2019-04-20 | Discharge: 2019-04-20 | Disposition: A | Discharge status: Home / Self Care | Payer: Medicare Other | Source: Ambulatory Visit | Attending: Gastroenterology | Admitting: Gastroenterology

## 2019-04-20 DIAGNOSIS — R935 Abnormal findings on diagnostic imaging of other abdominal regions, including retroperitoneum: Secondary | ICD-10-CM

## 2019-04-20 DIAGNOSIS — N2889 Other specified disorders of kidney and ureter: Secondary | ICD-10-CM | POA: Diagnosis not present

## 2019-04-20 IMAGING — MR MR ABDOMEN WO/W CM
17 series · 48 of 48 positions shown · IV contrast (12 ML MULTIHANCE)
Comparison: CT [DATE] for

CLINICAL DATA: Indeterminate left renal lesion.

EXAM:
MRI ABDOMEN WITHOUT AND WITH CONTRAST
TECHNIQUE: Multiplanar multisequence MR imaging of the abdomen was performed
both before and after the administration of intravenous contrast.
CONTRAST:  12mL MULTIHANCE GADOBENATE DIMEGLUMINE 529 MG/ML IV SOLN

[Series 4: T2 · axial · 5.0mm · 1.48mm/px · z∈[-118,+104]mm · 2 of 38 slices shown (1 of 3)]
[im 1/38]
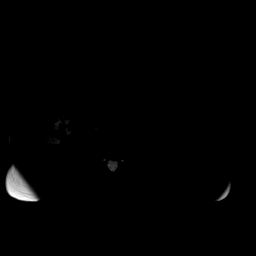
[im 38/38]
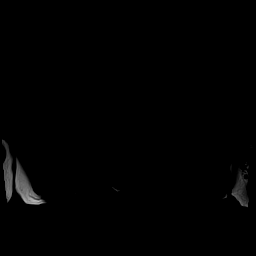

[Series 5: DWI · axial · 5.0mm · 1.42mm/px · z∈[-118,+104]mm · 5 of 114 slices shown (1 of 2)]
[im 1/114]
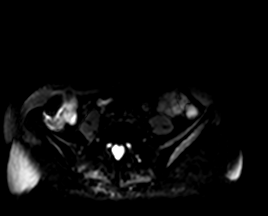
[im 29/114]
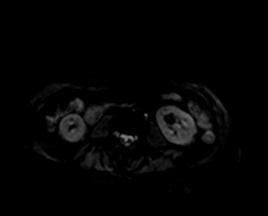
[im 57/114]
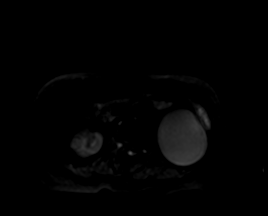
[im 85/114]
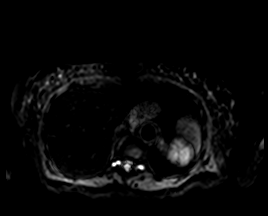
[im 114/114]
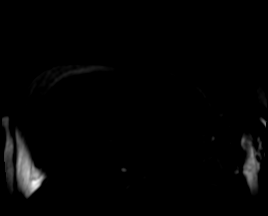

[Series 6: DWI · axial · 5.0mm · 1.42mm/px · z∈[-118,+104]mm · 2 of 38 slices shown (2 of 2)]
[im 1/38]
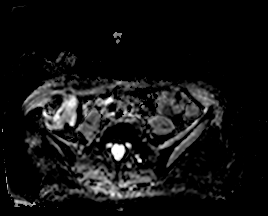
[im 38/38]
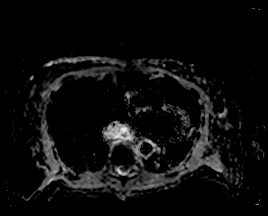

[Series 7: T2 · coronal · 5.0mm · 1.56mm/px · 1 of 28 slices shown (2 of 3)]
[im 1/28]
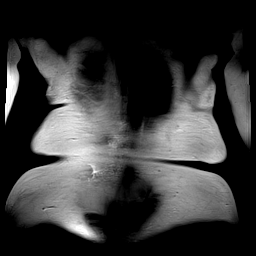

[Series 9: T2 · axial · 6.0mm · 1.22mm/px · 1 of 30 slices shown (3 of 3)]
[im 1/30]
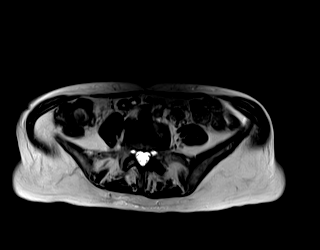

[Series 11: T1 · axial · 3.0mm · 1.19mm/px · z∈[-133,+80]mm · 6 of 144 slices shown]
[im 1/144]
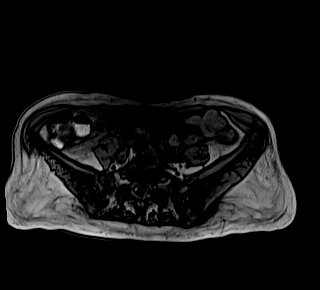
[im 29/144]
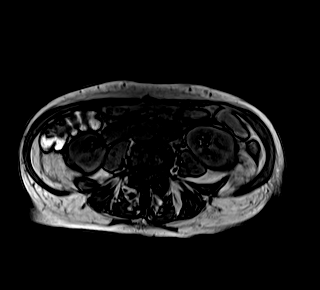
[im 58/144]
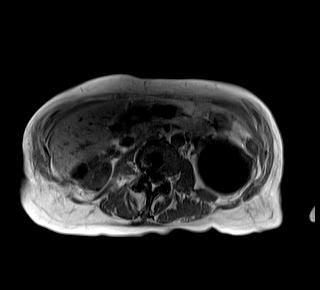
[im 86/144]
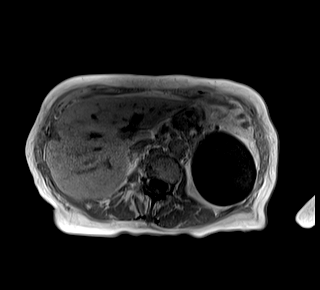
[im 115/144]
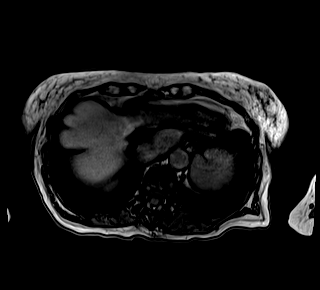
[im 144/144]
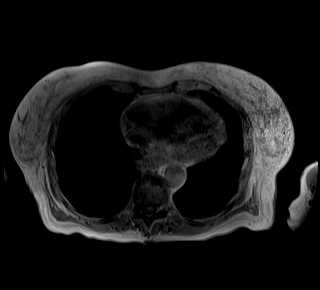

[Series 12: bSSFP · axial · 5.0mm · 1.25mm/px · z∈[-138,+84]mm · 2 of 38 slices shown]
[im 1/38]
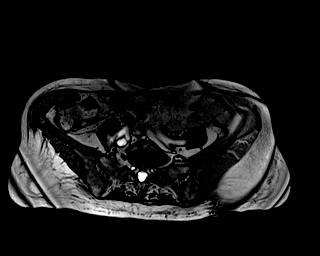
[im 38/38]
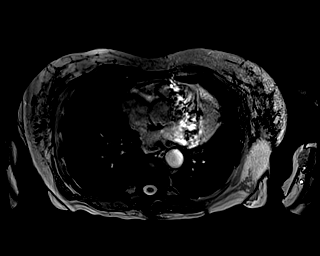

[Series 13: T1 dynamic · axial · non-contrast · 3.0mm · 1.25mm/px · z∈[-140,+73]mm · 3 of 72 slices shown]
[im 1/72]
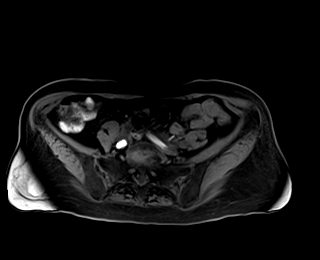
[im 36/72]
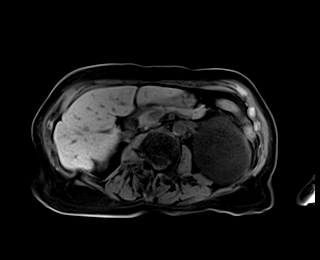
[im 72/72]
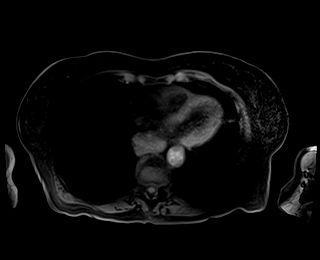

[Series 14: T1 dynamic post-contrast · axial · 3.0mm · 1.25mm/px · z∈[-140,+73]mm · 3 of 72 slices shown (1 of 9)]
[im 1/72]
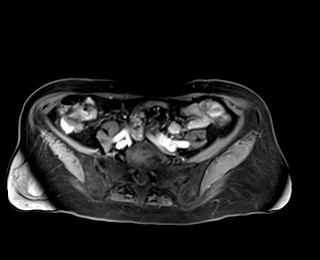
[im 36/72]
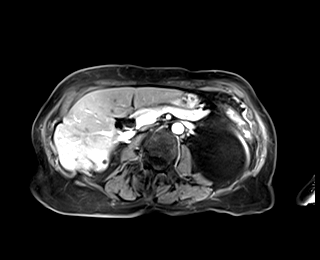
[im 72/72]
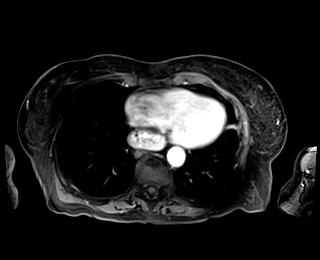

[Series 15: T1 dynamic post-contrast · axial · 3.0mm · 1.25mm/px · z∈[-140,+73]mm · 3 of 72 slices shown (2 of 9)]
[im 1/72]
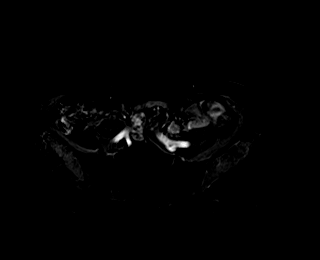
[im 36/72]
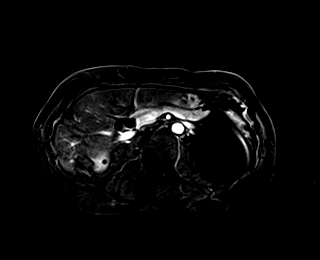
[im 72/72]
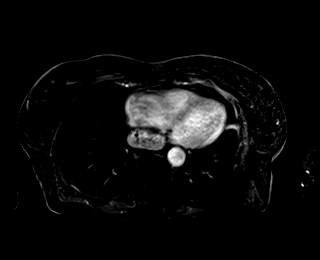

[Series 16: T1 dynamic post-contrast · axial · 3.0mm · 1.25mm/px · z∈[-140,+73]mm · 3 of 72 slices shown (3 of 9)]
[im 1/72]
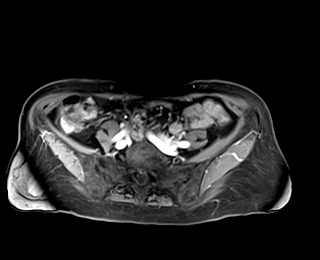
[im 36/72]
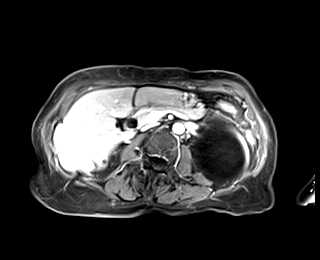
[im 72/72]
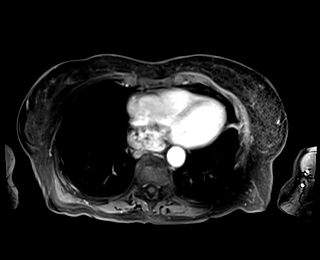

[Series 17: T1 dynamic post-contrast · axial · 3.0mm · 1.25mm/px · z∈[-140,+73]mm · 3 of 72 slices shown (4 of 9)]
[im 1/72]
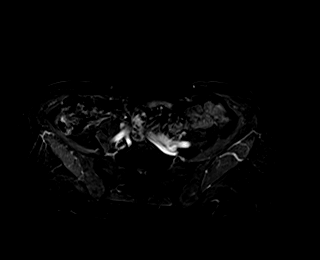
[im 36/72]
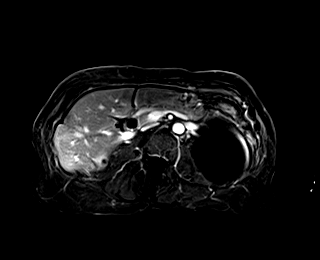
[im 72/72]
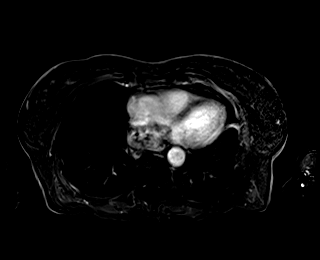

[Series 18: T1 dynamic post-contrast · axial · 3.0mm · 1.25mm/px · z∈[-140,+73]mm · 3 of 72 slices shown (5 of 9)]
[im 1/72]
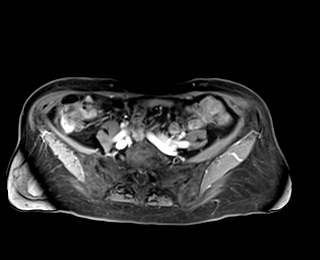
[im 36/72]
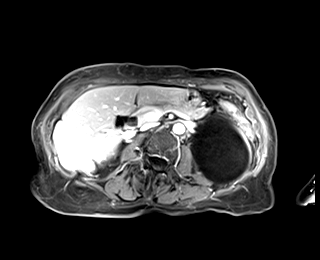
[im 72/72]
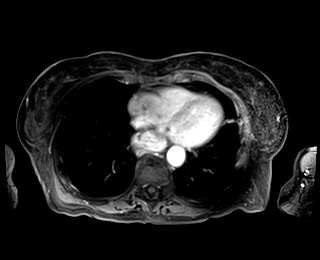

[Series 19: T1 dynamic post-contrast · axial · 3.0mm · 1.25mm/px · z∈[-140,+73]mm · 3 of 72 slices shown (6 of 9)]
[im 1/72]
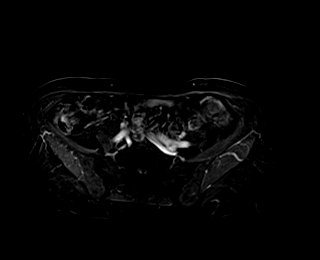
[im 36/72]
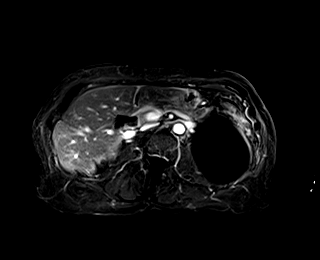
[im 72/72]
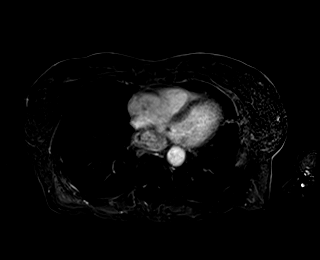

[Series 20: T1 dynamic post-contrast · coronal · 3.0mm · 1.25mm/px · 2 of 56 slices shown (7 of 9)]
[im 1/56]
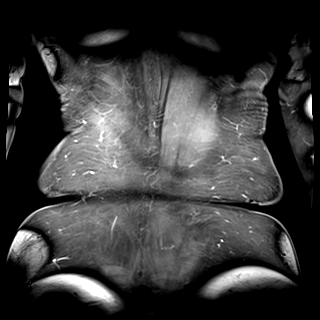
[im 56/56]
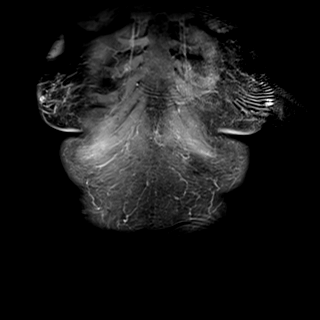

[Series 21: T1 dynamic post-contrast · axial · 3.0mm · 1.25mm/px · z∈[-140,+73]mm · 3 of 72 slices shown (8 of 9)]
[im 1/72]
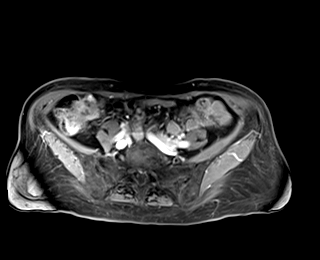
[im 36/72]
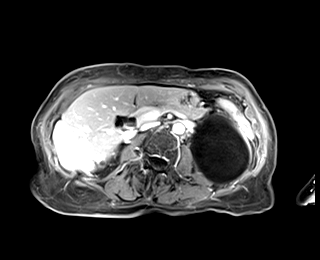
[im 72/72]
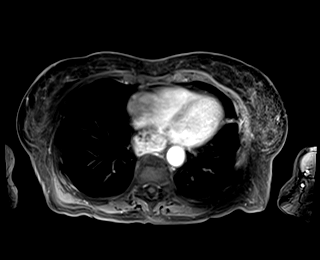

[Series 22: T1 dynamic post-contrast · axial · 3.0mm · 1.25mm/px · z∈[-140,+73]mm · 3 of 72 slices shown (9 of 9)]
[im 1/72]
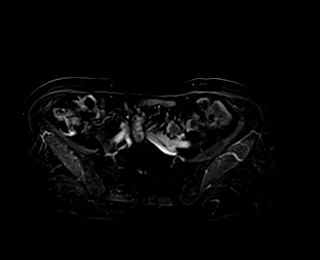
[im 36/72]
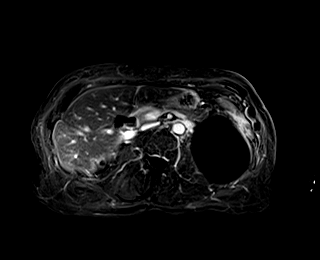
[im 72/72]
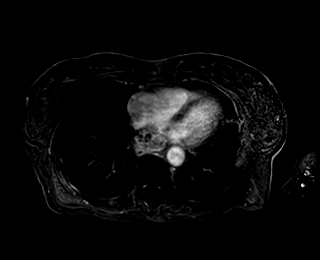

[48 of 48 positions shown; findings below may reference images not displayed]

FINDINGS: Portions of exam are minimally motion degraded.

Lower chest: Normal heart size without pericardial or pleural
effusion. Moderate hiatal hernia.

Hepatobiliary: Arterial phase vague hyperenhancement within the
inferior right hepatic lobe on 45/4 measures up to 2.0 cm, without
correlate on other pulse sequences and therefore likely a perfusion
anomaly. No suspicious liver lesion. Cholecystectomy. The common
duct measures up to 11 mm on [DATE]. Similar to on the most recent
exam. No obstructive stone or mass.

Pancreas:  Normal, without mass or ductal dilatation.

Spleen:  Normal in size, without focal abnormality.

Adrenals/Urinary Tract: Normal adrenal glands. Bilateral renal
cysts, including an upper pole dominant 8.6 cm lesion.

Posterior interpolar right renal lesion measures 7 mm and
demonstrates precontrast T1 hyperintensity on 52/13. No
post-contrast enhancement, including on subtracted images.

Corresponding to the CT abnormality, within the posterior interpolar
left kidney, is a 1.1 cm lesion. This demonstrates dependent T2
hypointensity on [DATE]. The T1 hyperintensity in this area is only
vaguely apparent, felt to be due to motion on the precontrast T1
weighted exams. Apparent increased signal within the dependent
portion of this lesion including on subtracted images (example
48/19), favored to be artifactual and due to precontrast motion,
rather than enhancement.

No hydronephrosis.

Stomach/Bowel: Normal abdominal bowel loops.

Vascular/Lymphatic: Aortic atherosclerosis. Patent renal veins. No
abdominal adenopathy.

Other:  No ascites.

Musculoskeletal: No acute osseous abnormality. Mild S-shaped
thoracolumbar spine curvature.
IMPRESSION: 1. The interpolar left renal lesion of concern is favored to
represent a hemorrhagic cyst with proteinaceous debris layering
dependently. Secondary to minimal motion degradation, most apparent
on the precontrast exams, this is technically indeterminate.
Therefore, recommend follow-up with pre and post contrast abdominal
MRI at 1 year. Alternatively, renal ultrasound size surveillance
could be performed.
2. Moderate hiatal hernia.
3. Bilateral renal cysts with an interpolar right renal
hemorrhagic/proteinaceous cyst.
4.  Aortic Atherosclerosis ([Z5]-[Z5]).
5. Cholecystectomy with minimal biliary duct dilatation. Presuming
the bilirubin is normal, this is likely within normal variation.

## 2019-04-20 MED ORDER — GADOBENATE DIMEGLUMINE 529 MG/ML IV SOLN
12.0000 mL | Freq: Once | INTRAVENOUS | Status: AC | PRN
  Administered 2019-04-20: 12 mL via INTRAVENOUS

## 2019-04-28 ENCOUNTER — Ambulatory Visit: Payer: Medicare Other

## 2019-04-28 ENCOUNTER — Ambulatory Visit: Payer: Medicare Other | Attending: Internal Medicine

## 2019-04-28 DIAGNOSIS — Z23 Encounter for immunization: Secondary | ICD-10-CM | POA: Insufficient documentation

## 2019-04-28 NOTE — Progress Notes (Signed)
   Covid-19 Vaccination Clinic  Name:  Danielle Hebert    MRN: [id number] DOB: 1934-12-15  04/28/2019  Danielle Hebert was observed post Covid-19 immunization for 15 minutes without incidence. She was provided with Vaccine Information Sheet and instruction to access the V-Safe system.   Danielle Hebert was instructed to call 911 with any severe reactions post vaccine: Marland Kitchen Difficulty breathing  . Swelling of your face and throat  . A fast heartbeat  . A bad rash all over your body  . Dizziness and weakness    Immunizations Administered    Name Date Dose VIS Date Route   Pfizer COVID-19 Vaccine 04/28/2019  4:30 PM 0.3 mL 02/26/2019 Intramuscular   Manufacturer: [place]   Lot: [id number]   [place]: [id number]

## 2019-05-06 ENCOUNTER — Other Ambulatory Visit: Payer: Medicare Other

## 2019-05-11 DIAGNOSIS — Z09 Encounter for follow-up examination after completed treatment for conditions other than malignant neoplasm: Secondary | ICD-10-CM | POA: Diagnosis not present

## 2019-05-11 DIAGNOSIS — R194 Change in bowel habit: Secondary | ICD-10-CM | POA: Diagnosis not present

## 2019-06-08 DIAGNOSIS — M0589 Other rheumatoid arthritis with rheumatoid factor of multiple sites: Secondary | ICD-10-CM | POA: Diagnosis not present

## 2019-06-10 DIAGNOSIS — M0589 Other rheumatoid arthritis with rheumatoid factor of multiple sites: Secondary | ICD-10-CM | POA: Diagnosis not present

## 2019-06-10 DIAGNOSIS — M8580 Other specified disorders of bone density and structure, unspecified site: Secondary | ICD-10-CM | POA: Diagnosis not present

## 2019-06-10 DIAGNOSIS — Z6822 Body mass index (BMI) 22.0-22.9, adult: Secondary | ICD-10-CM | POA: Diagnosis not present

## 2019-06-10 DIAGNOSIS — M25551 Pain in right hip: Secondary | ICD-10-CM | POA: Diagnosis not present

## 2019-06-10 DIAGNOSIS — M150 Primary generalized (osteo)arthritis: Secondary | ICD-10-CM | POA: Diagnosis not present

## 2019-06-24 ENCOUNTER — Other Ambulatory Visit: Payer: Self-pay | Admitting: Cardiovascular Disease

## 2019-08-03 DIAGNOSIS — M0589 Other rheumatoid arthritis with rheumatoid factor of multiple sites: Secondary | ICD-10-CM | POA: Diagnosis not present

## 2019-09-29 DIAGNOSIS — F321 Major depressive disorder, single episode, moderate: Secondary | ICD-10-CM | POA: Diagnosis not present

## 2019-09-29 DIAGNOSIS — R1084 Generalized abdominal pain: Secondary | ICD-10-CM | POA: Diagnosis not present

## 2019-09-29 DIAGNOSIS — R634 Abnormal weight loss: Secondary | ICD-10-CM | POA: Diagnosis not present

## 2019-09-29 DIAGNOSIS — E785 Hyperlipidemia, unspecified: Secondary | ICD-10-CM | POA: Diagnosis not present

## 2019-09-29 DIAGNOSIS — I10 Essential (primary) hypertension: Secondary | ICD-10-CM | POA: Diagnosis not present

## 2019-09-29 DIAGNOSIS — R5382 Chronic fatigue, unspecified: Secondary | ICD-10-CM | POA: Diagnosis not present

## 2019-09-29 DIAGNOSIS — Z0000 Encounter for general adult medical examination without abnormal findings: Secondary | ICD-10-CM | POA: Diagnosis not present

## 2019-09-29 DIAGNOSIS — I4891 Unspecified atrial fibrillation: Secondary | ICD-10-CM | POA: Diagnosis not present

## 2019-10-18 DIAGNOSIS — M6281 Muscle weakness (generalized): Secondary | ICD-10-CM | POA: Diagnosis not present

## 2019-10-18 DIAGNOSIS — R2689 Other abnormalities of gait and mobility: Secondary | ICD-10-CM | POA: Diagnosis not present

## 2019-10-20 DIAGNOSIS — R2689 Other abnormalities of gait and mobility: Secondary | ICD-10-CM | POA: Diagnosis not present

## 2019-10-20 DIAGNOSIS — M6281 Muscle weakness (generalized): Secondary | ICD-10-CM | POA: Diagnosis not present

## 2019-10-25 DIAGNOSIS — R2689 Other abnormalities of gait and mobility: Secondary | ICD-10-CM | POA: Diagnosis not present

## 2019-10-25 DIAGNOSIS — M6281 Muscle weakness (generalized): Secondary | ICD-10-CM | POA: Diagnosis not present

## 2019-10-27 DIAGNOSIS — M6281 Muscle weakness (generalized): Secondary | ICD-10-CM | POA: Diagnosis not present

## 2019-10-27 DIAGNOSIS — R2689 Other abnormalities of gait and mobility: Secondary | ICD-10-CM | POA: Diagnosis not present

## 2019-11-01 DIAGNOSIS — M6281 Muscle weakness (generalized): Secondary | ICD-10-CM | POA: Diagnosis not present

## 2019-11-01 DIAGNOSIS — R2689 Other abnormalities of gait and mobility: Secondary | ICD-10-CM | POA: Diagnosis not present

## 2019-11-02 DIAGNOSIS — L570 Actinic keratosis: Secondary | ICD-10-CM | POA: Diagnosis not present

## 2019-11-02 DIAGNOSIS — L719 Rosacea, unspecified: Secondary | ICD-10-CM | POA: Diagnosis not present

## 2019-11-02 DIAGNOSIS — S0083XA Contusion of other part of head, initial encounter: Secondary | ICD-10-CM | POA: Diagnosis not present

## 2019-11-05 DIAGNOSIS — R2689 Other abnormalities of gait and mobility: Secondary | ICD-10-CM | POA: Diagnosis not present

## 2019-11-05 DIAGNOSIS — M6281 Muscle weakness (generalized): Secondary | ICD-10-CM | POA: Diagnosis not present

## 2019-11-09 DIAGNOSIS — H52221 Regular astigmatism, right eye: Secondary | ICD-10-CM | POA: Diagnosis not present

## 2019-11-09 DIAGNOSIS — Z961 Presence of intraocular lens: Secondary | ICD-10-CM | POA: Diagnosis not present

## 2019-11-09 DIAGNOSIS — H35033 Hypertensive retinopathy, bilateral: Secondary | ICD-10-CM | POA: Diagnosis not present

## 2019-11-09 DIAGNOSIS — H524 Presbyopia: Secondary | ICD-10-CM | POA: Diagnosis not present

## 2019-11-09 DIAGNOSIS — H0100A Unspecified blepharitis right eye, upper and lower eyelids: Secondary | ICD-10-CM | POA: Diagnosis not present

## 2019-11-09 DIAGNOSIS — H5212 Myopia, left eye: Secondary | ICD-10-CM | POA: Diagnosis not present

## 2019-11-09 DIAGNOSIS — H52222 Regular astigmatism, left eye: Secondary | ICD-10-CM | POA: Diagnosis not present

## 2019-11-09 DIAGNOSIS — H40053 Ocular hypertension, bilateral: Secondary | ICD-10-CM | POA: Diagnosis not present

## 2019-11-15 DIAGNOSIS — M6281 Muscle weakness (generalized): Secondary | ICD-10-CM | POA: Diagnosis not present

## 2019-11-15 DIAGNOSIS — R2689 Other abnormalities of gait and mobility: Secondary | ICD-10-CM | POA: Diagnosis not present

## 2019-11-23 DIAGNOSIS — M0589 Other rheumatoid arthritis with rheumatoid factor of multiple sites: Secondary | ICD-10-CM | POA: Diagnosis not present

## 2019-11-23 DIAGNOSIS — Z79899 Other long term (current) drug therapy: Secondary | ICD-10-CM | POA: Diagnosis not present

## 2019-12-14 DIAGNOSIS — M0589 Other rheumatoid arthritis with rheumatoid factor of multiple sites: Secondary | ICD-10-CM | POA: Diagnosis not present

## 2019-12-14 DIAGNOSIS — M8580 Other specified disorders of bone density and structure, unspecified site: Secondary | ICD-10-CM | POA: Diagnosis not present

## 2019-12-14 DIAGNOSIS — Z6823 Body mass index (BMI) 23.0-23.9, adult: Secondary | ICD-10-CM | POA: Diagnosis not present

## 2019-12-14 DIAGNOSIS — N1831 Chronic kidney disease, stage 3a: Secondary | ICD-10-CM | POA: Diagnosis not present

## 2019-12-14 DIAGNOSIS — M150 Primary generalized (osteo)arthritis: Secondary | ICD-10-CM | POA: Diagnosis not present

## 2019-12-14 DIAGNOSIS — M25551 Pain in right hip: Secondary | ICD-10-CM | POA: Diagnosis not present

## 2019-12-27 ENCOUNTER — Other Ambulatory Visit: Payer: Self-pay | Admitting: Cardiovascular Disease

## 2019-12-30 ENCOUNTER — Other Ambulatory Visit: Payer: Self-pay | Admitting: Cardiovascular Disease

## 2020-01-03 ENCOUNTER — Other Ambulatory Visit: Payer: Self-pay | Admitting: Cardiovascular Disease

## 2020-01-03 MED ORDER — AMLODIPINE BESYLATE 5 MG PO TABS: 5.0000 mg | ORAL_TABLET | Freq: Every day | ORAL | 3 refills | Status: DC

## 2020-01-03 NOTE — Telephone Encounter (Signed)
Will route call and med request to [facility] pharmacist, for they manage this medication and follow the pt in their clinic.

## 2020-01-03 NOTE — Telephone Encounter (Signed)
Patient calling back stating she is out of the medication and needs the refill done today.

## 2020-01-03 NOTE — Telephone Encounter (Signed)
*  STAT* If patient is at the pharmacy, call can be transferred to refill team.   1. Which medications need to be refilled? (please list name of each medication and dose if known)   amLODipine (NORVASC) 5 MG tablet   2. Which pharmacy/location (including street and city if local pharmacy) is medication to be sent to? [place], [place]  3. Do they need a 30 day or 90 day supply? [place]

## 2020-01-04 ENCOUNTER — Other Ambulatory Visit: Payer: Self-pay

## 2020-01-04 MED ORDER — AMLODIPINE BESYLATE 2.5 MG PO TABS: 2.5000 mg | ORAL_TABLET | Freq: Every day | ORAL | 1 refills | Status: DC

## 2020-01-05 ENCOUNTER — Telehealth: Payer: Self-pay | Admitting: Cardiovascular Disease

## 2020-01-05 NOTE — Telephone Encounter (Signed)
Pt advised that we sent in for the Amlodipine 2.5mg  yesterday and she said there was an RX at the pharmacy for the 2.5 and 5 mg and she was not sure who sent in the 5 mg it may have been her PCP but she says she has been taking the 2.5 mfg daily for a very long time... I advised her to stay with the 2.5 mg until we see her in the office 01/18/20.

## 2020-01-05 NOTE — Telephone Encounter (Signed)
Pt called and stated that someone had called in amlodipine 5 mg instead of 2.5 mg. Wants to know if she should just cut the 5 mg in half or what to do. Please call

## 2020-01-06 DIAGNOSIS — Z23 Encounter for immunization: Secondary | ICD-10-CM | POA: Diagnosis not present

## 2020-01-18 ENCOUNTER — Ambulatory Visit (INDEPENDENT_AMBULATORY_CARE_PROVIDER_SITE_OTHER): Payer: Medicare Other | Admitting: Cardiovascular Disease

## 2020-01-18 ENCOUNTER — Ambulatory Visit: Payer: Medicare Other | Admitting: Cardiovascular Disease

## 2020-01-18 ENCOUNTER — Other Ambulatory Visit: Payer: Self-pay

## 2020-01-18 ENCOUNTER — Encounter: Payer: Self-pay | Admitting: Cardiovascular Disease

## 2020-01-18 ENCOUNTER — Telehealth: Payer: Self-pay | Admitting: Radiology

## 2020-01-18 VITALS — BP 140/84 | HR 85 | Ht 64.0 in | Wt 148.0 lb

## 2020-01-18 DIAGNOSIS — E782 Mixed hyperlipidemia: Secondary | ICD-10-CM

## 2020-01-18 DIAGNOSIS — I480 Paroxysmal atrial fibrillation: Secondary | ICD-10-CM | POA: Diagnosis not present

## 2020-01-18 DIAGNOSIS — I10 Essential (primary) hypertension: Secondary | ICD-10-CM | POA: Diagnosis not present

## 2020-01-18 NOTE — Assessment & Plan Note (Signed)
History of PAF in the setting of being found down with rhabdomyolysis back in 2019.  Converted to sinus rhythm with amiodarone which was since been discontinued and was placed on Eliquis oral anticoagulation.  She has had no recurrent symptoms of A. fib.  I am going to get a 2-week Zio patch.  If this is normal we will discontinue the Eliquis.

## 2020-01-18 NOTE — Assessment & Plan Note (Signed)
History of hyperlipidemia on Zetia with lipid profile performed 09/29/2019 revealing total cholesterol 175, LDL of 98 and HDL 61.

## 2020-01-18 NOTE — Assessment & Plan Note (Signed)
History of essential hypertension a blood pressure measured today 140/84.  She is on amlodipine 2.5 mg a day, hydrochlorothiazide and metoprolol.

## 2020-01-18 NOTE — Progress Notes (Signed)
01/18/2020 [name]   08-Nov-1934  [id number]  Primary Physician Maurice Small, MD Primary Cardiologist: Lorretta Harp MD FACP, [place], [place], [place]  HPI:  Danielle Hebert is a 85 y.o.    widowed Caucasian female mother of 2 living children (one son died of a myocardial infarction at age 40), grandmother to 4 grandchildren who I last spoke to on the phone for a virtual telephone medicine phone visit 03/08/2019..  I initially saw her during her index hospitalization 12/31/2017 when she came in with altered mental status after being found down for 2 days at home she did have rhabdomyolysis, elevated grade troponins which that I did not think were related to ACS and A. fib with RVR.  She had normal LV function.  She was ultimately placed on Eliquis and amiodarone converted to sinus rhythm.  A 2D echo was normal.  She was in sinus rhythm on 01/22/2018 when she saw Kerin Ransom in the office.    Since I spoke to her on the phone a year ago she is done well.  She said no recurrent episodes of A. fib.  She denies chest pain or shortness of breath.  Current Meds  Medication Sig  . ALPRAZolam (XANAX) 0.5 MG tablet Take 0.5 mg by mouth at bedtime.   Marland Kitchen amLODipine (NORVASC) 2.5 MG tablet Take 1 tablet (2.5 mg total) by mouth daily.  . cetirizine (ZYRTEC) 10 MG tablet Take 10 mg by mouth daily as needed for allergies.   . cholecalciferol (VITAMIN D) 1000 UNITS tablet Take 1,000 Units by mouth daily.  . cycloSPORINE (RESTASIS MULTIDOSE) 0.05 % ophthalmic emulsion Place 1 drop into both eyes 2 (two) times daily.  Marland Kitchen ELIQUIS 5 MG TABS tablet Take 1 tablet by mouth twice daily  . fluticasone (FLONASE) 50 MCG/ACT nasal spray Place 1 spray into both nostrils daily.  . folic acid (FOLVITE) 1 MG tablet Take 1 mg by mouth daily.  . hydrochlorothiazide (HYDRODIURIL) 25 MG tablet Take 1 tablet (25 mg total) by mouth daily.  Javier Docker Oil 1000 MG CAPS Take 1,000 mg by mouth daily.  . magnesium oxide  (MAG-OX) 400 MG tablet Take 400 mg by mouth 2 (two) times daily.  . Melatonin-Pyridoxine (MELATIN PO) Take 1 tablet by mouth daily as needed.   . methotrexate 2.5 MG tablet Take 12.5 mg by mouth once a week.  . metoprolol succinate (TOPROL-XL) 25 MG 24 hr tablet Take 1 tablet by mouth once daily  . metroNIDAZOLE (METROCREAM) 0.75 % cream Apply 1 application topically 2 (two) times daily. Apply cream to affected area  . omeprazole (PRILOSEC) 40 MG capsule Take 40 mg by mouth daily.  . ondansetron (ZOFRAN) 4 MG tablet Take 4 mg by mouth every 8 (eight) hours as needed.     Allergies  Allergen Reactions  . Eliquis [Apixaban] Rash  . Xarelto [Rivaroxaban] Rash  . Ciprofloxacin   . Remicade [Infliximab]   . Amiodarone Nausea Only    Social History   Socioeconomic History  . Marital status: Married    Spouse name: Not on file  . Number of children: Not on file  . Years of education: Not on file  . Highest education level: Not on file  Occupational History  . Not on file  Tobacco Use  . Smoking status: Never Smoker  . Smokeless tobacco: Never Used  Substance and Sexual Activity  . Alcohol use: No  . Drug use: No  . Sexual activity: Not on file  Other Topics Concern  . Not on file  Social History Narrative  . Not on file   Social Determinants of Health   Financial Resource Strain:   . Difficulty of Paying Living Expenses: Not on file  Food Insecurity:   . Worried About Charity fundraiser in the Last Year: Not on file  . Ran Out of Food in the Last Year: Not on file  Transportation Needs:   . Lack of Transportation (Medical): Not on file  . Lack of Transportation (Non-Medical): Not on file  Physical Activity:   . Days of Exercise per Week: Not on file  . Minutes of Exercise per Session: Not on file  Stress:   . Feeling of Stress : Not on file  Social Connections:   . Frequency of Communication with Friends and Family: Not on file  . Frequency of Social Gatherings with  Friends and Family: Not on file  . Attends Religious Services: Not on file  . Active Member of Clubs or Organizations: Not on file  . Attends Archivist Meetings: Not on file  . Marital Status: Not on file  Intimate Partner Violence:   . Fear of Current or Ex-Partner: Not on file  . Emotionally Abused: Not on file  . Physically Abused: Not on file  . Sexually Abused: Not on file     Review of Systems: General: negative for chills, fever, night sweats or weight changes.  Cardiovascular: negative for chest pain, dyspnea on exertion, edema, orthopnea, palpitations, paroxysmal nocturnal dyspnea or shortness of breath Dermatological: negative for rash Respiratory: negative for cough or wheezing Urologic: negative for hematuria Abdominal: negative for nausea, vomiting, diarrhea, bright red blood per rectum, melena, or hematemesis Neurologic: negative for visual changes, syncope, or dizziness All other systems reviewed and are otherwise negative except as noted above.    Blood pressure 140/84, pulse 85, height 5\' 4"  (1.626 m), weight 148 lb (67.1 kg).  General appearance: alert and no distress Neck: no adenopathy, no carotid bruit, no JVD, supple, symmetrical, trachea midline and thyroid not enlarged, symmetric, no tenderness/mass/nodules Lungs: clear to auscultation bilaterally Heart: regular rate and rhythm, S1, S2 normal, no murmur, click, rub or gallop Extremities: extremities normal, atraumatic, no cyanosis or edema Pulses: 2+ and symmetric Skin: Skin color, texture, turgor normal. No rashes or lesions Neurologic: Alert and oriented X 3, normal strength and tone. Normal symmetric reflexes. Normal coordination and gait  EKG sinus rhythm at 85 with left axis deviation, occasional PVCs without ST or T wave changes.  There was RSR prime in lead V1 suggesting RV conduction delay.  I personally reviewed this EKG.  ASSESSMENT AND PLAN:   Hyperlipidemia History of  hyperlipidemia on Zetia with lipid profile performed 09/29/2019 revealing total cholesterol 175, LDL of 98 and HDL 61.  Paroxysmal atrial fibrillation (HCC) History of PAF in the setting of being found down with rhabdomyolysis back in 2019.  Converted to sinus rhythm with amiodarone which was since been discontinued and was placed on Eliquis oral anticoagulation.  She has had no recurrent symptoms of A. fib.  I am going to get a 2-week Zio patch.  If this is normal we will discontinue the Eliquis.  Essential hypertension History of essential hypertension a blood pressure measured today 140/84.  She is on amlodipine 2.5 mg a day, hydrochlorothiazide and metoprolol.      Lorretta Harp MD FACP,FACC,FAHA, [facility] 01/18/2020 11:17 AM

## 2020-01-18 NOTE — Patient Instructions (Signed)
Medication Instructions:  Your physician recommends that you continue on your current medications as directed. Please refer to the Current Medication list given to you today.  *If you need a refill on your cardiac medications before your next appointment, please call your pharmacy*   Testing/Procedures:  ZIO XT- Long Term Monitor Instructions   Your physician has requested you wear your ZIO patch monitor 14 days.   This is a single patch monitor.  [employer] supplies one patch monitor per enrollment.  Additional stickers are not available.   Please do not apply patch if you will be having a Nuclear Stress Test, Echocardiogram, Cardiac CT, MRI, or Chest Xray during the time frame you would be wearing the monitor. The patch cannot be worn during these tests.  You cannot remove and re-apply the ZIO XT patch monitor.   Your ZIO patch monitor will be sent USPS Priority mail from [employer] directly to your home address. The monitor may also be mailed to a PO BOX if home delivery is not available.   It may take 3-5 days to receive your monitor after you have been enrolled.   Once you have received you monitor, please review enclosed instructions.  Your monitor has already been registered assigning a specific monitor serial # to you.   Applying the monitor   Shave hair from upper left chest.   Hold abrader disc by orange tab.  Rub abrader in 40 strokes over left upper chest as indicated in your monitor instructions.   Clean area with 4 enclosed alcohol pads .  Use all pads to assure are is cleaned thoroughly.  Let dry.   Apply patch as indicated in monitor instructions.  Patch will be place under collarbone on left side of chest with arrow pointing upward.   Rub patch adhesive wings for 2 minutes.Remove white label marked "1".  Remove white label marked "2".  Rub patch adhesive wings for 2 additional minutes.   While looking in a mirror, press and release button in center of patch.  A  small green light will flash 3-4 times .  This will be your only indicator the monitor has been turned on.     Do not shower for the first 24 hours.  You may shower after the first 24 hours.   Press button if you feel a symptom. You will hear a small click.  Record Date, Time and Symptom in the Patient Log Book.   When you are ready to remove patch, follow instructions on last 2 pages of Patient Log Book.  Stick patch monitor onto last page of Patient Log Book.   Place Patient Log Book in Blue box.  Use locking tab on box and tape box closed securely.  The Orange and White box has prepaid postage on it.  Please place in mailbox as soon as possible.  Your physician should have your test results approximately 7 days after the monitor has been mailed back to [employer].   Call [employer] Customer Care at [phone] if you have questions regarding your ZIO XT patch monitor.  Call them immediately if you see an orange light blinking on your monitor.   If your monitor falls off in less than 4 days contact our Monitor department at [phone].  If your monitor becomes loose or falls off after 4 days call [employer] at [phone] for suggestions on securing your monitor.     Follow-Up: At [facility], you and your health needs are our priority.    As part of our continuing mission to provide you with exceptional heart care, we have created designated Provider Care Teams.  These Care Teams include your primary Cardiologist (physician) and Advanced Practice Providers (APPs -  Physician Assistants and Nurse Practitioners) who all work together to provide you with the care you need, when you need it.  We recommend signing up for the patient portal called "MyChart".  Sign up information is provided on this After Visit Summary.  MyChart is used to connect with patients for Virtual Visits (Telemedicine).  Patients are able to view lab/test results, encounter notes, upcoming appointments, etc.   Non-urgent messages can be sent to your provider as well.   To learn more about what you can do with MyChart, go to [email or web address].    Your next appointment:   12 month(s)  The format for your next appointment:   In Person  Provider:   Jonathan Berry, MD 

## 2020-01-18 NOTE — Telephone Encounter (Signed)
Enrolled patient for a 14 day Zio XT Monitor to be mailed to patients home  

## 2020-01-20 DIAGNOSIS — M0589 Other rheumatoid arthritis with rheumatoid factor of multiple sites: Secondary | ICD-10-CM | POA: Diagnosis not present

## 2020-01-24 ENCOUNTER — Other Ambulatory Visit (INDEPENDENT_AMBULATORY_CARE_PROVIDER_SITE_OTHER): Payer: Medicare Other

## 2020-01-24 DIAGNOSIS — I480 Paroxysmal atrial fibrillation: Secondary | ICD-10-CM

## 2020-02-06 ENCOUNTER — Other Ambulatory Visit: Payer: Self-pay | Admitting: Cardiovascular Disease

## 2020-02-06 DIAGNOSIS — E782 Mixed hyperlipidemia: Secondary | ICD-10-CM

## 2020-02-16 DIAGNOSIS — I480 Paroxysmal atrial fibrillation: Secondary | ICD-10-CM | POA: Diagnosis not present

## 2020-02-22 ENCOUNTER — Telehealth: Payer: Self-pay | Admitting: Cardiovascular Disease

## 2020-02-22 NOTE — Telephone Encounter (Signed)
Spoke with pt, aware of dr berry's recommendations. 

## 2020-02-22 NOTE — Telephone Encounter (Signed)
Okay to discontinue Eliquis.

## 2020-02-22 NOTE — Telephone Encounter (Signed)
New Message:     Pt said she wore the Monitor for 14 days. She wants to know what to do about the Eliquis.Danielle Hebert

## 2020-02-22 NOTE — Telephone Encounter (Signed)
Lorretta Harp, MD  02/18/2020 7:18 AM EST   1: Sinus rhythm/sinus bradycardia/sinus tachycardia 2: Occasional PVCs with couplets and triplets 3: Short runs of SVT  Spoke with pt, aware will confirm with dr berry okay to stop eliquis because no atrial fib on monitor.

## 2020-03-06 ENCOUNTER — Telehealth: Payer: Self-pay | Admitting: Cardiovascular Disease

## 2020-03-06 NOTE — Telephone Encounter (Signed)
Pt updated and state she would prefer to schedule an appointment for further evaluations.  Appointment scheduled for first available on 03/20/20.

## 2020-03-06 NOTE — Telephone Encounter (Signed)
Spoke to pt who report two episode of feeling like her heart rate was rapid. Yesterday at 5 am (did not check BP/HR), then today around 3:30 am (HR 75, 70, 68, 66 BP 146/88, 136/84, 131/82, 129/80).   Currently pt state she doesn't feel symptoms of rapid heart beat but feels a little light headed, dizzy, and nauseated. HR 75,70,68,66 BP 129/80. Pt denies feeling like she is going to pass out but report she will try to eat something to see if it will help alleviate symptoms.    Pt also report she stopped Eliquis on 12/7 as recommended by MD. Pt is now questioning if she she restart medication.  Will route to MD for recommendations.

## 2020-03-06 NOTE — Telephone Encounter (Signed)
STAT if HR is under 50 or over 120 (normal HR is 60-100 beats per minute)  1) What is your heart rate? Pt has not taken her HR since 3:30 this morning  2) Do you have a log of your heart rate readings (document readings)? [phone] (all taken at 3:30 this morning)  3) Do you have any other symptoms? Nauseated, dizzy, hot Patient said this happens when she is laying in bed. She said once she gets up it starts racing again  Patient said she stopped taking the eliquis 12/07 after wearing a heart monitor for two weeks

## 2020-03-06 NOTE — Telephone Encounter (Signed)
.  No evidence of PAF on Zio patch Eliquis is discontinued.  If she continues to have palpitations please have her see an APP in the office for further evaluation.

## 2020-03-16 DIAGNOSIS — M0589 Other rheumatoid arthritis with rheumatoid factor of multiple sites: Secondary | ICD-10-CM | POA: Diagnosis not present

## 2020-03-20 ENCOUNTER — Ambulatory Visit (INDEPENDENT_AMBULATORY_CARE_PROVIDER_SITE_OTHER): Payer: Medicare Other | Admitting: Cardiology

## 2020-03-20 ENCOUNTER — Encounter: Payer: Self-pay | Admitting: Cardiology

## 2020-03-20 ENCOUNTER — Other Ambulatory Visit: Payer: Self-pay

## 2020-03-20 VITALS — BP 130/86 | HR 83 | Ht 65.0 in | Wt 143.4 lb

## 2020-03-20 DIAGNOSIS — I10 Essential (primary) hypertension: Secondary | ICD-10-CM | POA: Diagnosis not present

## 2020-03-20 DIAGNOSIS — R002 Palpitations: Secondary | ICD-10-CM | POA: Diagnosis not present

## 2020-03-20 DIAGNOSIS — E782 Mixed hyperlipidemia: Secondary | ICD-10-CM | POA: Diagnosis not present

## 2020-03-20 DIAGNOSIS — N1832 Chronic kidney disease, stage 3b: Secondary | ICD-10-CM

## 2020-03-20 DIAGNOSIS — I5189 Other ill-defined heart diseases: Secondary | ICD-10-CM

## 2020-03-20 DIAGNOSIS — I480 Paroxysmal atrial fibrillation: Secondary | ICD-10-CM | POA: Diagnosis not present

## 2020-03-20 MED ORDER — METOPROLOL SUCCINATE ER 25 MG PO TB24: 25.0000 mg | ORAL_TABLET | Freq: Every day | ORAL | 3 refills | Status: DC

## 2020-03-20 NOTE — Assessment & Plan Note (Signed)
Controlled- change Amlodipine 2.5 mg to Q AM

## 2020-03-20 NOTE — Progress Notes (Signed)
Cardiology Office Note:    Date:  03/20/2020   ID:  Danielle Hebert, DOB Sep 04, 1934, MRN [id number]  PCP:  Shirlean Mylar, MD  Cardiologist:  Nanetta Batty, MD  Electrophysiologist:  None   Referring MD: Shirlean Mylar, MD   CC:  Occasional palpitations  History of Present Illness:    Danielle Hebert is a pleasant 86 y.o. female who was admitted 12/30/17 after being found down at home. It was suspected she was down for 48 hours. On admission she had evidence of rhabdomyolysis with AKI and elevated CK. Cardiology was consulted for AF which was new for her. The patient was hydrated and anticoagulated. Echo showed her EF to be 55-60% with basilar hypertrophy, grade 2 DD, and normal LA size. Amiodarone was added. She converted before discharge.After this she had problems with Amiodarone,nausea and shaking, and eventually this was discontinued. She also had a rash with Eliquis and was changed to Xarelto. The rash actually got worse and she was eventually changed to Warfarin.  The patient no lives at [facility]. Dr Allyson Sabal saw her in Nov 2021.  An OP monitor was obtained and showed PVCs, and short runs of SVT.  It was decided to stop her anticoagulation.  She is in the office today with complaints of palpitations.  The patient says she woke with tachycardia a few days ago. It lasted less than 2 minutes.  She has not had recurrent palpitations.  She does admit to occasional caffeine use.  She has been taking her Toprol 12.5 mg Q HS as well as her Amlodipine 2.5 mg.   Past Medical History:  Diagnosis Date  . Hyperlipidemia   . Rheumatoid arthritis (HCC)   . Rheumatoid arthritis [facility])     Past Surgical History:  Procedure Laterality Date  . CHOLECYSTECTOMY      Current Medications: Current Meds  Medication Sig  . ALPRAZolam (XANAX) 0.5 MG tablet Take 0.5 mg by mouth at bedtime.   Marland Kitchen amLODipine (NORVASC) 2.5 MG tablet Take 1 tablet (2.5 mg total) by mouth daily.  .  cetirizine (ZYRTEC) 10 MG tablet Take 10 mg by mouth daily as needed for allergies.   . cholecalciferol (VITAMIN D) 1000 UNITS tablet Take 1,000 Units by mouth daily.  . cycloSPORINE (RESTASIS) 0.05 % ophthalmic emulsion Place 1 drop into both eyes 2 (two) times daily.  Marland Kitchen ezetimibe (ZETIA) 10 MG tablet Take 1 tablet by mouth once daily  . folic acid (FOLVITE) 1 MG tablet Take 1 mg by mouth daily.  . magnesium oxide (MAG-OX) 400 MG tablet Take 400 mg by mouth 2 (two) times daily.  . Melatonin-Pyridoxine (MELATIN PO) Take 1 tablet by mouth daily as needed.   . methotrexate 2.5 MG tablet Take 12.5 mg by mouth once a week.  . metroNIDAZOLE (METROCREAM) 0.75 % cream Apply 1 application topically 2 (two) times daily. Apply cream to affected area  . omeprazole (PRILOSEC) 40 MG capsule Take 40 mg by mouth daily.  . ondansetron (ZOFRAN) 4 MG tablet Take 4 mg by mouth every 8 (eight) hours as needed.  . [DISCONTINUED] metoprolol succinate (TOPROL-XL) 25 MG 24 hr tablet Take 1 tablet by mouth once daily     Allergies:   Eliquis [apixaban], Xarelto [rivaroxaban], Ciprofloxacin, Other, Remicade [infliximab], and Amiodarone   Social History   Socioeconomic History  . Marital status: Married    Spouse name: Not on file  . Number of children: Not on file  . Years of education: Not on file  .  Highest education level: Not on file  Occupational History  . Not on file  Tobacco Use  . Smoking status: Never Smoker  . Smokeless tobacco: Never Used  Substance and Sexual Activity  . Alcohol use: No  . Drug use: No  . Sexual activity: Not on file  Other Topics Concern  . Not on file  Social History Narrative  . Not on file   Social Determinants of Health   Financial Resource Strain: Not on file  Food Insecurity: Not on file  Transportation Needs: Not on file  Physical Activity: Not on file  Stress: Not on file  Social Connections: Not on file     Family History: The patient's family history  includes Cancer in her mother and sister.  ROS:   Please see the history of present illness.     All other systems reviewed and are negative.  EKGs/Labs/Other Studies Reviewed:    The following studies were reviewed today: Monitor 02/16/2020  EKG:  EKG is ordered today.  The ekg ordered today demonstrates NSR, HR 83, inferior Qs, lateral Qs  Recent Labs: No results found for requested labs within last 8760 hours.  Recent Lipid Panel    Component Value Date/Time   CHOL 216 (H) 01/20/2019 0841   TRIG 134 01/20/2019 0841   HDL 65 01/20/2019 0841   CHOLHDL 3.3 01/20/2019 0841   CHOLHDL 2.7 12/31/2017 0010   VLDL 14 12/31/2017 0010   LDLCALC 127 (H) 01/20/2019 0841    Physical Exam:    VS:  BP 130/86   Pulse 83   Ht 5\' 5"  (1.651 m)   Wt 143 lb 6.4 oz (65 kg)   SpO2 97%   BMI 23.86 kg/m     Wt Readings from Last 3 Encounters:  03/20/20 143 lb 6.4 oz (65 kg)  01/18/20 148 lb (67.1 kg)  02/10/19 137 lb 6.4 oz (62.3 kg)     GEN: Well nourished, well developed female in no acute distress HEENT: Normal NECK: No JVD; No carotid bruits CARDIAC: RRR, no murmurs, rubs, gallops RESPIRATORY:  Clear to auscultation without rales, wheezing or rhonchi  ABDOMEN: Soft,  non-distended MUSCULOSKELETAL:  No edema; No deformity  SKIN: Warm and dry NEUROLOGIC:  Alert and oriented x 3 PSYCHIATRIC:  Normal affect   ASSESSMENT:    Palpitations It does not sound like she has had sustained tachycardia.  Change Toprol to QAM and increase the dose to 25 mg daily. Avoid caffeine.   Paroxysmal atrial fibrillation ([place]) New onset PAF with RVR 12/30/17 in the setting of rhabdomyolysis.  She did not tolerate Amiodarone.  No recurrence on OP monitor Dec 2021 and [facility] stopped.   Essential hypertension Controlled- change Amlodipine 2.5 mg to Q AM  CKD (chronic kidney disease), stage III (HCC) Last GFR in 2020 was 42-  Her PCP follows  Diastolic dysfunction Grade 2 by echo with EF 55-60% and  basilar hypertrophy (LA WNL)  PLAN:    As above- increase Toprol to 25 mg daily- change to Q AM.  F/U Dr Gwenlyn Found in 6 months.   Medication Adjustments/Labs and Tests Ordered: Current medicines are reviewed at length with the patient today.  Concerns regarding medicines are outlined above.  Orders Placed This Encounter  Procedures  . EKG 12-Lead   Meds ordered this encounter  Medications  . metoprolol succinate (TOPROL-XL) 25 MG 24 hr tablet    Sig: Take 1 tablet (25 mg total) by mouth daily.    Dispense:  90  tablet    Refill:  3    Patient Instructions  Medication Instructions:  Start Metoprolol 25mg  ( 1 Tablet Daily) *If you need a refill on your cardiac medications before your next appointment, please call your pharmacy*   Lab Work: No labs   If you have labs (blood work) drawn today and your tests are completely normal, you will receive your results only by: Marland Kitchen MyChart Message (if you have MyChart) OR . A paper copy in the mail If you have any lab test that is abnormal or we need to change your treatment, we will call you to review the results.   Testing/Procedures: No Testing   Follow-Up: At [facility], you and your health needs are our priority.  As part of our continuing mission to provide you with exceptional heart care, we have created designated Provider Care Teams.  These Care Teams include your primary Cardiologist (physician) and Advanced Practice Providers (APPs -  Physician Assistants and Nurse Practitioners) who all work together to provide you with the care you need, when you need it.  We recommend signing up for the patient portal called "MyChart".  Sign up information is provided on this After Visit Summary.  MyChart is used to connect with patients for Virtual Visits (Telemedicine).  Patients are able to view lab/test results, encounter notes, upcoming appointments, etc.  Non-urgent messages can be sent to your provider as well.   To learn more about  what you can do with MyChart, go to [email or web address].    Your next appointment:   6 month(s)  The format for your next appointment:   In Person  Provider:   Quay Burow, MD   Other Instructions Avoid Caffeine     Signed, Kerin Ransom, PA-C  03/20/2020 11:07 AM    [place]

## 2020-03-20 NOTE — Assessment & Plan Note (Addendum)
It does not sound like she has had sustained tachycardia.  Change Toprol to QAM and increase the dose to 25 mg daily. Avoid caffeine.

## 2020-03-20 NOTE — Patient Instructions (Signed)
Medication Instructions:  Start Metoprolol 25mg  ( 1 Tablet Daily) *If you need a refill on your cardiac medications before your next appointment, please call your pharmacy*   Lab Work: No labs   If you have labs (blood work) drawn today and your tests are completely normal, you will receive your results only by: MyChart Message (if you have MyChart) OR . A paper copy in the mail If you have any lab test that is abnormal or we need to change your treatment, we will call you to review the results.   Testing/Procedures: No Testing   Follow-Up: At [facility], you and your health needs are our priority.  As part of our continuing mission to provide you with exceptional heart care, we have created designated Provider Care Teams.  These Care Teams include your primary Cardiologist (physician) and Advanced Practice Providers (APPs -  Physician Assistants and Nurse Practitioners) who all work together to provide you with the care you need, when you need it.  We recommend signing up for the patient portal called "MyChart".  Sign up information is provided on this After Visit Summary.  MyChart is used to connect with patients for Virtual Visits (Telemedicine).  Patients are able to view lab/test results, encounter notes, upcoming appointments, etc.  Non-urgent messages can be sent to your provider as well.   To learn more about what you can do with MyChart, go to [facility].    Your next appointment:   6 month(s)  The format for your next appointment:   In Person  Provider:   [email or web address], MD   Other Instructions Avoid Caffeine

## 2020-03-20 NOTE — Assessment & Plan Note (Signed)
New onset PAF with RVR 12/30/17 in the setting of rhabdomyolysis.  She did not tolerate Amiodarone.  No recurrence on OP monitor Dec 2021 and [facility] stopped.

## 2020-03-20 NOTE — Assessment & Plan Note (Signed)
Grade 2 by echo with EF 55-60% and basilar hypertrophy (LA WNL) 

## 2020-03-20 NOTE — Assessment & Plan Note (Signed)
Last GFR in 2020 was 42-  Her PCP follows

## 2020-04-04 DIAGNOSIS — Z23 Encounter for immunization: Secondary | ICD-10-CM | POA: Diagnosis not present

## 2020-05-11 DIAGNOSIS — R5383 Other fatigue: Secondary | ICD-10-CM | POA: Diagnosis not present

## 2020-05-11 DIAGNOSIS — M0589 Other rheumatoid arthritis with rheumatoid factor of multiple sites: Secondary | ICD-10-CM | POA: Diagnosis not present

## 2020-05-11 DIAGNOSIS — Z111 Encounter for screening for respiratory tuberculosis: Secondary | ICD-10-CM | POA: Diagnosis not present

## 2020-05-11 DIAGNOSIS — Z79899 Other long term (current) drug therapy: Secondary | ICD-10-CM | POA: Diagnosis not present

## 2020-05-22 ENCOUNTER — Telehealth: Payer: Self-pay | Admitting: Cardiovascular Disease

## 2020-05-22 MED ORDER — OMEPRAZOLE 40 MG PO CPDR: 40.0000 mg | DELAYED_RELEASE_CAPSULE | Freq: Every day | ORAL | 3 refills | Status: DC

## 2020-05-22 NOTE — Telephone Encounter (Signed)
*  STAT* If patient is at the pharmacy, call can be transferred to refill team.   1. Which medications need to be refilled? (please list name of each medication and dose if known) need a new prescription for Omeprazole  2. Which pharmacy/location (including street and city if local pharmacy) is medication to be sent to? [facility] [place], [place]  3. Do they need a 30 day or 90 day supply? 90 days and refills

## 2020-05-24 ENCOUNTER — Telehealth: Payer: Self-pay | Admitting: Cardiovascular Disease

## 2020-05-24 NOTE — Telephone Encounter (Signed)
Patient states her pharmacy has changed. She states it is now [facility] at the [address] of [address]

## 2020-05-24 NOTE — Telephone Encounter (Signed)
Pharmacy has been updated.

## 2020-05-26 DIAGNOSIS — F5101 Primary insomnia: Secondary | ICD-10-CM | POA: Diagnosis not present

## 2020-06-16 DIAGNOSIS — I10 Essential (primary) hypertension: Secondary | ICD-10-CM | POA: Diagnosis not present

## 2020-06-16 DIAGNOSIS — W19XXXA Unspecified fall, initial encounter: Secondary | ICD-10-CM | POA: Diagnosis not present

## 2020-06-16 DIAGNOSIS — R42 Dizziness and giddiness: Secondary | ICD-10-CM | POA: Diagnosis not present

## 2020-06-18 ENCOUNTER — Telehealth: Payer: Self-pay | Admitting: Physician Assistant

## 2020-06-18 NOTE — Telephone Encounter (Signed)
Patient is a 86 year old female with past medical history of atrial fibrillation and hypertension.  She also had history of rhabdomyolysis after a fall.  According to the patient, she suspected she had a TIA on Friday.  She says she was bringing in food from the cafeteria at [facility] back to her apartment when she suddenly became confused and unable to talk, and her blood pressure went up to the 170s.  Initial blood pressure was 170/100, repeat blood pressure was 169/91.  She informed the staff at [facility] independent [facility] who recommended bring her to the hospital, however by that time her symptom has completely resolved.  Therefore she did not seek medical attention that day.  Yesterday her blood pressure has came down to 143/78.  Looking back, patient was recently seen by Kerin Ransom, PA-C on 03/20/2020, blood pressure on that day was 130/86.  She called the cardiology answering service to figure out if she need need to take higher dose of blood pressure medication.  She has to 5 mg tablet of amlodipine and typically she only take half a tablet.  She also has metoprolol succinate as well.  The blood pressure spike would very well related to the neurological event she had.  Since her blood pressure has came down, I do not recommend permanently adjusting her blood pressure medication.  I recommend she take additional 2.5 mg of amlodipine as needed only if her systolic blood pressures greater than 150.  Since the symptom occurred 2 days ago and has completely resolved on the same day.  I will defer to Dr. Gwenlyn Found to decide if he would still recommend a head CT.

## 2020-06-19 ENCOUNTER — Telehealth: Payer: Self-pay | Admitting: Cardiovascular Disease

## 2020-06-19 NOTE — Telephone Encounter (Addendum)
Per phone note 4/3 advised patient to contact PCP as suggested by Dr Gwenlyn Found, verbalized understanding  Dr Gwenlyn Found is working at hospital today

## 2020-06-19 NOTE — Telephone Encounter (Signed)
New Message:    Pt said she had a TIA on Friday and would like to talk to the nurse. SHe says she feels alright now, just a little weak.

## 2020-06-19 NOTE — Telephone Encounter (Signed)
Should probably be seen by her PCP to decide

## 2020-06-22 NOTE — Telephone Encounter (Signed)
Discussed with Dr. Gwenlyn Found who recommended patient follow up with PCP in regard to possible TIA

## 2020-06-23 DIAGNOSIS — E538 Deficiency of other specified B group vitamins: Secondary | ICD-10-CM | POA: Diagnosis not present

## 2020-06-23 DIAGNOSIS — R5382 Chronic fatigue, unspecified: Secondary | ICD-10-CM | POA: Diagnosis not present

## 2020-06-23 DIAGNOSIS — R399 Unspecified symptoms and signs involving the genitourinary system: Secondary | ICD-10-CM | POA: Diagnosis not present

## 2020-06-23 DIAGNOSIS — I10 Essential (primary) hypertension: Secondary | ICD-10-CM | POA: Diagnosis not present

## 2020-06-23 DIAGNOSIS — G459 Transient cerebral ischemic attack, unspecified: Secondary | ICD-10-CM | POA: Diagnosis not present

## 2020-06-23 NOTE — Telephone Encounter (Signed)
Called patient to inform her of the recommendations of Dr. Gwenlyn Found about following up with her PCP. Patient stated that she is scheduled to see her PCP today and will have him fax over the office note.

## 2020-06-25 DIAGNOSIS — I6930 Unspecified sequelae of cerebral infarction: Secondary | ICD-10-CM | POA: Diagnosis not present

## 2020-06-26 ENCOUNTER — Other Ambulatory Visit (HOSPITAL_COMMUNITY): Payer: Self-pay | Admitting: Family Medicine

## 2020-06-26 DIAGNOSIS — G459 Transient cerebral ischemic attack, unspecified: Secondary | ICD-10-CM

## 2020-06-27 DIAGNOSIS — I6930 Unspecified sequelae of cerebral infarction: Secondary | ICD-10-CM | POA: Diagnosis not present

## 2020-06-28 ENCOUNTER — Other Ambulatory Visit (HOSPITAL_COMMUNITY): Payer: Self-pay | Admitting: Family Medicine

## 2020-06-28 DIAGNOSIS — H539 Unspecified visual disturbance: Secondary | ICD-10-CM

## 2020-06-28 DIAGNOSIS — G459 Transient cerebral ischemic attack, unspecified: Secondary | ICD-10-CM

## 2020-06-29 ENCOUNTER — Other Ambulatory Visit: Payer: Self-pay | Admitting: Family Medicine

## 2020-06-29 ENCOUNTER — Telehealth: Payer: Self-pay | Admitting: Cardiology

## 2020-06-29 DIAGNOSIS — G459 Transient cerebral ischemic attack, unspecified: Secondary | ICD-10-CM

## 2020-06-29 DIAGNOSIS — I6930 Unspecified sequelae of cerebral infarction: Secondary | ICD-10-CM | POA: Diagnosis not present

## 2020-06-29 MED ORDER — AMLODIPINE BESYLATE 2.5 MG PO TABS: 2.5000 mg | ORAL_TABLET | Freq: Every day | ORAL | 3 refills | Status: DC

## 2020-06-29 NOTE — Telephone Encounter (Signed)
*  STAT* If patient is at the pharmacy, call can be transferred to refill team.   1. Which medications need to be refilled? (please list name of each medication and dose if known) new prescription for Amlodipine 2.5 mg*  2. Which pharmacy/location (including street and city if local pharmacy) is medication to be sent to?[facility] [place] and [address]  3. Do they need a 30 day or 90 day supply? 90 days and refills

## 2020-07-04 ENCOUNTER — Encounter (HOSPITAL_COMMUNITY): Payer: Self-pay

## 2020-07-04 ENCOUNTER — Ambulatory Visit (HOSPITAL_COMMUNITY)
Admission: RE | Admit: 2020-07-04 | Discharge: 2020-07-04 | Disposition: A | Discharge status: Home / Self Care | Payer: Medicare Other | Source: Ambulatory Visit | Attending: Cardiology | Admitting: Cardiology

## 2020-07-04 ENCOUNTER — Other Ambulatory Visit: Payer: Self-pay

## 2020-07-04 DIAGNOSIS — G459 Transient cerebral ischemic attack, unspecified: Secondary | ICD-10-CM | POA: Diagnosis not present

## 2020-07-04 DIAGNOSIS — I6930 Unspecified sequelae of cerebral infarction: Secondary | ICD-10-CM | POA: Diagnosis not present

## 2020-07-04 NOTE — Progress Notes (Unsigned)
Spoke with Danielle Hebert at Dr. Oran Rein office in regards to an EKG that was ordered from her PCP to be performed today at the [facility] office while patient was here for her carotid ultrasound.  I spoke with our triage RN Hayley who indicated we don't do EKG's for PCP's in our office.  I informed the patient and then called and spoke to Danielle Hebert that an EKG will not be done today.   Salvadore Dom, RVT,RDCS,RDMS [facility]

## 2020-07-06 DIAGNOSIS — M0589 Other rheumatoid arthritis with rheumatoid factor of multiple sites: Secondary | ICD-10-CM | POA: Diagnosis not present

## 2020-07-06 DIAGNOSIS — I6930 Unspecified sequelae of cerebral infarction: Secondary | ICD-10-CM | POA: Diagnosis not present

## 2020-07-06 DIAGNOSIS — Z79899 Other long term (current) drug therapy: Secondary | ICD-10-CM | POA: Diagnosis not present

## 2020-07-11 ENCOUNTER — Ambulatory Visit
Admission: RE | Admit: 2020-07-11 | Discharge: 2020-07-11 | Disposition: A | Discharge status: Home / Self Care | Payer: Medicare Other | Source: Ambulatory Visit | Attending: Family Medicine | Admitting: Family Medicine

## 2020-07-11 DIAGNOSIS — H748X3 Other specified disorders of middle ear and mastoid, bilateral: Secondary | ICD-10-CM | POA: Diagnosis not present

## 2020-07-11 DIAGNOSIS — G459 Transient cerebral ischemic attack, unspecified: Secondary | ICD-10-CM

## 2020-07-11 DIAGNOSIS — G9389 Other specified disorders of brain: Secondary | ICD-10-CM | POA: Diagnosis not present

## 2020-07-11 DIAGNOSIS — I6930 Unspecified sequelae of cerebral infarction: Secondary | ICD-10-CM | POA: Diagnosis not present

## 2020-07-11 DIAGNOSIS — I6782 Cerebral ischemia: Secondary | ICD-10-CM | POA: Diagnosis not present

## 2020-07-11 DIAGNOSIS — G319 Degenerative disease of nervous system, unspecified: Secondary | ICD-10-CM | POA: Diagnosis not present

## 2020-07-11 IMAGING — MR MR HEAD W/O CM
10 series · 48 of 48 positions shown · non-contrast
Comparison: CT head [DATE].

CLINICAL DATA: Difficulty breathing remains dens is.

EXAM:
MRI HEAD WITHOUT CONTRAST
TECHNIQUE: Multiplanar, multiecho pulse sequences of the brain and surrounding
structures were obtained without intravenous contrast.

[Series 2: T1 · sagittal · 5.0mm · 0.45mm/px · 2 of 23 slices shown]
[im 1/23]
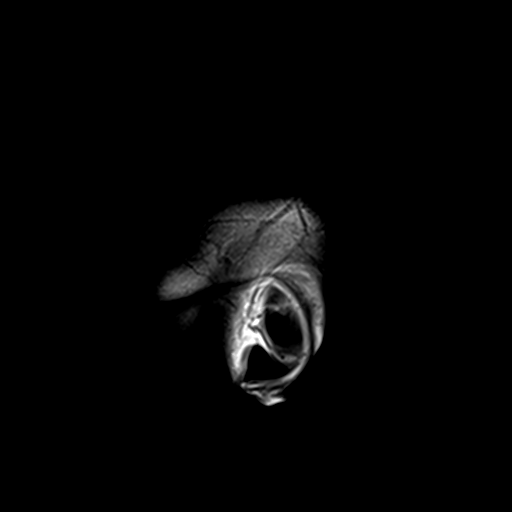
[im 23/23]
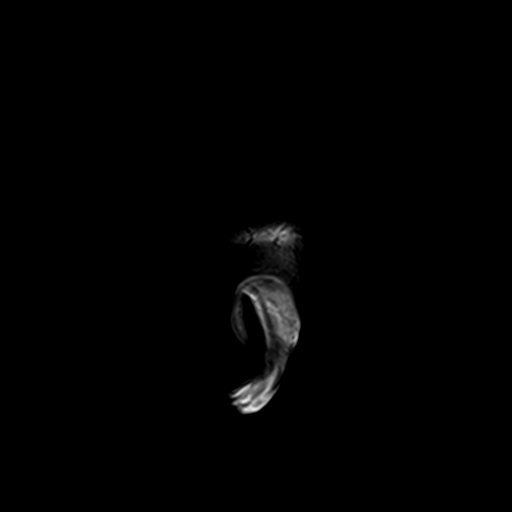

[Series 3: DWI · axial · 3.0mm · 1.80mm/px · z∈[-81,+64]mm · 9 of 97 slices shown (1 of 4)]
[im 1/97]
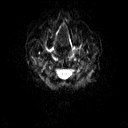
[im 13/97]
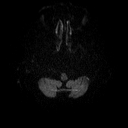
[im 25/97]
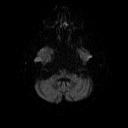
[im 37/97]
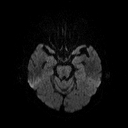
[im 49/97]
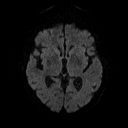
[im 61/97]
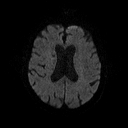
[im 73/97]
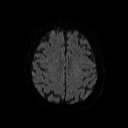
[im 85/97]
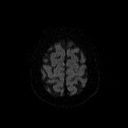
[im 97/97]
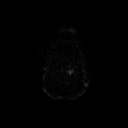

[Series 4: DWI · axial · 3.0mm · 1.80mm/px · z∈[-81,+64]mm · 5 of 50 slices shown (2 of 4)]
[im 1/50]
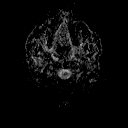
[im 13/50]
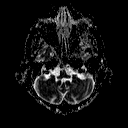
[im 25/50]
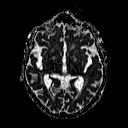
[im 37/50]
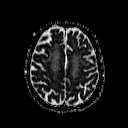
[im 50/50]
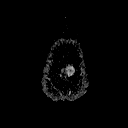

[Series 5: DWI · coronal · 5.0mm · 1.80mm/px · 6 of 68 slices shown (3 of 4)]
[im 1/68]
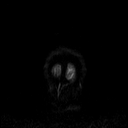
[im 14/68]
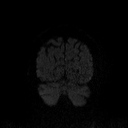
[im 27/68]
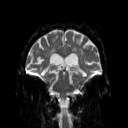
[im 41/68]
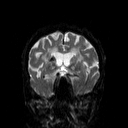
[im 54/68]
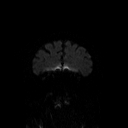
[im 68/68]
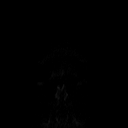

[Series 6: DWI · coronal · 5.0mm · 1.80mm/px · 3 of 34 slices shown (4 of 4)]
[im 1/34]
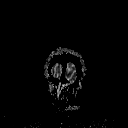
[im 17/34]
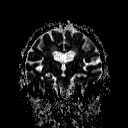
[im 34/34]
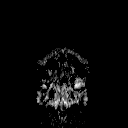

[Series 7: T2 · axial · 5.0mm · 0.51mm/px · z∈[-69,+76]mm · 2 of 22 slices shown (1 of 2)]
[im 1/22]
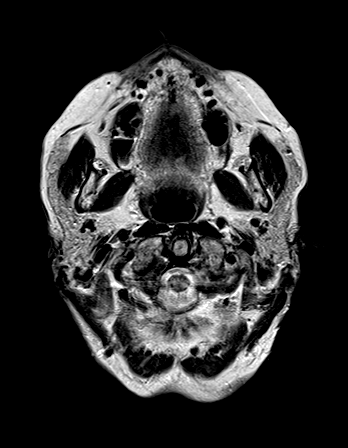
[im 22/22]
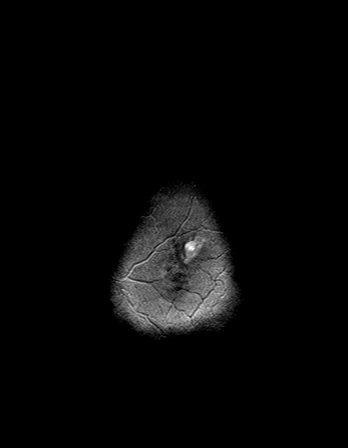

[Series 8: FLAIR · axial · 3.0mm · 0.45mm/px · z∈[-68,+74]mm · 3 of 32 slices shown]
[im 1/32]
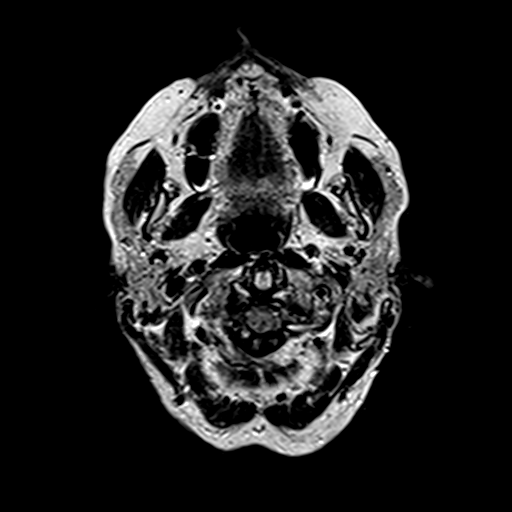
[im 16/32]
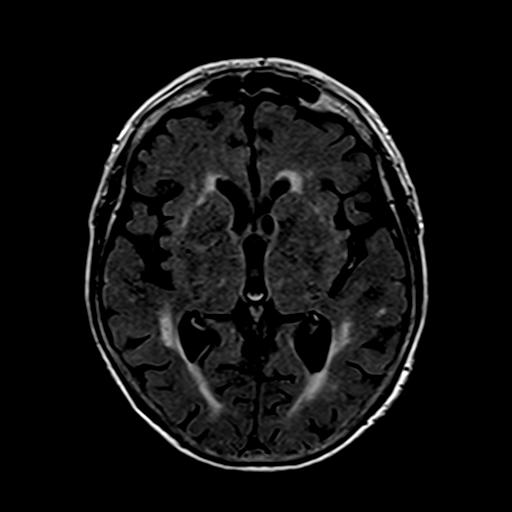
[im 32/32]
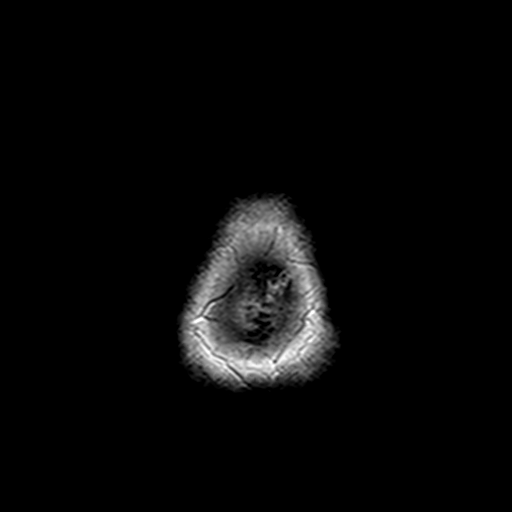

[Series 10: swi_images · axial · 4.0mm · 0.90mm/px · z∈[-66,+72]mm · 3 of 36 slices shown]
[im 1/36]
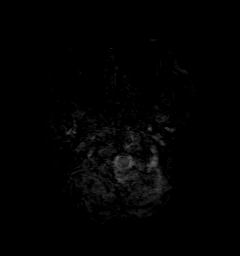
[im 18/36]
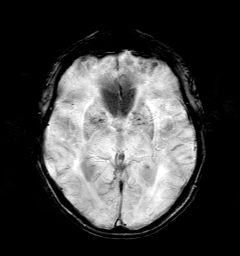
[im 36/36]
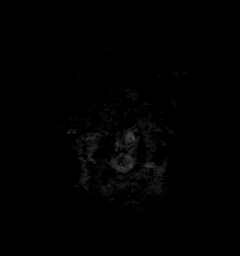

[Series 11: t1_mpr_tra · axial · 1.0mm · 0.71mm/px · z∈[-67,+73]mm · 13 of 144 slices shown]
[im 1/144]
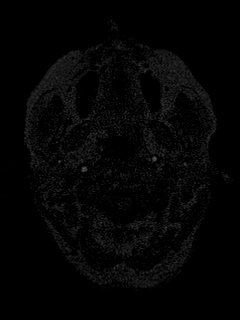
[im 12/144]
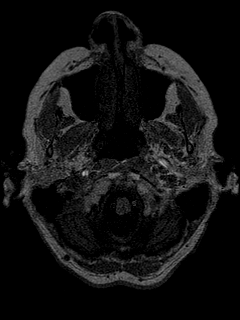
[im 24/144]
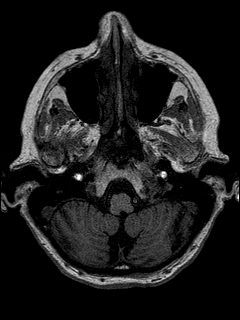
[im 36/144]
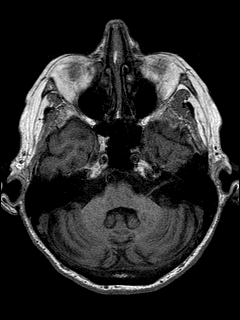
[im 48/144]
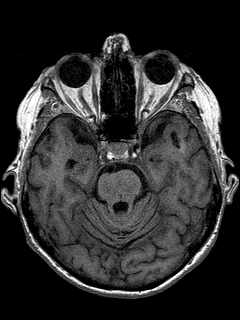
[im 60/144]
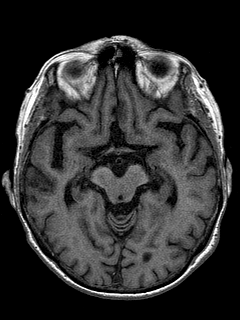
[im 72/144]
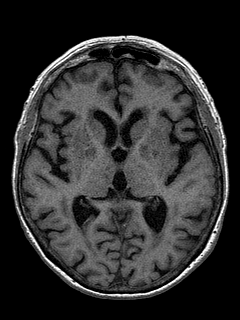
[im 84/144]
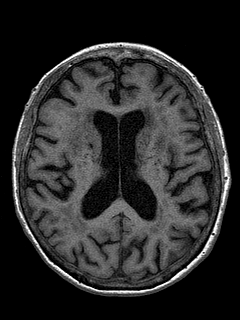
[im 96/144]
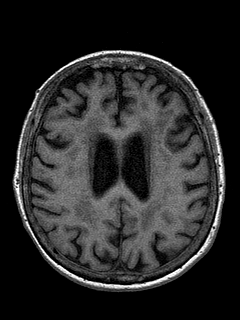
[im 108/144]
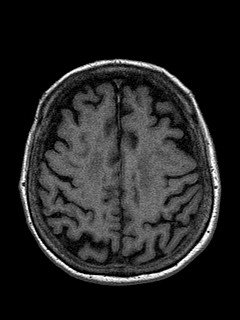
[im 120/144]
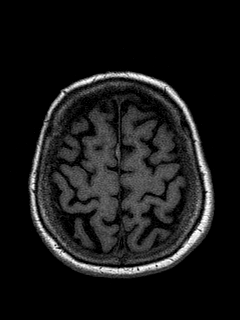
[im 132/144]
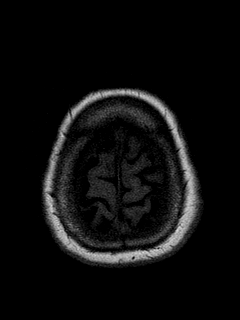
[im 144/144]
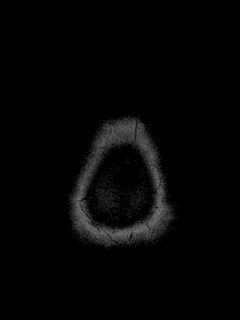

[Series 12: T2 · coronal · 5.0mm · 0.45mm/px · 2 of 25 slices shown (2 of 2)]
[im 1/25]
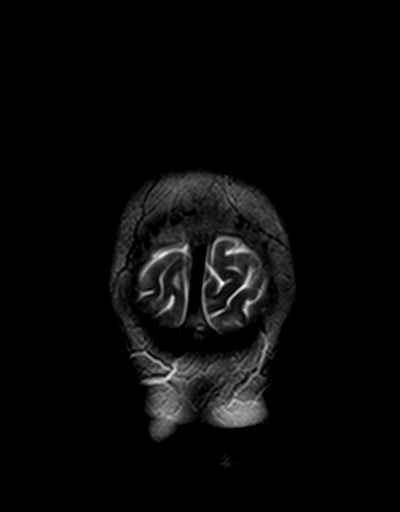
[im 25/25]
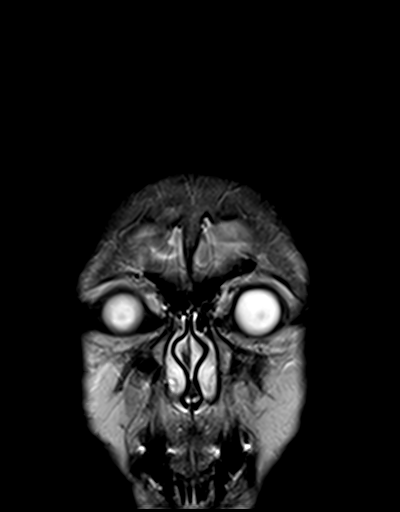

[48 of 48 positions shown; findings below may reference images not displayed]

FINDINGS: Brain: No acute infarction, acute hemorrhage, hydrocephalus,
extra-axial collection or mass lesion. Area of susceptibility
artifact in T2 hypointensity without surrounding edema in the right
basal ganglia, suggestive of prior hemorrhage. Moderate scattered
T2/FLAIR hyperintensities within white matter nonspecific but most
likely related to chronic microvascular ischemic disease.
Generalized atrophy with ex vacuo ventricular dilation.

Vascular: Major arterial flow voids are maintained skull base.

Skull and upper cervical spine: Normal marrow signal.

Sinuses/Orbits: Clear sinuses.  Unremarkable orbits.

Other: Small bilateral mastoid effusions.
IMPRESSION: 1. No acute abnormality.
2. Moderate chronic microvascular ischemic disease.
3. Evidence of prior hemorrhage in the right basal ganglia.

## 2020-07-13 DIAGNOSIS — I6930 Unspecified sequelae of cerebral infarction: Secondary | ICD-10-CM | POA: Diagnosis not present

## 2020-07-16 DIAGNOSIS — I6930 Unspecified sequelae of cerebral infarction: Secondary | ICD-10-CM | POA: Diagnosis not present

## 2020-07-18 DIAGNOSIS — I6930 Unspecified sequelae of cerebral infarction: Secondary | ICD-10-CM | POA: Diagnosis not present

## 2020-07-20 DIAGNOSIS — M25551 Pain in right hip: Secondary | ICD-10-CM | POA: Diagnosis not present

## 2020-07-20 DIAGNOSIS — M0589 Other rheumatoid arthritis with rheumatoid factor of multiple sites: Secondary | ICD-10-CM | POA: Diagnosis not present

## 2020-07-20 DIAGNOSIS — M8580 Other specified disorders of bone density and structure, unspecified site: Secondary | ICD-10-CM | POA: Diagnosis not present

## 2020-07-20 DIAGNOSIS — M150 Primary generalized (osteo)arthritis: Secondary | ICD-10-CM | POA: Diagnosis not present

## 2020-07-20 DIAGNOSIS — N1831 Chronic kidney disease, stage 3a: Secondary | ICD-10-CM | POA: Diagnosis not present

## 2020-07-20 DIAGNOSIS — Z6824 Body mass index (BMI) 24.0-24.9, adult: Secondary | ICD-10-CM | POA: Diagnosis not present

## 2020-07-26 DIAGNOSIS — I6930 Unspecified sequelae of cerebral infarction: Secondary | ICD-10-CM | POA: Diagnosis not present

## 2020-07-27 ENCOUNTER — Ambulatory Visit (HOSPITAL_COMMUNITY): Payer: Medicare Other | Attending: Cardiology

## 2020-07-27 ENCOUNTER — Other Ambulatory Visit: Payer: Self-pay

## 2020-07-27 DIAGNOSIS — G459 Transient cerebral ischemic attack, unspecified: Secondary | ICD-10-CM | POA: Diagnosis not present

## 2020-07-27 LAB — ECHOCARDIOGRAM COMPLETE
Area-P 1/2: 4.89 cm2
P 1/2 time: 466 msec
S' Lateral: 2.1 cm

## 2020-08-01 DIAGNOSIS — L905 Scar conditions and fibrosis of skin: Secondary | ICD-10-CM | POA: Diagnosis not present

## 2020-08-01 DIAGNOSIS — L821 Other seborrheic keratosis: Secondary | ICD-10-CM | POA: Diagnosis not present

## 2020-08-01 DIAGNOSIS — L723 Sebaceous cyst: Secondary | ICD-10-CM | POA: Diagnosis not present

## 2020-08-07 DIAGNOSIS — Z8673 Personal history of transient ischemic attack (TIA), and cerebral infarction without residual deficits: Secondary | ICD-10-CM | POA: Diagnosis not present

## 2020-08-10 DIAGNOSIS — I6930 Unspecified sequelae of cerebral infarction: Secondary | ICD-10-CM | POA: Diagnosis not present

## 2020-08-11 ENCOUNTER — Telehealth: Payer: Self-pay | Admitting: Cardiovascular Disease

## 2020-08-11 MED ORDER — AMLODIPINE BESYLATE 2.5 MG PO TABS: 2.5000 mg | ORAL_TABLET | Freq: Every day | ORAL | 3 refills | Status: DC

## 2020-08-11 NOTE — Telephone Encounter (Signed)
*  STAT* If patient is at the pharmacy, call can be transferred to refill team.   1. Which medications need to be refilled? (please list name of each medication and dose if known) Amlodipine 2.5  2. Which pharmacy/location (including street and city if local pharmacy) is medication to be sent to? [facility] [place]   3. Do they need a 30 day or 90 day supply? [place]

## 2020-08-11 NOTE — Telephone Encounter (Signed)
Medication refilled and sent to desired pharmacy ?

## 2020-08-15 DIAGNOSIS — H401134 Primary open-angle glaucoma, bilateral, indeterminate stage: Secondary | ICD-10-CM | POA: Diagnosis not present

## 2020-08-15 DIAGNOSIS — H40053 Ocular hypertension, bilateral: Secondary | ICD-10-CM | POA: Diagnosis not present

## 2020-08-15 DIAGNOSIS — H35033 Hypertensive retinopathy, bilateral: Secondary | ICD-10-CM | POA: Diagnosis not present

## 2020-08-15 DIAGNOSIS — H16223 Keratoconjunctivitis sicca, not specified as Sjogren's, bilateral: Secondary | ICD-10-CM | POA: Diagnosis not present

## 2020-08-15 DIAGNOSIS — I6930 Unspecified sequelae of cerebral infarction: Secondary | ICD-10-CM | POA: Diagnosis not present

## 2020-08-17 ENCOUNTER — Other Ambulatory Visit: Payer: Self-pay | Admitting: Cardiovascular Disease

## 2020-08-17 DIAGNOSIS — I6930 Unspecified sequelae of cerebral infarction: Secondary | ICD-10-CM | POA: Diagnosis not present

## 2020-08-17 DIAGNOSIS — E782 Mixed hyperlipidemia: Secondary | ICD-10-CM

## 2020-08-20 DIAGNOSIS — I6930 Unspecified sequelae of cerebral infarction: Secondary | ICD-10-CM | POA: Diagnosis not present

## 2020-08-22 DIAGNOSIS — I6930 Unspecified sequelae of cerebral infarction: Secondary | ICD-10-CM | POA: Diagnosis not present

## 2020-08-29 DIAGNOSIS — I6930 Unspecified sequelae of cerebral infarction: Secondary | ICD-10-CM | POA: Diagnosis not present

## 2020-08-31 DIAGNOSIS — Z79899 Other long term (current) drug therapy: Secondary | ICD-10-CM | POA: Diagnosis not present

## 2020-08-31 DIAGNOSIS — I6930 Unspecified sequelae of cerebral infarction: Secondary | ICD-10-CM | POA: Diagnosis not present

## 2020-08-31 DIAGNOSIS — M0589 Other rheumatoid arthritis with rheumatoid factor of multiple sites: Secondary | ICD-10-CM | POA: Diagnosis not present

## 2020-09-12 DIAGNOSIS — G459 Transient cerebral ischemic attack, unspecified: Secondary | ICD-10-CM | POA: Diagnosis not present

## 2020-09-13 ENCOUNTER — Telehealth: Payer: Self-pay | Admitting: Cardiovascular Disease

## 2020-09-13 NOTE — Telephone Encounter (Signed)
Called patient who states she was told by Dr. Jason Nest office that she has a heart valve that is causing increased pressure in her lungs. She had an echocardiogram on 5/12 and just got this call. I reviewed the echo report and advised that I will forward it to Dr. Gwenlyn Found for his review and comparison to her last echo in 2019. She asks if she will need surgery and I advised her that the results report mild to moderate valve regurgitation and do not warrant urgent action. She denies SOB and walks daily for 30+ minutes. I advised that I will forward to Dr. Gwenlyn Found for his review and that someone from our office will call her back in the next few days. I offered reassurance and she thanked me for the call.

## 2020-09-13 NOTE — Telephone Encounter (Signed)
Pt states that she just spoke with her pcp who is stating she just got her echocardio results and that the pt has a leaky valve in her heart that is putting pressure on her lungs... pt would like to see Dr. Gwenlyn Found regarding this.. please advise.

## 2020-09-14 DIAGNOSIS — G459 Transient cerebral ischemic attack, unspecified: Secondary | ICD-10-CM | POA: Diagnosis not present

## 2020-09-14 NOTE — Telephone Encounter (Signed)
Called patient and reviewed Dr. Kennon Holter comment that Danielle Hebert echo is unremarkable. I scheduled Danielle Hebert follow-up appointment for October with Dr Gwenlyn Found and the patient thanked me for my assistance.

## 2020-09-20 DIAGNOSIS — G459 Transient cerebral ischemic attack, unspecified: Secondary | ICD-10-CM | POA: Diagnosis not present

## 2020-09-22 DIAGNOSIS — G459 Transient cerebral ischemic attack, unspecified: Secondary | ICD-10-CM | POA: Diagnosis not present

## 2020-09-26 DIAGNOSIS — H40053 Ocular hypertension, bilateral: Secondary | ICD-10-CM | POA: Diagnosis not present

## 2020-09-26 DIAGNOSIS — H35033 Hypertensive retinopathy, bilateral: Secondary | ICD-10-CM | POA: Diagnosis not present

## 2020-09-26 DIAGNOSIS — H401134 Primary open-angle glaucoma, bilateral, indeterminate stage: Secondary | ICD-10-CM | POA: Diagnosis not present

## 2020-09-26 DIAGNOSIS — G459 Transient cerebral ischemic attack, unspecified: Secondary | ICD-10-CM | POA: Diagnosis not present

## 2020-09-28 DIAGNOSIS — G459 Transient cerebral ischemic attack, unspecified: Secondary | ICD-10-CM | POA: Diagnosis not present

## 2020-10-03 DIAGNOSIS — G459 Transient cerebral ischemic attack, unspecified: Secondary | ICD-10-CM | POA: Diagnosis not present

## 2020-10-05 DIAGNOSIS — E785 Hyperlipidemia, unspecified: Secondary | ICD-10-CM | POA: Diagnosis not present

## 2020-10-05 DIAGNOSIS — R7303 Prediabetes: Secondary | ICD-10-CM | POA: Diagnosis not present

## 2020-10-05 DIAGNOSIS — I4891 Unspecified atrial fibrillation: Secondary | ICD-10-CM | POA: Diagnosis not present

## 2020-10-05 DIAGNOSIS — K580 Irritable bowel syndrome with diarrhea: Secondary | ICD-10-CM | POA: Diagnosis not present

## 2020-10-05 DIAGNOSIS — N183 Chronic kidney disease, stage 3 unspecified: Secondary | ICD-10-CM | POA: Diagnosis not present

## 2020-10-05 DIAGNOSIS — Z0000 Encounter for general adult medical examination without abnormal findings: Secondary | ICD-10-CM | POA: Diagnosis not present

## 2020-10-05 DIAGNOSIS — M069 Rheumatoid arthritis, unspecified: Secondary | ICD-10-CM | POA: Diagnosis not present

## 2020-10-05 DIAGNOSIS — Z5181 Encounter for therapeutic drug level monitoring: Secondary | ICD-10-CM | POA: Diagnosis not present

## 2020-10-10 DIAGNOSIS — G459 Transient cerebral ischemic attack, unspecified: Secondary | ICD-10-CM | POA: Diagnosis not present

## 2020-10-12 DIAGNOSIS — G459 Transient cerebral ischemic attack, unspecified: Secondary | ICD-10-CM | POA: Diagnosis not present

## 2020-10-26 DIAGNOSIS — Z79899 Other long term (current) drug therapy: Secondary | ICD-10-CM | POA: Diagnosis not present

## 2020-10-26 DIAGNOSIS — M0589 Other rheumatoid arthritis with rheumatoid factor of multiple sites: Secondary | ICD-10-CM | POA: Diagnosis not present

## 2020-11-21 DIAGNOSIS — M06849 Other specified rheumatoid arthritis, unspecified hand: Secondary | ICD-10-CM | POA: Diagnosis not present

## 2020-11-21 DIAGNOSIS — M05741 Rheumatoid arthritis with rheumatoid factor of right hand without organ or systems involvement: Secondary | ICD-10-CM | POA: Diagnosis not present

## 2020-11-23 DIAGNOSIS — M06849 Other specified rheumatoid arthritis, unspecified hand: Secondary | ICD-10-CM | POA: Diagnosis not present

## 2020-11-23 DIAGNOSIS — M05741 Rheumatoid arthritis with rheumatoid factor of right hand without organ or systems involvement: Secondary | ICD-10-CM | POA: Diagnosis not present

## 2020-11-28 DIAGNOSIS — H5212 Myopia, left eye: Secondary | ICD-10-CM | POA: Diagnosis not present

## 2020-11-28 DIAGNOSIS — H401131 Primary open-angle glaucoma, bilateral, mild stage: Secondary | ICD-10-CM | POA: Diagnosis not present

## 2020-11-28 DIAGNOSIS — H16223 Keratoconjunctivitis sicca, not specified as Sjogren's, bilateral: Secondary | ICD-10-CM | POA: Diagnosis not present

## 2020-11-28 DIAGNOSIS — M06849 Other specified rheumatoid arthritis, unspecified hand: Secondary | ICD-10-CM | POA: Diagnosis not present

## 2020-11-28 DIAGNOSIS — M05741 Rheumatoid arthritis with rheumatoid factor of right hand without organ or systems involvement: Secondary | ICD-10-CM | POA: Diagnosis not present

## 2020-11-28 DIAGNOSIS — H40053 Ocular hypertension, bilateral: Secondary | ICD-10-CM | POA: Diagnosis not present

## 2020-11-28 DIAGNOSIS — H35033 Hypertensive retinopathy, bilateral: Secondary | ICD-10-CM | POA: Diagnosis not present

## 2020-11-28 DIAGNOSIS — Z961 Presence of intraocular lens: Secondary | ICD-10-CM | POA: Diagnosis not present

## 2020-11-29 DIAGNOSIS — H9193 Unspecified hearing loss, bilateral: Secondary | ICD-10-CM | POA: Diagnosis not present

## 2020-11-30 DIAGNOSIS — M06849 Other specified rheumatoid arthritis, unspecified hand: Secondary | ICD-10-CM | POA: Diagnosis not present

## 2020-11-30 DIAGNOSIS — M05741 Rheumatoid arthritis with rheumatoid factor of right hand without organ or systems involvement: Secondary | ICD-10-CM | POA: Diagnosis not present

## 2020-12-05 DIAGNOSIS — M05741 Rheumatoid arthritis with rheumatoid factor of right hand without organ or systems involvement: Secondary | ICD-10-CM | POA: Diagnosis not present

## 2020-12-05 DIAGNOSIS — M06849 Other specified rheumatoid arthritis, unspecified hand: Secondary | ICD-10-CM | POA: Diagnosis not present

## 2020-12-07 DIAGNOSIS — M06849 Other specified rheumatoid arthritis, unspecified hand: Secondary | ICD-10-CM | POA: Diagnosis not present

## 2020-12-07 DIAGNOSIS — M05741 Rheumatoid arthritis with rheumatoid factor of right hand without organ or systems involvement: Secondary | ICD-10-CM | POA: Diagnosis not present

## 2020-12-17 DIAGNOSIS — M06849 Other specified rheumatoid arthritis, unspecified hand: Secondary | ICD-10-CM | POA: Diagnosis not present

## 2020-12-17 DIAGNOSIS — M05741 Rheumatoid arthritis with rheumatoid factor of right hand without organ or systems involvement: Secondary | ICD-10-CM | POA: Diagnosis not present

## 2020-12-19 DIAGNOSIS — M05741 Rheumatoid arthritis with rheumatoid factor of right hand without organ or systems involvement: Secondary | ICD-10-CM | POA: Diagnosis not present

## 2020-12-19 DIAGNOSIS — M06849 Other specified rheumatoid arthritis, unspecified hand: Secondary | ICD-10-CM | POA: Diagnosis not present

## 2020-12-21 DIAGNOSIS — Z79899 Other long term (current) drug therapy: Secondary | ICD-10-CM | POA: Diagnosis not present

## 2020-12-21 DIAGNOSIS — M0589 Other rheumatoid arthritis with rheumatoid factor of multiple sites: Secondary | ICD-10-CM | POA: Diagnosis not present

## 2020-12-25 DIAGNOSIS — Z23 Encounter for immunization: Secondary | ICD-10-CM | POA: Diagnosis not present

## 2020-12-26 DIAGNOSIS — M06849 Other specified rheumatoid arthritis, unspecified hand: Secondary | ICD-10-CM | POA: Diagnosis not present

## 2020-12-26 DIAGNOSIS — M05741 Rheumatoid arthritis with rheumatoid factor of right hand without organ or systems involvement: Secondary | ICD-10-CM | POA: Diagnosis not present

## 2020-12-27 ENCOUNTER — Other Ambulatory Visit: Payer: Self-pay

## 2020-12-27 ENCOUNTER — Ambulatory Visit (INDEPENDENT_AMBULATORY_CARE_PROVIDER_SITE_OTHER): Payer: Medicare Other | Admitting: Cardiovascular Disease

## 2020-12-27 ENCOUNTER — Encounter: Payer: Self-pay | Admitting: Cardiovascular Disease

## 2020-12-27 VITALS — BP 140/78 | HR 76 | Ht 65.0 in | Wt 152.4 lb

## 2020-12-27 DIAGNOSIS — I071 Rheumatic tricuspid insufficiency: Secondary | ICD-10-CM | POA: Insufficient documentation

## 2020-12-27 DIAGNOSIS — I480 Paroxysmal atrial fibrillation: Secondary | ICD-10-CM

## 2020-12-27 DIAGNOSIS — E782 Mixed hyperlipidemia: Secondary | ICD-10-CM

## 2020-12-27 DIAGNOSIS — I5189 Other ill-defined heart diseases: Secondary | ICD-10-CM | POA: Diagnosis not present

## 2020-12-27 DIAGNOSIS — I10 Essential (primary) hypertension: Secondary | ICD-10-CM

## 2020-12-27 NOTE — Progress Notes (Signed)
12/27/2020 [name]   1935/03/14  [id number]  Primary Physician Glenis Smoker, MD Primary Cardiologist: Lorretta Harp MD Garret Redd[place], [place], [place]  HPI:  Danielle Hebert is a 86 y.o.  widowed Caucasian female mother of 2 living children (one son died of a myocardial infarction at age 47), grandmother to 4 grandchildren who I last saw in the office 01/18/2020.  I initially saw her during her index hospitalization 12/31/2017 when she came in with altered mental status after being found down for 2 days at home she did have rhabdomyolysis, elevated grade troponins which that I did not think were related to ACS and A. fib with RVR.  She had normal LV function.  She was ultimately placed on Eliquis and amiodarone converted to sinus rhythm.  A 2D echo was normal.  She was in sinus rhythm on 01/22/2018 when she saw Kerin Ransom in the office.  She has since discontinued amiodarone and anticoagulation.   Since I saw her a year ago she continues to do well.  She did have a 2D echocardiogram performed 07/27/2020 revealing normal LV systolic function, mild basal septal hypertrophy, mild to moderate TR with moderate pulmonary hypertension.  She is completely asymptomatic.  Current Meds  Medication Sig   ALPRAZolam (XANAX) 0.5 MG tablet Take 0.5 mg by mouth at bedtime.    amLODipine (NORVASC) 2.5 MG tablet Take 1 tablet (2.5 mg total) by mouth daily.   cholecalciferol (VITAMIN D) 1000 UNITS tablet Take 1,000 Units by mouth daily.   cycloSPORINE (RESTASIS) 0.05 % ophthalmic emulsion Place 1 drop into both eyes 2 (two) times daily.   ezetimibe (ZETIA) 10 MG tablet TAKE 1 TABLET BY MOUTH EVERY DAY   folic acid (FOLVITE) 1 MG tablet Take 1 mg by mouth daily.   Melatonin-Pyridoxine (MELATIN PO) Take 1 tablet by mouth daily as needed.    methotrexate 2.5 MG tablet Take 12.5 mg by mouth once a week.   metoprolol succinate (TOPROL-XL) 25 MG 24 hr tablet Take 1 tablet (25 mg total) by mouth  daily.   metroNIDAZOLE (METROCREAM) 0.75 % cream Apply 1 application topically 2 (two) times daily. Apply cream to affected area   omeprazole (PRILOSEC) 40 MG capsule Take 1 capsule (40 mg total) by mouth daily.     Allergies  Allergen Reactions   Eliquis [Apixaban] Rash   Xarelto [Rivaroxaban] Rash   Ciprofloxacin    Other     Other reaction(s): Leg cramps   Remicade [Infliximab]    Amiodarone Nausea Only    Social History   Socioeconomic History   Marital status: Married    Spouse name: Not on file   Number of children: Not on file   Years of education: Not on file   Highest education level: Not on file  Occupational History   Not on file  Tobacco Use   Smoking status: Never   Smokeless tobacco: Never  Substance and Sexual Activity   Alcohol use: No   Drug use: No   Sexual activity: Not on file  Other Topics Concern   Not on file  Social History Narrative   Not on file   Social Determinants of Health   Financial Resource Strain: Not on file  Food Insecurity: Not on file  Transportation Needs: Not on file  Physical Activity: Not on file  Stress: Not on file  Social Connections: Not on file  Intimate Partner Violence: Not on file     Review of Systems: General:  negative for chills, fever, night sweats or weight changes.  Cardiovascular: negative for chest pain, dyspnea on exertion, edema, orthopnea, palpitations, paroxysmal nocturnal dyspnea or shortness of breath Dermatological: negative for rash Respiratory: negative for cough or wheezing Urologic: negative for hematuria Abdominal: negative for nausea, vomiting, diarrhea, bright red blood per rectum, melena, or hematemesis Neurologic: negative for visual changes, syncope, or dizziness All other systems reviewed and are otherwise negative except as noted above.    Blood pressure 140/78, pulse 76, height 5\' 5"  (1.651 m), weight 152 lb 6.4 oz (69.1 kg), SpO2 98 %.  General appearance: alert and no  distress Neck: no adenopathy, no carotid bruit, no JVD, supple, symmetrical, trachea midline, and thyroid not enlarged, symmetric, no tenderness/mass/nodules Lungs: clear to auscultation bilaterally Heart: regular rate and rhythm, S1, S2 normal, no murmur, click, rub or gallop Extremities: extremities normal, atraumatic, no cyanosis or edema Pulses: 2+ and symmetric Skin: Skin color, texture, turgor normal. No rashes or lesions Neurologic: Grossly normal  EKG sinus rhythm at 76 with incomplete right bundle branch block and left axis deviation with borderline evidence of LVH.  I personally reviewed this EKG.  ASSESSMENT AND PLAN:   Hyperlipidemia History of hyperlipidemia on Zetia with lipid profile performed 10/05/2020 revealing total cholesterol of 180, LDL of 103 and HDL of 60.  Paroxysmal atrial fibrillation (HCC) History of PAF maintaining sinus rhythm not on oral anticoagulation.  Essential hypertension History of essential hypertension a blood pressure measured today at 140/78.  She is on amlodipine and metoprolol.  Moderate tricuspid regurgitation Recent 2D echocardiogram performed 07/27/2020 revealed normal LV systolic function with mild basal septal hypertrophy, mild to moderate TR with moderate pulmonary hypertension.  RV systolic pressure was measured at approxi-50 mmHg.  She is asymptomatic.     Lorretta Harp MD FACP,FACC,FAHA, [facility] 12/27/2020 1:52 PM

## 2020-12-27 NOTE — Assessment & Plan Note (Signed)
History of essential hypertension a blood pressure measured today at 140/78.  She is on amlodipine and metoprolol.

## 2020-12-27 NOTE — Assessment & Plan Note (Signed)
History of hyperlipidemia on Zetia with lipid profile performed 10/05/2020 revealing total cholesterol of 180, LDL of 103 and HDL of 60.

## 2020-12-27 NOTE — Patient Instructions (Signed)
Medication Instructions:  Your physician recommends that you continue on your current medications as directed. Please refer to the Current Medication list given to you today.  *If you need a refill on your cardiac medications before your next appointment, please call your pharmacy*   Testing/Procedures: Your physician has requested that you have an echocardiogram. Echocardiography is a painless test that uses sound waves to create images of your heart. It provides your doctor with information about the size and shape of your heart and how well your heart's chambers and valves are working. This procedure takes approximately one hour. There are no restrictions for this procedure. To be done in May 2023. This procedure is done at [address].    Follow-Up: At [facility], you and your health needs are our priority.  As part of our continuing mission to provide you with exceptional heart care, we have created designated Provider Care Teams.  These Care Teams include your primary Cardiologist (physician) and Advanced Practice Providers (APPs -  Physician Assistants and Nurse Practitioners) who all work together to provide you with the care you need, when you need it.  We recommend signing up for the patient portal called "MyChart".  Sign up information is provided on this After Visit Summary.  MyChart is used to connect with patients for Virtual Visits (Telemedicine).  Patients are able to view lab/test results, encounter notes, upcoming appointments, etc.  Non-urgent messages can be sent to your provider as well.   To learn more about what you can do with MyChart, go to [email or web address].    Your next appointment:   12 month(s)  The format for your next appointment:   In Person  Provider:   Quay Burow, MD

## 2020-12-27 NOTE — Assessment & Plan Note (Signed)
Recent 2D echocardiogram performed 07/27/2020 revealed normal LV systolic function with mild basal septal hypertrophy, mild to moderate TR with moderate pulmonary hypertension.  RV systolic pressure was measured at approxi-50 mmHg.  She is asymptomatic.

## 2020-12-27 NOTE — Assessment & Plan Note (Signed)
History of PAF maintaining sinus rhythm not on oral anticoagulation.

## 2020-12-28 ENCOUNTER — Telehealth: Payer: Self-pay | Admitting: Cardiovascular Disease

## 2020-12-28 DIAGNOSIS — M05741 Rheumatoid arthritis with rheumatoid factor of right hand without organ or systems involvement: Secondary | ICD-10-CM | POA: Diagnosis not present

## 2020-12-28 DIAGNOSIS — M06849 Other specified rheumatoid arthritis, unspecified hand: Secondary | ICD-10-CM | POA: Diagnosis not present

## 2020-12-28 NOTE — Telephone Encounter (Signed)
Called and spoke to patient. She states no one discuss any other diagnosis listed on her after summary visit. Dr Gwenlyn Found or nurse did not mention  to her about any testing. She  wanted to know how Dr Gwenlyn Found new all this information if she never had any testing done. Patient asked the RN to explain  the  5 diagnosis for her.  RN  explained to the patient she had an Echo 07/27/2020.   The diagnosis in mention were used to support - office visit and upcoming echo order.  Patient wanted to know lipid  levels-RN gave her last known result from 2020. Patient states she had some done recently .  She will be seeing her new primary Dr Aida Raider Dr Web is no longer there.   Patient verbalized understanding.  Patient stated will she need to do anything about setting up Echo, RN informed patient no the office will call her with appointment time closer to May 20223

## 2020-12-28 NOTE — Telephone Encounter (Signed)
Pt was seen by Dr. Gwenlyn Found on 12/27/20, pt would like a callback to explain her AVS paperwork to her. Pt states that none of the following were discussed at the Dr.'s appt and so she doesn't understand why they are listed on her after visit summary: Please advise pt further  Mixed hyperlipidemia Diastolic dysfunction Paroxysmal atrial fibrillation (HCC) Essential hypertension Moderate tricuspid regurgitation

## 2021-01-02 DIAGNOSIS — M05741 Rheumatoid arthritis with rheumatoid factor of right hand without organ or systems involvement: Secondary | ICD-10-CM | POA: Diagnosis not present

## 2021-01-02 DIAGNOSIS — M06849 Other specified rheumatoid arthritis, unspecified hand: Secondary | ICD-10-CM | POA: Diagnosis not present

## 2021-01-04 DIAGNOSIS — M05741 Rheumatoid arthritis with rheumatoid factor of right hand without organ or systems involvement: Secondary | ICD-10-CM | POA: Diagnosis not present

## 2021-01-04 DIAGNOSIS — M06849 Other specified rheumatoid arthritis, unspecified hand: Secondary | ICD-10-CM | POA: Diagnosis not present

## 2021-01-09 DIAGNOSIS — F321 Major depressive disorder, single episode, moderate: Secondary | ICD-10-CM | POA: Diagnosis not present

## 2021-01-09 DIAGNOSIS — F5101 Primary insomnia: Secondary | ICD-10-CM | POA: Diagnosis not present

## 2021-01-23 DIAGNOSIS — Z6825 Body mass index (BMI) 25.0-25.9, adult: Secondary | ICD-10-CM | POA: Diagnosis not present

## 2021-01-23 DIAGNOSIS — M05741 Rheumatoid arthritis with rheumatoid factor of right hand without organ or systems involvement: Secondary | ICD-10-CM | POA: Diagnosis not present

## 2021-01-23 DIAGNOSIS — E663 Overweight: Secondary | ICD-10-CM | POA: Diagnosis not present

## 2021-01-23 DIAGNOSIS — M8580 Other specified disorders of bone density and structure, unspecified site: Secondary | ICD-10-CM | POA: Diagnosis not present

## 2021-01-23 DIAGNOSIS — M0589 Other rheumatoid arthritis with rheumatoid factor of multiple sites: Secondary | ICD-10-CM | POA: Diagnosis not present

## 2021-01-23 DIAGNOSIS — N1831 Chronic kidney disease, stage 3a: Secondary | ICD-10-CM | POA: Diagnosis not present

## 2021-01-23 DIAGNOSIS — M150 Primary generalized (osteo)arthritis: Secondary | ICD-10-CM | POA: Diagnosis not present

## 2021-01-23 DIAGNOSIS — M06849 Other specified rheumatoid arthritis, unspecified hand: Secondary | ICD-10-CM | POA: Diagnosis not present

## 2021-01-23 DIAGNOSIS — M25551 Pain in right hip: Secondary | ICD-10-CM | POA: Diagnosis not present

## 2021-01-25 DIAGNOSIS — M05741 Rheumatoid arthritis with rheumatoid factor of right hand without organ or systems involvement: Secondary | ICD-10-CM | POA: Diagnosis not present

## 2021-01-25 DIAGNOSIS — M06849 Other specified rheumatoid arthritis, unspecified hand: Secondary | ICD-10-CM | POA: Diagnosis not present

## 2021-01-26 ENCOUNTER — Other Ambulatory Visit: Payer: Self-pay | Admitting: *Deleted

## 2021-01-26 DIAGNOSIS — E782 Mixed hyperlipidemia: Secondary | ICD-10-CM

## 2021-01-26 MED ORDER — EZETIMIBE 10 MG PO TABS: 10.0000 mg | ORAL_TABLET | Freq: Every day | ORAL | 11 refills | Status: AC

## 2021-01-30 DIAGNOSIS — M05741 Rheumatoid arthritis with rheumatoid factor of right hand without organ or systems involvement: Secondary | ICD-10-CM | POA: Diagnosis not present

## 2021-01-30 DIAGNOSIS — M06849 Other specified rheumatoid arthritis, unspecified hand: Secondary | ICD-10-CM | POA: Diagnosis not present

## 2021-02-01 DIAGNOSIS — G4700 Insomnia, unspecified: Secondary | ICD-10-CM | POA: Diagnosis not present

## 2021-02-01 DIAGNOSIS — R413 Other amnesia: Secondary | ICD-10-CM | POA: Diagnosis not present

## 2021-02-05 ENCOUNTER — Telehealth: Payer: Self-pay | Admitting: Cardiovascular Disease

## 2021-02-05 NOTE — Telephone Encounter (Signed)
Spoke with pt, aware we do not prescribe xanax or sleeping pills.

## 2021-02-05 NOTE — Telephone Encounter (Signed)
Patient states she needs something for anxiety. She says there are nights she does not sleep at all and is not sure if she needs a sleeping pill or something for anxiety. She says she was taking xanax, but her PCP has left and she is having difficulty getting the medication. She would like to know if a APP or another provider could prescribe something, since Dr. Gwenlyn Found will not be in the office until next week.

## 2021-02-15 DIAGNOSIS — M06849 Other specified rheumatoid arthritis, unspecified hand: Secondary | ICD-10-CM | POA: Diagnosis not present

## 2021-02-15 DIAGNOSIS — M05741 Rheumatoid arthritis with rheumatoid factor of right hand without organ or systems involvement: Secondary | ICD-10-CM | POA: Diagnosis not present

## 2021-02-15 DIAGNOSIS — Z79899 Other long term (current) drug therapy: Secondary | ICD-10-CM | POA: Diagnosis not present

## 2021-02-15 DIAGNOSIS — M0589 Other rheumatoid arthritis with rheumatoid factor of multiple sites: Secondary | ICD-10-CM | POA: Diagnosis not present

## 2021-02-20 DIAGNOSIS — R2689 Other abnormalities of gait and mobility: Secondary | ICD-10-CM | POA: Diagnosis not present

## 2021-02-20 DIAGNOSIS — M6281 Muscle weakness (generalized): Secondary | ICD-10-CM | POA: Diagnosis not present

## 2021-02-21 DIAGNOSIS — F5101 Primary insomnia: Secondary | ICD-10-CM | POA: Diagnosis not present

## 2021-03-14 ENCOUNTER — Other Ambulatory Visit: Payer: Self-pay

## 2021-03-14 MED ORDER — METOPROLOL SUCCINATE ER 25 MG PO TB24: 25.0000 mg | ORAL_TABLET | Freq: Every day | ORAL | 3 refills | Status: DC

## 2021-03-15 ENCOUNTER — Other Ambulatory Visit: Payer: Self-pay

## 2021-03-15 MED ORDER — METOPROLOL SUCCINATE ER 25 MG PO TB24: 25.0000 mg | ORAL_TABLET | Freq: Every day | ORAL | 3 refills | Status: AC

## 2021-04-03 DIAGNOSIS — H16223 Keratoconjunctivitis sicca, not specified as Sjogren's, bilateral: Secondary | ICD-10-CM | POA: Diagnosis not present

## 2021-04-03 DIAGNOSIS — H401131 Primary open-angle glaucoma, bilateral, mild stage: Secondary | ICD-10-CM | POA: Diagnosis not present

## 2021-04-03 DIAGNOSIS — H40053 Ocular hypertension, bilateral: Secondary | ICD-10-CM | POA: Diagnosis not present

## 2021-04-03 DIAGNOSIS — Z961 Presence of intraocular lens: Secondary | ICD-10-CM | POA: Diagnosis not present

## 2021-04-11 DIAGNOSIS — L570 Actinic keratosis: Secondary | ICD-10-CM | POA: Diagnosis not present

## 2021-04-11 DIAGNOSIS — D2272 Melanocytic nevi of left lower limb, including hip: Secondary | ICD-10-CM | POA: Diagnosis not present

## 2021-04-11 DIAGNOSIS — Z85828 Personal history of other malignant neoplasm of skin: Secondary | ICD-10-CM | POA: Diagnosis not present

## 2021-04-11 DIAGNOSIS — D225 Melanocytic nevi of trunk: Secondary | ICD-10-CM | POA: Diagnosis not present

## 2021-04-11 DIAGNOSIS — L989 Disorder of the skin and subcutaneous tissue, unspecified: Secondary | ICD-10-CM | POA: Diagnosis not present

## 2021-04-11 DIAGNOSIS — Z86018 Personal history of other benign neoplasm: Secondary | ICD-10-CM | POA: Diagnosis not present

## 2021-04-11 DIAGNOSIS — L821 Other seborrheic keratosis: Secondary | ICD-10-CM | POA: Diagnosis not present

## 2021-04-11 DIAGNOSIS — Z23 Encounter for immunization: Secondary | ICD-10-CM | POA: Diagnosis not present

## 2021-04-11 DIAGNOSIS — L814 Other melanin hyperpigmentation: Secondary | ICD-10-CM | POA: Diagnosis not present

## 2021-04-11 DIAGNOSIS — L84 Corns and callosities: Secondary | ICD-10-CM | POA: Diagnosis not present

## 2021-04-11 DIAGNOSIS — L738 Other specified follicular disorders: Secondary | ICD-10-CM | POA: Diagnosis not present

## 2021-04-12 DIAGNOSIS — Z79899 Other long term (current) drug therapy: Secondary | ICD-10-CM | POA: Diagnosis not present

## 2021-04-12 DIAGNOSIS — M0589 Other rheumatoid arthritis with rheumatoid factor of multiple sites: Secondary | ICD-10-CM | POA: Diagnosis not present

## 2021-05-18 ENCOUNTER — Telehealth: Payer: Self-pay | Admitting: Cardiovascular Disease

## 2021-05-18 MED ORDER — OMEPRAZOLE 40 MG PO CPDR: 40.0000 mg | DELAYED_RELEASE_CAPSULE | Freq: Every day | ORAL | 2 refills | Status: DC

## 2021-05-18 NOTE — Telephone Encounter (Signed)
Refills has been sent to the pharmacy. 

## 2021-05-18 NOTE — Telephone Encounter (Signed)
?*  STAT* If patient is at the pharmacy, call can be transferred to refill team. ? ? ?1. Which medications need to be refilled? (please list name of each medication and dose if known) Omeprazole ? ?2. Which pharmacy/location (including street and city if local pharmacy) is medication to be sent to?[facility]  [address] [place] ? ?3. Do they need a 30 day or 90 day supply? 90 days and refills ? ?

## 2021-06-04 DIAGNOSIS — I10 Essential (primary) hypertension: Secondary | ICD-10-CM | POA: Diagnosis not present

## 2021-06-04 DIAGNOSIS — R1084 Generalized abdominal pain: Secondary | ICD-10-CM | POA: Diagnosis not present

## 2021-06-07 DIAGNOSIS — Z111 Encounter for screening for respiratory tuberculosis: Secondary | ICD-10-CM | POA: Diagnosis not present

## 2021-06-07 DIAGNOSIS — R5383 Other fatigue: Secondary | ICD-10-CM | POA: Diagnosis not present

## 2021-06-07 DIAGNOSIS — Z79899 Other long term (current) drug therapy: Secondary | ICD-10-CM | POA: Diagnosis not present

## 2021-06-07 DIAGNOSIS — M0589 Other rheumatoid arthritis with rheumatoid factor of multiple sites: Secondary | ICD-10-CM | POA: Diagnosis not present

## 2021-06-11 DIAGNOSIS — K449 Diaphragmatic hernia without obstruction or gangrene: Secondary | ICD-10-CM | POA: Diagnosis not present

## 2021-07-04 DIAGNOSIS — Z79899 Other long term (current) drug therapy: Secondary | ICD-10-CM | POA: Diagnosis not present

## 2021-07-04 DIAGNOSIS — F5101 Primary insomnia: Secondary | ICD-10-CM | POA: Diagnosis not present

## 2021-07-04 DIAGNOSIS — F419 Anxiety disorder, unspecified: Secondary | ICD-10-CM | POA: Diagnosis not present

## 2021-07-04 DIAGNOSIS — K449 Diaphragmatic hernia without obstruction or gangrene: Secondary | ICD-10-CM | POA: Diagnosis not present

## 2021-07-23 DIAGNOSIS — Z6825 Body mass index (BMI) 25.0-25.9, adult: Secondary | ICD-10-CM | POA: Diagnosis not present

## 2021-07-23 DIAGNOSIS — M25551 Pain in right hip: Secondary | ICD-10-CM | POA: Diagnosis not present

## 2021-07-23 DIAGNOSIS — M1991 Primary osteoarthritis, unspecified site: Secondary | ICD-10-CM | POA: Diagnosis not present

## 2021-07-23 DIAGNOSIS — N1831 Chronic kidney disease, stage 3a: Secondary | ICD-10-CM | POA: Diagnosis not present

## 2021-07-23 DIAGNOSIS — E663 Overweight: Secondary | ICD-10-CM | POA: Diagnosis not present

## 2021-07-23 DIAGNOSIS — M8580 Other specified disorders of bone density and structure, unspecified site: Secondary | ICD-10-CM | POA: Diagnosis not present

## 2021-07-23 DIAGNOSIS — M0589 Other rheumatoid arthritis with rheumatoid factor of multiple sites: Secondary | ICD-10-CM | POA: Diagnosis not present

## 2021-08-02 DIAGNOSIS — M0589 Other rheumatoid arthritis with rheumatoid factor of multiple sites: Secondary | ICD-10-CM | POA: Diagnosis not present

## 2021-08-09 ENCOUNTER — Other Ambulatory Visit: Payer: Self-pay | Admitting: Gastroenterology

## 2021-08-09 DIAGNOSIS — K449 Diaphragmatic hernia without obstruction or gangrene: Secondary | ICD-10-CM

## 2021-08-09 DIAGNOSIS — R1013 Epigastric pain: Secondary | ICD-10-CM | POA: Diagnosis not present

## 2021-08-09 DIAGNOSIS — K219 Gastro-esophageal reflux disease without esophagitis: Secondary | ICD-10-CM | POA: Diagnosis not present

## 2021-08-14 ENCOUNTER — Ambulatory Visit (HOSPITAL_COMMUNITY): Payer: Medicare Other | Attending: Cardiovascular Disease

## 2021-08-14 DIAGNOSIS — I10 Essential (primary) hypertension: Secondary | ICD-10-CM | POA: Diagnosis not present

## 2021-08-14 DIAGNOSIS — E782 Mixed hyperlipidemia: Secondary | ICD-10-CM

## 2021-08-14 DIAGNOSIS — I071 Rheumatic tricuspid insufficiency: Secondary | ICD-10-CM | POA: Diagnosis not present

## 2021-08-14 LAB — ECHOCARDIOGRAM COMPLETE
Area-P 1/2: 3.66 cm2
S' Lateral: 2.6 cm

## 2021-08-21 ENCOUNTER — Ambulatory Visit
Admission: RE | Admit: 2021-08-21 | Discharge: 2021-08-21 | Disposition: A | Discharge status: Home / Self Care | Payer: Medicare Other | Source: Ambulatory Visit | Attending: Gastroenterology | Admitting: Gastroenterology

## 2021-08-21 ENCOUNTER — Other Ambulatory Visit: Payer: Self-pay | Admitting: Gastroenterology

## 2021-08-21 DIAGNOSIS — K449 Diaphragmatic hernia without obstruction or gangrene: Secondary | ICD-10-CM

## 2021-08-21 DIAGNOSIS — R1013 Epigastric pain: Secondary | ICD-10-CM | POA: Diagnosis not present

## 2021-08-21 DIAGNOSIS — K219 Gastro-esophageal reflux disease without esophagitis: Secondary | ICD-10-CM

## 2021-08-21 DIAGNOSIS — K224 Dyskinesia of esophagus: Secondary | ICD-10-CM | POA: Diagnosis not present

## 2021-08-21 IMAGING — RF DG UGI W SINGLE CM
11 of 17 series · 12 of 24 positions shown · non-contrast
Comparison: [DATE]
COMPARISON: [DATE]

Addendum:
CLINICAL DATA: Epigastric discomfort.  History of hiatal hernia.

EXAM:
UPPER GI SERIES WITH KUB
TECHNIQUE: After obtaining a scout radiograph a routine upper GI series was
performed using thin and high density barium.
FLUOROSCOPY:
Radiation Exposure Index (as provided by the fluoroscopic device):
70 mGy Kerma

[Series 2: sequence · 0.29mm/px · 1 of 19 frames shown (1 of 8)]
[frame 3/19]
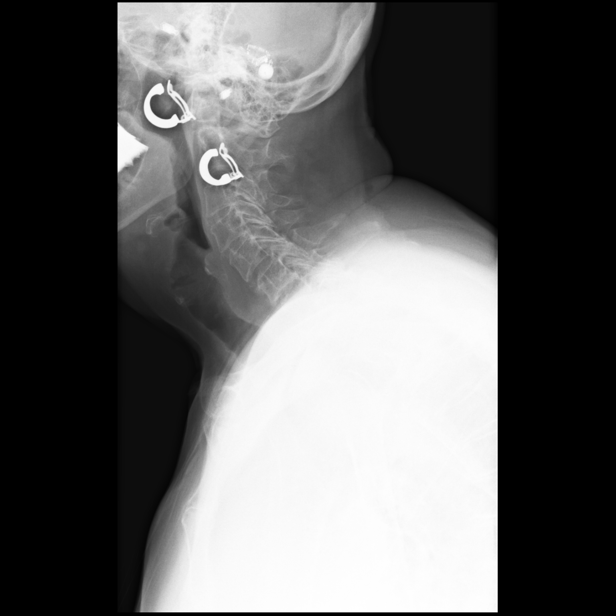

[Series 4: sequence · 0.29mm/px · 1 of 14 frames shown (2 of 8)]
[frame 8/14]
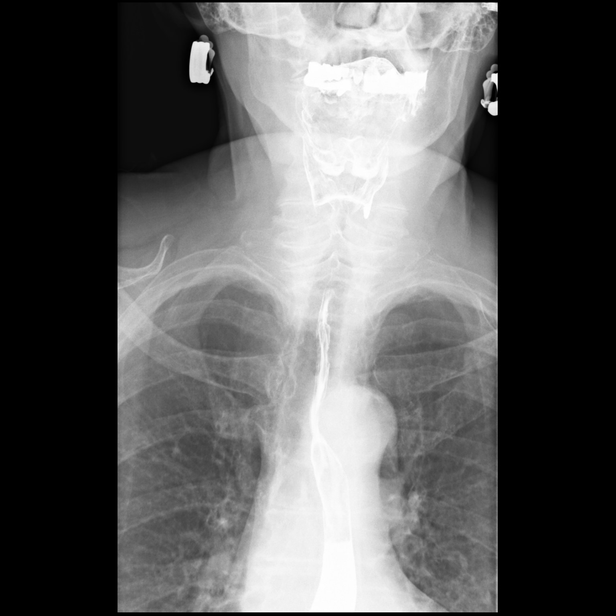

[Series 6: sequence · 0.29mm/px · 1 of 24 frames shown (3 of 8)]
[frame 13/24]
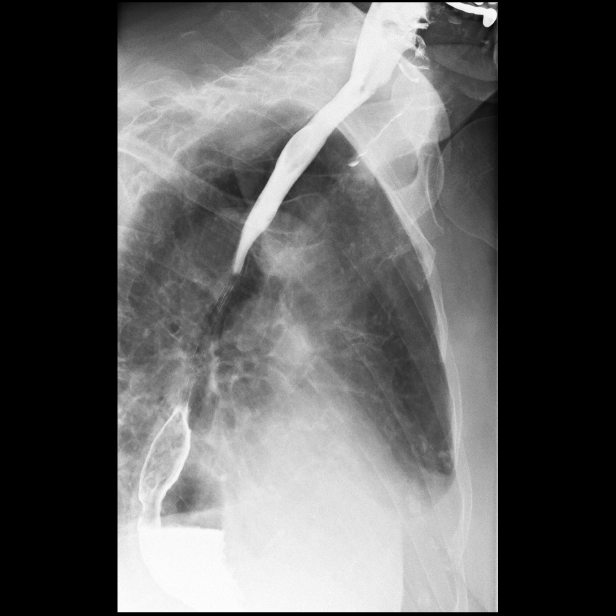

[Series 7: sequence · 0.29mm/px · 1 of 15 frames shown (4 of 8)]
[frame 8/15]
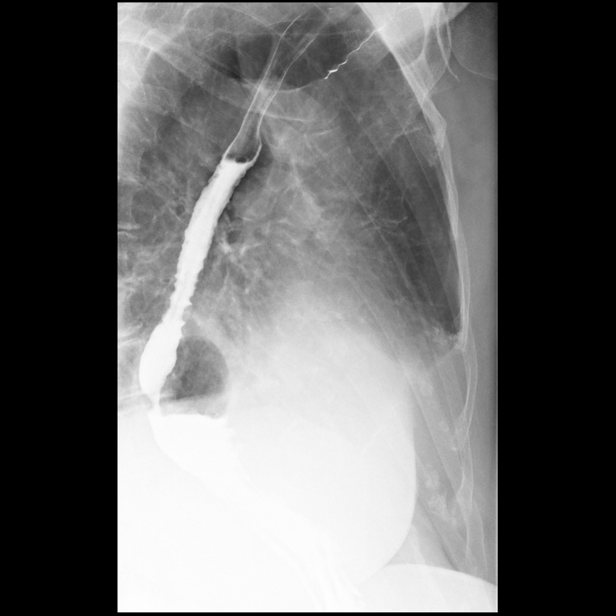

[Series 8: sequence · 0.29mm/px · 1 of 17 frames shown (5 of 8)]
[frame 15/17]
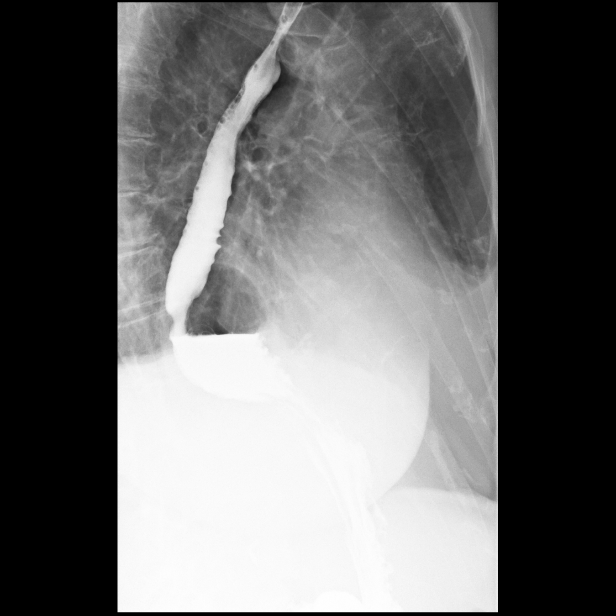

[Series 10: sequence · 0.29mm/px · 1 of 28 frames shown (6 of 8)]
[frame 15/28]
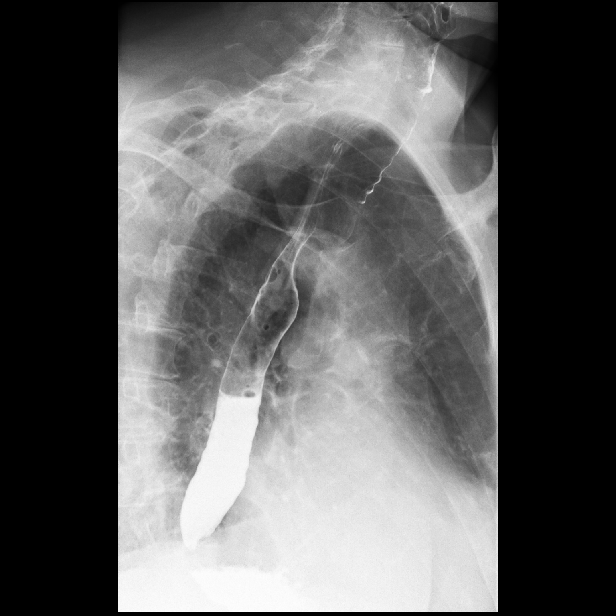

[Series 11: one shot · 0.15mm/px · 2 of 10 slices shown (1 of 3)]
[im 4/10]
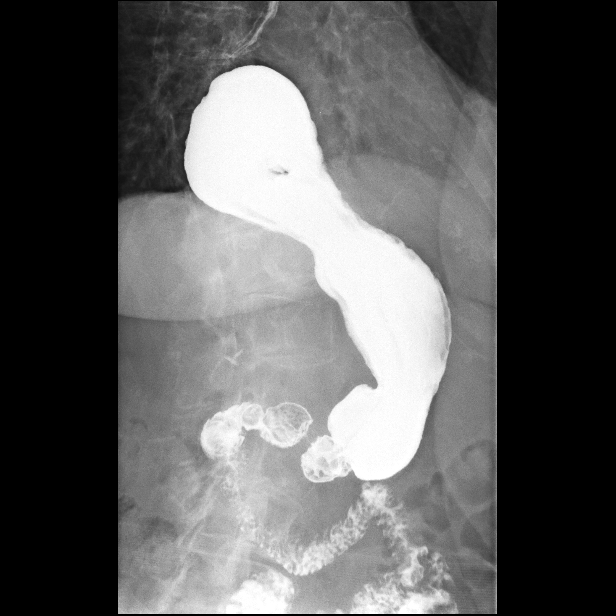
[im 10/10]
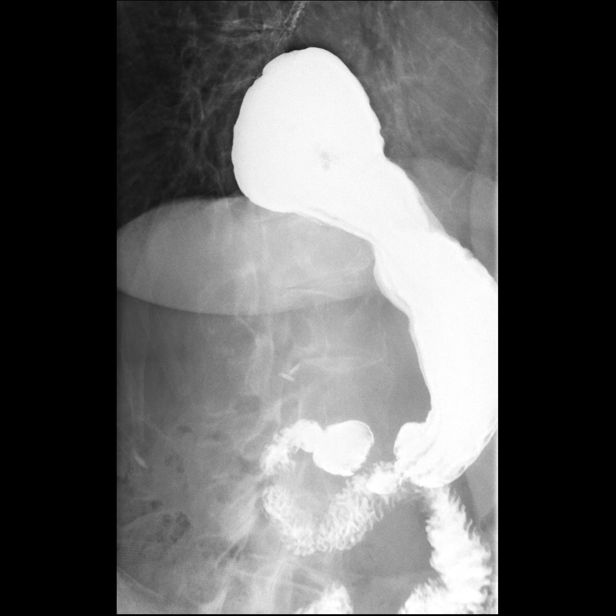

[Series 13: sequence · 1 of 26 frames shown (7 of 8)]
[frame 4/26]
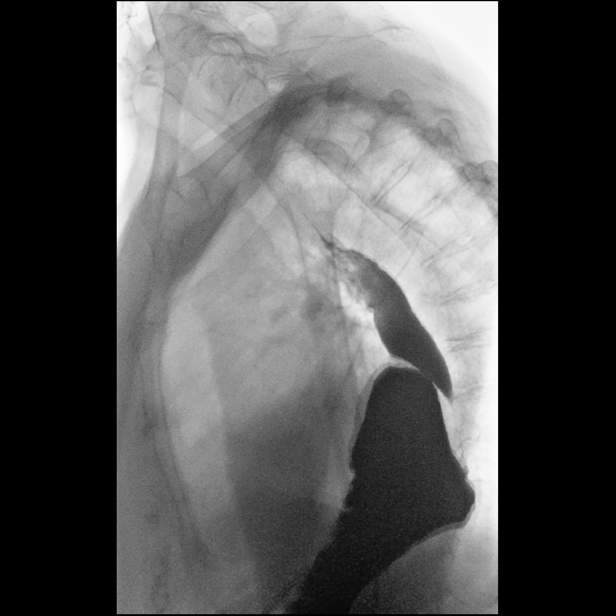

[Series 14: sequence · 1 of 47 frames shown (8 of 8)]
[frame 24/47]
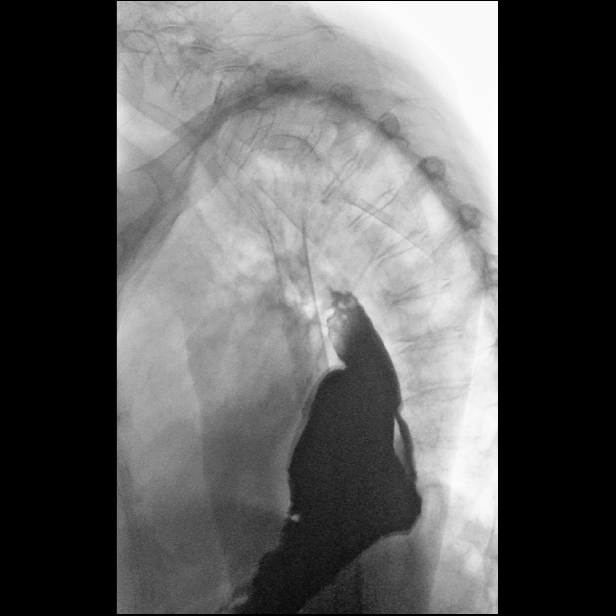

[Series 15: one shot · 1 of 3 slices shown (2 of 3)]
[im 3/3]
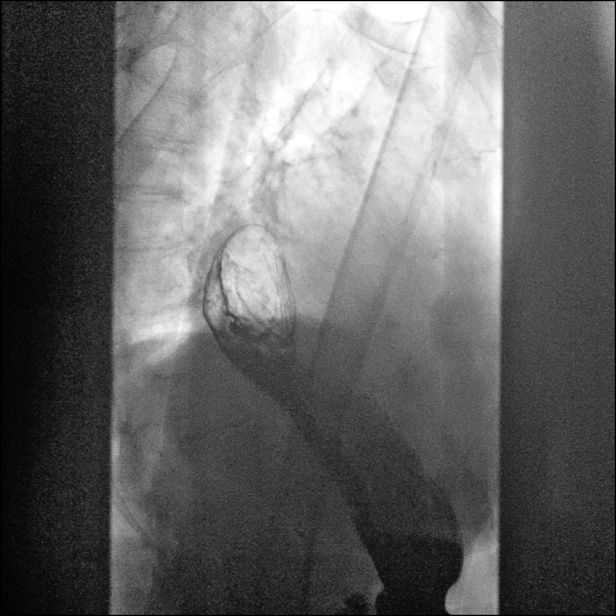

[Series 17: one shot · 1 of 1 slices shown (3 of 3)]
[im 1/1]
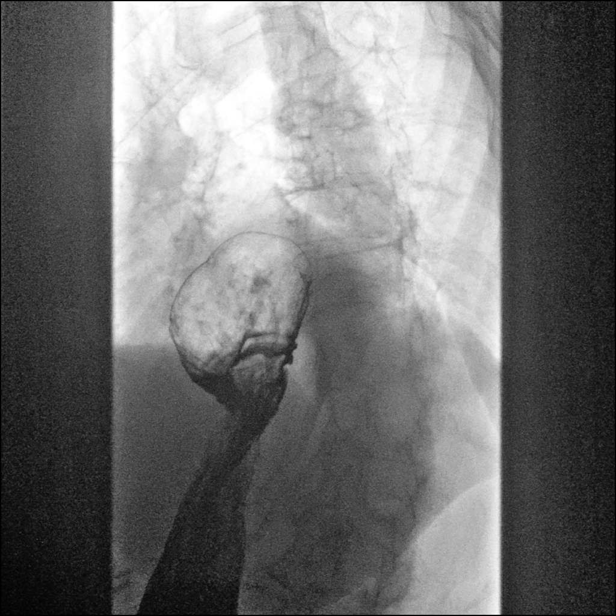

[12 of 24 positions shown; findings below may reference images not displayed]

FINDINGS: Pre-procedure scout film shows a normal bowel gas pattern. Surgical
clips in the right upper quadrant suggest prior cholecystectomy.

Frontal and lateral views of the hypopharynx while swallowing thick
barium are unremarkable. When the patient was turned LPO to assess
the esophagus, a trace amount of barium material is noted in the
trachea consistent with silent aspiration. Given the trace amount
visible in the trachea, an additional swallow of thin barium was
administered without visible aspiration. As such, the exam was
continued. There is no evidence for esophageal mass or stricture. No
gross mucosal ulceration. No esophageal diverticulum. Moderate
hiatal hernia again noted.

Assessment of esophageal motility reveals relatively good
preservation of primary peristalsis to the level of the distal third
of the esophagus. There are some mild tertiary contractions in the
mid and distal esophagus without presbyesophagus. 13 mm barium
tablet passes readily into the stomach when taken with water.

Multiplanar single contrast imaging in the is stomach again
demonstrates a moderate hiatal hernia with approximately 25-50% of
the stomach contained in the chest. No gross mass lesion noted in
the stomach. Gastric emptying is prompt. Pylorus and duodenal bulb
are normal. Duodenal C-loop and ligament of Treitz are normally
positioned.

13 mm barium tablet passes readily into the stomach when taken with
water.

No episodes of gastroesophageal reflux were observed.
IMPRESSION: 1. Similar appearance of moderate hiatal hernia. 25-50% of the
stomach is contained in the chest.
2. Mild nonspecific esophageal motility disorder.
3. 13 mm barium tablet passes readily into the stomach when taken
with water.

ADDENDUM:
As noted in the body of the report, a single episode of silent
aspiration was observed towards the beginning of the exam. Speech
pathology consultation and modified barium swallow may prove helpful
to further evaluate as clinically warranted.

*** End of Addendum ***
FINDINGS: Pre-procedure scout film shows a normal bowel gas pattern. Surgical
clips in the right upper quadrant suggest prior cholecystectomy.

Frontal and lateral views of the hypopharynx while swallowing thick
barium are unremarkable. When the patient was turned LPO to assess
the esophagus, a trace amount of barium material is noted in the
trachea consistent with silent aspiration. Given the trace amount
visible in the trachea, an additional swallow of thin barium was
administered without visible aspiration. As such, the exam was
continued. There is no evidence for esophageal mass or stricture. No
gross mucosal ulceration. No esophageal diverticulum. Moderate
hiatal hernia again noted.

Assessment of esophageal motility reveals relatively good
preservation of primary peristalsis to the level of the distal third
of the esophagus. There are some mild tertiary contractions in the
mid and distal esophagus without presbyesophagus. 13 mm barium
tablet passes readily into the stomach when taken with water.

Multiplanar single contrast imaging in the is stomach again
demonstrates a moderate hiatal hernia with approximately 25-50% of
the stomach contained in the chest. No gross mass lesion noted in
the stomach. Gastric emptying is prompt. Pylorus and duodenal bulb
are normal. Duodenal C-loop and ligament of Treitz are normally
positioned.

13 mm barium tablet passes readily into the stomach when taken with
water.

No episodes of gastroesophageal reflux were observed.
IMPRESSION: 1. Similar appearance of moderate hiatal hernia. 25-50% of the
stomach is contained in the chest.
2. Mild nonspecific esophageal motility disorder.
3. 13 mm barium tablet passes readily into the stomach when taken
with water.

## 2021-09-17 DIAGNOSIS — E785 Hyperlipidemia, unspecified: Secondary | ICD-10-CM | POA: Diagnosis not present

## 2021-09-17 DIAGNOSIS — I10 Essential (primary) hypertension: Secondary | ICD-10-CM | POA: Diagnosis not present

## 2021-09-17 DIAGNOSIS — N183 Chronic kidney disease, stage 3 unspecified: Secondary | ICD-10-CM | POA: Diagnosis not present

## 2021-09-17 DIAGNOSIS — G4700 Insomnia, unspecified: Secondary | ICD-10-CM | POA: Diagnosis not present

## 2021-09-17 DIAGNOSIS — M8580 Other specified disorders of bone density and structure, unspecified site: Secondary | ICD-10-CM | POA: Diagnosis not present

## 2021-09-17 DIAGNOSIS — M069 Rheumatoid arthritis, unspecified: Secondary | ICD-10-CM | POA: Diagnosis not present

## 2021-09-17 DIAGNOSIS — K219 Gastro-esophageal reflux disease without esophagitis: Secondary | ICD-10-CM | POA: Diagnosis not present

## 2021-09-17 DIAGNOSIS — F321 Major depressive disorder, single episode, moderate: Secondary | ICD-10-CM | POA: Diagnosis not present

## 2021-09-27 DIAGNOSIS — M0589 Other rheumatoid arthritis with rheumatoid factor of multiple sites: Secondary | ICD-10-CM | POA: Diagnosis not present

## 2021-10-31 ENCOUNTER — Other Ambulatory Visit: Payer: Self-pay | Admitting: Cardiovascular Disease

## 2021-11-07 DIAGNOSIS — Z0000 Encounter for general adult medical examination without abnormal findings: Secondary | ICD-10-CM | POA: Diagnosis not present

## 2021-11-07 DIAGNOSIS — N1831 Chronic kidney disease, stage 3a: Secondary | ICD-10-CM | POA: Diagnosis not present

## 2021-11-07 DIAGNOSIS — F5101 Primary insomnia: Secondary | ICD-10-CM | POA: Diagnosis not present

## 2021-11-07 DIAGNOSIS — E785 Hyperlipidemia, unspecified: Secondary | ICD-10-CM | POA: Diagnosis not present

## 2021-11-07 DIAGNOSIS — I10 Essential (primary) hypertension: Secondary | ICD-10-CM | POA: Diagnosis not present

## 2021-11-22 DIAGNOSIS — M0589 Other rheumatoid arthritis with rheumatoid factor of multiple sites: Secondary | ICD-10-CM | POA: Diagnosis not present

## 2021-12-24 DIAGNOSIS — Z23 Encounter for immunization: Secondary | ICD-10-CM | POA: Diagnosis not present

## 2022-01-17 DIAGNOSIS — M0589 Other rheumatoid arthritis with rheumatoid factor of multiple sites: Secondary | ICD-10-CM | POA: Diagnosis not present

## 2022-01-28 DIAGNOSIS — R1084 Generalized abdominal pain: Secondary | ICD-10-CM | POA: Diagnosis not present

## 2022-01-28 DIAGNOSIS — G4700 Insomnia, unspecified: Secondary | ICD-10-CM | POA: Diagnosis not present

## 2022-01-28 DIAGNOSIS — F419 Anxiety disorder, unspecified: Secondary | ICD-10-CM | POA: Diagnosis not present

## 2022-01-28 DIAGNOSIS — N183 Chronic kidney disease, stage 3 unspecified: Secondary | ICD-10-CM | POA: Diagnosis not present

## 2022-02-05 DIAGNOSIS — N1831 Chronic kidney disease, stage 3a: Secondary | ICD-10-CM | POA: Diagnosis not present

## 2022-02-05 DIAGNOSIS — E663 Overweight: Secondary | ICD-10-CM | POA: Diagnosis not present

## 2022-02-05 DIAGNOSIS — M25551 Pain in right hip: Secondary | ICD-10-CM | POA: Diagnosis not present

## 2022-02-05 DIAGNOSIS — M8580 Other specified disorders of bone density and structure, unspecified site: Secondary | ICD-10-CM | POA: Diagnosis not present

## 2022-02-05 DIAGNOSIS — Z6825 Body mass index (BMI) 25.0-25.9, adult: Secondary | ICD-10-CM | POA: Diagnosis not present

## 2022-02-05 DIAGNOSIS — M1991 Primary osteoarthritis, unspecified site: Secondary | ICD-10-CM | POA: Diagnosis not present

## 2022-02-05 DIAGNOSIS — M0589 Other rheumatoid arthritis with rheumatoid factor of multiple sites: Secondary | ICD-10-CM | POA: Diagnosis not present

## 2022-02-15 ENCOUNTER — Other Ambulatory Visit: Payer: Self-pay | Admitting: Cardiovascular Disease

## 2022-03-20 DIAGNOSIS — M0589 Other rheumatoid arthritis with rheumatoid factor of multiple sites: Secondary | ICD-10-CM | POA: Diagnosis not present

## 2022-04-12 DIAGNOSIS — K219 Gastro-esophageal reflux disease without esophagitis: Secondary | ICD-10-CM | POA: Diagnosis not present

## 2022-04-12 DIAGNOSIS — I10 Essential (primary) hypertension: Secondary | ICD-10-CM | POA: Diagnosis not present

## 2022-04-12 DIAGNOSIS — E785 Hyperlipidemia, unspecified: Secondary | ICD-10-CM | POA: Diagnosis not present

## 2022-04-12 DIAGNOSIS — M069 Rheumatoid arthritis, unspecified: Secondary | ICD-10-CM | POA: Diagnosis not present

## 2022-04-12 DIAGNOSIS — G4700 Insomnia, unspecified: Secondary | ICD-10-CM | POA: Diagnosis not present

## 2022-05-13 DIAGNOSIS — F5101 Primary insomnia: Secondary | ICD-10-CM | POA: Diagnosis not present

## 2022-05-13 DIAGNOSIS — N1831 Chronic kidney disease, stage 3a: Secondary | ICD-10-CM | POA: Diagnosis not present

## 2022-05-15 DIAGNOSIS — R5383 Other fatigue: Secondary | ICD-10-CM | POA: Diagnosis not present

## 2022-05-15 DIAGNOSIS — Z79899 Other long term (current) drug therapy: Secondary | ICD-10-CM | POA: Diagnosis not present

## 2022-05-15 DIAGNOSIS — M0589 Other rheumatoid arthritis with rheumatoid factor of multiple sites: Secondary | ICD-10-CM | POA: Diagnosis not present

## 2022-07-10 DIAGNOSIS — G4700 Insomnia, unspecified: Secondary | ICD-10-CM | POA: Diagnosis not present

## 2022-07-10 DIAGNOSIS — K219 Gastro-esophageal reflux disease without esophagitis: Secondary | ICD-10-CM | POA: Diagnosis not present

## 2022-07-10 DIAGNOSIS — M069 Rheumatoid arthritis, unspecified: Secondary | ICD-10-CM | POA: Diagnosis not present

## 2022-07-10 DIAGNOSIS — I10 Essential (primary) hypertension: Secondary | ICD-10-CM | POA: Diagnosis not present

## 2022-07-10 DIAGNOSIS — M8580 Other specified disorders of bone density and structure, unspecified site: Secondary | ICD-10-CM | POA: Diagnosis not present

## 2022-07-10 DIAGNOSIS — E785 Hyperlipidemia, unspecified: Secondary | ICD-10-CM | POA: Diagnosis not present

## 2022-07-10 DIAGNOSIS — N183 Chronic kidney disease, stage 3 unspecified: Secondary | ICD-10-CM | POA: Diagnosis not present

## 2022-07-11 DIAGNOSIS — M0589 Other rheumatoid arthritis with rheumatoid factor of multiple sites: Secondary | ICD-10-CM | POA: Diagnosis not present

## 2022-08-06 DIAGNOSIS — M1991 Primary osteoarthritis, unspecified site: Secondary | ICD-10-CM | POA: Diagnosis not present

## 2022-08-06 DIAGNOSIS — M25551 Pain in right hip: Secondary | ICD-10-CM | POA: Diagnosis not present

## 2022-08-06 DIAGNOSIS — M8580 Other specified disorders of bone density and structure, unspecified site: Secondary | ICD-10-CM | POA: Diagnosis not present

## 2022-08-06 DIAGNOSIS — E663 Overweight: Secondary | ICD-10-CM | POA: Diagnosis not present

## 2022-08-06 DIAGNOSIS — Z6825 Body mass index (BMI) 25.0-25.9, adult: Secondary | ICD-10-CM | POA: Diagnosis not present

## 2022-08-06 DIAGNOSIS — M0589 Other rheumatoid arthritis with rheumatoid factor of multiple sites: Secondary | ICD-10-CM | POA: Diagnosis not present

## 2022-08-06 DIAGNOSIS — N1831 Chronic kidney disease, stage 3a: Secondary | ICD-10-CM | POA: Diagnosis not present

## 2022-09-05 DIAGNOSIS — M0589 Other rheumatoid arthritis with rheumatoid factor of multiple sites: Secondary | ICD-10-CM | POA: Diagnosis not present

## 2022-10-31 DIAGNOSIS — Z79899 Other long term (current) drug therapy: Secondary | ICD-10-CM | POA: Diagnosis not present

## 2022-10-31 DIAGNOSIS — M0589 Other rheumatoid arthritis with rheumatoid factor of multiple sites: Secondary | ICD-10-CM | POA: Diagnosis not present

## 2022-11-11 DIAGNOSIS — L853 Xerosis cutis: Secondary | ICD-10-CM | POA: Diagnosis not present

## 2022-11-13 DIAGNOSIS — I700 Atherosclerosis of aorta: Secondary | ICD-10-CM | POA: Diagnosis not present

## 2022-11-13 DIAGNOSIS — F5101 Primary insomnia: Secondary | ICD-10-CM | POA: Diagnosis not present

## 2022-11-13 DIAGNOSIS — R7303 Prediabetes: Secondary | ICD-10-CM | POA: Diagnosis not present

## 2022-11-13 DIAGNOSIS — F419 Anxiety disorder, unspecified: Secondary | ICD-10-CM | POA: Diagnosis not present

## 2022-11-13 DIAGNOSIS — N1831 Chronic kidney disease, stage 3a: Secondary | ICD-10-CM | POA: Diagnosis not present

## 2022-11-13 DIAGNOSIS — Z0000 Encounter for general adult medical examination without abnormal findings: Secondary | ICD-10-CM | POA: Diagnosis not present

## 2022-11-13 DIAGNOSIS — D649 Anemia, unspecified: Secondary | ICD-10-CM | POA: Diagnosis not present

## 2022-11-13 DIAGNOSIS — I10 Essential (primary) hypertension: Secondary | ICD-10-CM | POA: Diagnosis not present

## 2022-11-13 DIAGNOSIS — M069 Rheumatoid arthritis, unspecified: Secondary | ICD-10-CM | POA: Diagnosis not present

## 2022-11-13 DIAGNOSIS — E785 Hyperlipidemia, unspecified: Secondary | ICD-10-CM | POA: Diagnosis not present

## 2022-12-26 DIAGNOSIS — Z79899 Other long term (current) drug therapy: Secondary | ICD-10-CM | POA: Diagnosis not present

## 2022-12-26 DIAGNOSIS — M0589 Other rheumatoid arthritis with rheumatoid factor of multiple sites: Secondary | ICD-10-CM | POA: Diagnosis not present

## 2023-01-15 DIAGNOSIS — Z85828 Personal history of other malignant neoplasm of skin: Secondary | ICD-10-CM | POA: Diagnosis not present

## 2023-01-15 DIAGNOSIS — L821 Other seborrheic keratosis: Secondary | ICD-10-CM | POA: Diagnosis not present

## 2023-01-15 DIAGNOSIS — L814 Other melanin hyperpigmentation: Secondary | ICD-10-CM | POA: Diagnosis not present

## 2023-01-15 DIAGNOSIS — D225 Melanocytic nevi of trunk: Secondary | ICD-10-CM | POA: Diagnosis not present

## 2023-01-15 DIAGNOSIS — D2272 Melanocytic nevi of left lower limb, including hip: Secondary | ICD-10-CM | POA: Diagnosis not present

## 2023-01-15 DIAGNOSIS — Z86018 Personal history of other benign neoplasm: Secondary | ICD-10-CM | POA: Diagnosis not present

## 2023-02-04 DIAGNOSIS — Z6826 Body mass index (BMI) 26.0-26.9, adult: Secondary | ICD-10-CM | POA: Diagnosis not present

## 2023-02-04 DIAGNOSIS — N1831 Chronic kidney disease, stage 3a: Secondary | ICD-10-CM | POA: Diagnosis not present

## 2023-02-04 DIAGNOSIS — M25551 Pain in right hip: Secondary | ICD-10-CM | POA: Diagnosis not present

## 2023-02-04 DIAGNOSIS — E663 Overweight: Secondary | ICD-10-CM | POA: Diagnosis not present

## 2023-02-04 DIAGNOSIS — M0589 Other rheumatoid arthritis with rheumatoid factor of multiple sites: Secondary | ICD-10-CM | POA: Diagnosis not present

## 2023-02-04 DIAGNOSIS — M8580 Other specified disorders of bone density and structure, unspecified site: Secondary | ICD-10-CM | POA: Diagnosis not present

## 2023-02-04 DIAGNOSIS — M1991 Primary osteoarthritis, unspecified site: Secondary | ICD-10-CM | POA: Diagnosis not present

## 2023-02-20 DIAGNOSIS — Z79899 Other long term (current) drug therapy: Secondary | ICD-10-CM | POA: Diagnosis not present

## 2023-02-20 DIAGNOSIS — M0589 Other rheumatoid arthritis with rheumatoid factor of multiple sites: Secondary | ICD-10-CM | POA: Diagnosis not present

## 2023-03-27 ENCOUNTER — Telehealth: Payer: Self-pay | Admitting: Cardiovascular Disease

## 2023-03-27 NOTE — Telephone Encounter (Signed)
 Pt is requesting a refill on medication omeprazole. Pt has not been seen since 12/27/2020. Would Dr. Allyson Sabal like to refill this medication? Please address

## 2023-03-27 NOTE — Telephone Encounter (Signed)
*  STAT* If patient is at the pharmacy, call can be transferred to refill team.   1. Which medications need to be refilled? (please list name of each medication and dose if known) omeprazole  (PRILOSEC) 40 MG capsule   2. Which pharmacy/location (including street and city if local pharmacy) is medication to be sent to? [id number] - [place], [place] - [address] AT [facility] [address]   3. Do they need a 30 day or 90 day supply? 9

## 2023-03-31 ENCOUNTER — Other Ambulatory Visit: Payer: Self-pay

## 2023-03-31 MED ORDER — OMEPRAZOLE 40 MG PO CPDR: 40.0000 mg | DELAYED_RELEASE_CAPSULE | Freq: Every day | ORAL | 3 refills | Status: AC

## 2023-04-02 NOTE — Telephone Encounter (Signed)
 Avanell Leigh, MD  [facility] Triage    Fine with me   Prescription has been sent to pt's preferred pharmacy.

## 2023-04-11 DIAGNOSIS — H40053 Ocular hypertension, bilateral: Secondary | ICD-10-CM | POA: Diagnosis not present

## 2023-04-11 DIAGNOSIS — H16223 Keratoconjunctivitis sicca, not specified as Sjogren's, bilateral: Secondary | ICD-10-CM | POA: Diagnosis not present

## 2023-04-11 DIAGNOSIS — H35033 Hypertensive retinopathy, bilateral: Secondary | ICD-10-CM | POA: Diagnosis not present

## 2023-04-11 DIAGNOSIS — H5212 Myopia, left eye: Secondary | ICD-10-CM | POA: Diagnosis not present

## 2023-04-11 DIAGNOSIS — H524 Presbyopia: Secondary | ICD-10-CM | POA: Diagnosis not present

## 2023-04-11 DIAGNOSIS — H401131 Primary open-angle glaucoma, bilateral, mild stage: Secondary | ICD-10-CM | POA: Diagnosis not present

## 2023-04-11 DIAGNOSIS — H52222 Regular astigmatism, left eye: Secondary | ICD-10-CM | POA: Diagnosis not present

## 2023-04-17 DIAGNOSIS — M0589 Other rheumatoid arthritis with rheumatoid factor of multiple sites: Secondary | ICD-10-CM | POA: Diagnosis not present

## 2023-06-02 DIAGNOSIS — N1831 Chronic kidney disease, stage 3a: Secondary | ICD-10-CM | POA: Diagnosis not present

## 2023-06-02 DIAGNOSIS — F5101 Primary insomnia: Secondary | ICD-10-CM | POA: Diagnosis not present

## 2023-06-02 DIAGNOSIS — F321 Major depressive disorder, single episode, moderate: Secondary | ICD-10-CM | POA: Diagnosis not present

## 2023-06-02 DIAGNOSIS — I10 Essential (primary) hypertension: Secondary | ICD-10-CM | POA: Diagnosis not present

## 2023-06-02 DIAGNOSIS — D692 Other nonthrombocytopenic purpura: Secondary | ICD-10-CM | POA: Diagnosis not present

## 2023-06-10 DIAGNOSIS — N1831 Chronic kidney disease, stage 3a: Secondary | ICD-10-CM | POA: Diagnosis not present

## 2023-06-10 DIAGNOSIS — I10 Essential (primary) hypertension: Secondary | ICD-10-CM | POA: Diagnosis not present

## 2023-06-10 DIAGNOSIS — I700 Atherosclerosis of aorta: Secondary | ICD-10-CM | POA: Diagnosis not present

## 2023-06-12 DIAGNOSIS — R5383 Other fatigue: Secondary | ICD-10-CM | POA: Diagnosis not present

## 2023-06-12 DIAGNOSIS — Z79899 Other long term (current) drug therapy: Secondary | ICD-10-CM | POA: Diagnosis not present

## 2023-06-12 DIAGNOSIS — Z111 Encounter for screening for respiratory tuberculosis: Secondary | ICD-10-CM | POA: Diagnosis not present

## 2023-06-12 DIAGNOSIS — M0589 Other rheumatoid arthritis with rheumatoid factor of multiple sites: Secondary | ICD-10-CM | POA: Diagnosis not present

## 2023-06-24 DIAGNOSIS — F419 Anxiety disorder, unspecified: Secondary | ICD-10-CM | POA: Diagnosis not present

## 2023-06-24 DIAGNOSIS — F5101 Primary insomnia: Secondary | ICD-10-CM | POA: Diagnosis not present

## 2023-07-09 DIAGNOSIS — I10 Essential (primary) hypertension: Secondary | ICD-10-CM | POA: Diagnosis not present

## 2023-07-09 DIAGNOSIS — I700 Atherosclerosis of aorta: Secondary | ICD-10-CM | POA: Diagnosis not present

## 2023-07-09 DIAGNOSIS — N1831 Chronic kidney disease, stage 3a: Secondary | ICD-10-CM | POA: Diagnosis not present

## 2023-07-16 DIAGNOSIS — I700 Atherosclerosis of aorta: Secondary | ICD-10-CM | POA: Diagnosis not present

## 2023-07-16 DIAGNOSIS — M069 Rheumatoid arthritis, unspecified: Secondary | ICD-10-CM | POA: Diagnosis not present

## 2023-07-16 DIAGNOSIS — N1831 Chronic kidney disease, stage 3a: Secondary | ICD-10-CM | POA: Diagnosis not present

## 2023-07-16 DIAGNOSIS — E785 Hyperlipidemia, unspecified: Secondary | ICD-10-CM | POA: Diagnosis not present

## 2023-07-16 DIAGNOSIS — I10 Essential (primary) hypertension: Secondary | ICD-10-CM | POA: Diagnosis not present

## 2023-08-05 DIAGNOSIS — Z6826 Body mass index (BMI) 26.0-26.9, adult: Secondary | ICD-10-CM | POA: Diagnosis not present

## 2023-08-05 DIAGNOSIS — M1991 Primary osteoarthritis, unspecified site: Secondary | ICD-10-CM | POA: Diagnosis not present

## 2023-08-05 DIAGNOSIS — N1831 Chronic kidney disease, stage 3a: Secondary | ICD-10-CM | POA: Diagnosis not present

## 2023-08-05 DIAGNOSIS — M25551 Pain in right hip: Secondary | ICD-10-CM | POA: Diagnosis not present

## 2023-08-05 DIAGNOSIS — M0589 Other rheumatoid arthritis with rheumatoid factor of multiple sites: Secondary | ICD-10-CM | POA: Diagnosis not present

## 2023-08-05 DIAGNOSIS — E663 Overweight: Secondary | ICD-10-CM | POA: Diagnosis not present

## 2023-08-05 DIAGNOSIS — M8580 Other specified disorders of bone density and structure, unspecified site: Secondary | ICD-10-CM | POA: Diagnosis not present

## 2023-08-07 DIAGNOSIS — M0589 Other rheumatoid arthritis with rheumatoid factor of multiple sites: Secondary | ICD-10-CM | POA: Diagnosis not present

## 2023-08-12 DIAGNOSIS — L820 Inflamed seborrheic keratosis: Secondary | ICD-10-CM | POA: Diagnosis not present

## 2023-08-12 DIAGNOSIS — L821 Other seborrheic keratosis: Secondary | ICD-10-CM | POA: Diagnosis not present

## 2023-08-12 DIAGNOSIS — L814 Other melanin hyperpigmentation: Secondary | ICD-10-CM | POA: Diagnosis not present

## 2023-08-16 DIAGNOSIS — M069 Rheumatoid arthritis, unspecified: Secondary | ICD-10-CM | POA: Diagnosis not present

## 2023-08-16 DIAGNOSIS — E785 Hyperlipidemia, unspecified: Secondary | ICD-10-CM | POA: Diagnosis not present

## 2023-08-16 DIAGNOSIS — I10 Essential (primary) hypertension: Secondary | ICD-10-CM | POA: Diagnosis not present

## 2023-08-16 DIAGNOSIS — N1831 Chronic kidney disease, stage 3a: Secondary | ICD-10-CM | POA: Diagnosis not present

## 2023-08-16 DIAGNOSIS — I700 Atherosclerosis of aorta: Secondary | ICD-10-CM | POA: Diagnosis not present

## 2023-08-29 DIAGNOSIS — I10 Essential (primary) hypertension: Secondary | ICD-10-CM | POA: Diagnosis not present

## 2023-08-29 DIAGNOSIS — N1831 Chronic kidney disease, stage 3a: Secondary | ICD-10-CM | POA: Diagnosis not present

## 2023-08-29 DIAGNOSIS — I700 Atherosclerosis of aorta: Secondary | ICD-10-CM | POA: Diagnosis not present

## 2023-09-30 DIAGNOSIS — H903 Sensorineural hearing loss, bilateral: Secondary | ICD-10-CM | POA: Diagnosis not present

## 2023-09-30 DIAGNOSIS — H9313 Tinnitus, bilateral: Secondary | ICD-10-CM | POA: Diagnosis not present

## 2023-10-02 DIAGNOSIS — Z79899 Other long term (current) drug therapy: Secondary | ICD-10-CM | POA: Diagnosis not present

## 2023-10-02 DIAGNOSIS — M0589 Other rheumatoid arthritis with rheumatoid factor of multiple sites: Secondary | ICD-10-CM | POA: Diagnosis not present

## 2023-10-16 DIAGNOSIS — M069 Rheumatoid arthritis, unspecified: Secondary | ICD-10-CM | POA: Diagnosis not present

## 2023-10-16 DIAGNOSIS — I700 Atherosclerosis of aorta: Secondary | ICD-10-CM | POA: Diagnosis not present

## 2023-10-16 DIAGNOSIS — N1831 Chronic kidney disease, stage 3a: Secondary | ICD-10-CM | POA: Diagnosis not present

## 2023-10-16 DIAGNOSIS — I10 Essential (primary) hypertension: Secondary | ICD-10-CM | POA: Diagnosis not present

## 2023-10-16 DIAGNOSIS — E785 Hyperlipidemia, unspecified: Secondary | ICD-10-CM | POA: Diagnosis not present

## 2023-10-21 DIAGNOSIS — H40053 Ocular hypertension, bilateral: Secondary | ICD-10-CM | POA: Diagnosis not present

## 2023-10-21 DIAGNOSIS — H35033 Hypertensive retinopathy, bilateral: Secondary | ICD-10-CM | POA: Diagnosis not present

## 2023-10-21 DIAGNOSIS — H401131 Primary open-angle glaucoma, bilateral, mild stage: Secondary | ICD-10-CM | POA: Diagnosis not present

## 2023-11-12 DIAGNOSIS — Z961 Presence of intraocular lens: Secondary | ICD-10-CM | POA: Diagnosis not present

## 2023-11-12 DIAGNOSIS — H40053 Ocular hypertension, bilateral: Secondary | ICD-10-CM | POA: Diagnosis not present

## 2023-11-12 DIAGNOSIS — H401131 Primary open-angle glaucoma, bilateral, mild stage: Secondary | ICD-10-CM | POA: Diagnosis not present

## 2023-11-16 DIAGNOSIS — I10 Essential (primary) hypertension: Secondary | ICD-10-CM | POA: Diagnosis not present

## 2023-11-16 DIAGNOSIS — N1831 Chronic kidney disease, stage 3a: Secondary | ICD-10-CM | POA: Diagnosis not present

## 2023-11-16 DIAGNOSIS — I700 Atherosclerosis of aorta: Secondary | ICD-10-CM | POA: Diagnosis not present

## 2023-11-16 DIAGNOSIS — E785 Hyperlipidemia, unspecified: Secondary | ICD-10-CM | POA: Diagnosis not present

## 2023-11-16 DIAGNOSIS — M069 Rheumatoid arthritis, unspecified: Secondary | ICD-10-CM | POA: Diagnosis not present

## 2023-11-19 DIAGNOSIS — E785 Hyperlipidemia, unspecified: Secondary | ICD-10-CM | POA: Diagnosis not present

## 2023-11-19 DIAGNOSIS — Z23 Encounter for immunization: Secondary | ICD-10-CM | POA: Diagnosis not present

## 2023-11-19 DIAGNOSIS — I700 Atherosclerosis of aorta: Secondary | ICD-10-CM | POA: Diagnosis not present

## 2023-11-19 DIAGNOSIS — R7303 Prediabetes: Secondary | ICD-10-CM | POA: Diagnosis not present

## 2023-11-19 DIAGNOSIS — I10 Essential (primary) hypertension: Secondary | ICD-10-CM | POA: Diagnosis not present

## 2023-11-19 DIAGNOSIS — D649 Anemia, unspecified: Secondary | ICD-10-CM | POA: Diagnosis not present

## 2023-11-19 DIAGNOSIS — F419 Anxiety disorder, unspecified: Secondary | ICD-10-CM | POA: Diagnosis not present

## 2023-11-19 DIAGNOSIS — M069 Rheumatoid arthritis, unspecified: Secondary | ICD-10-CM | POA: Diagnosis not present

## 2023-11-19 DIAGNOSIS — Z0000 Encounter for general adult medical examination without abnormal findings: Secondary | ICD-10-CM | POA: Diagnosis not present

## 2023-11-19 DIAGNOSIS — N1831 Chronic kidney disease, stage 3a: Secondary | ICD-10-CM | POA: Diagnosis not present

## 2023-11-19 DIAGNOSIS — F5101 Primary insomnia: Secondary | ICD-10-CM | POA: Diagnosis not present

## 2023-11-19 DIAGNOSIS — F321 Major depressive disorder, single episode, moderate: Secondary | ICD-10-CM | POA: Diagnosis not present

## 2023-11-27 DIAGNOSIS — M0589 Other rheumatoid arthritis with rheumatoid factor of multiple sites: Secondary | ICD-10-CM | POA: Diagnosis not present

## 2023-11-27 DIAGNOSIS — Z79899 Other long term (current) drug therapy: Secondary | ICD-10-CM | POA: Diagnosis not present

## 2023-12-09 DIAGNOSIS — L821 Other seborrheic keratosis: Secondary | ICD-10-CM | POA: Diagnosis not present

## 2023-12-09 DIAGNOSIS — L814 Other melanin hyperpigmentation: Secondary | ICD-10-CM | POA: Diagnosis not present

## 2023-12-09 DIAGNOSIS — D1739 Benign lipomatous neoplasm of skin and subcutaneous tissue of other sites: Secondary | ICD-10-CM | POA: Diagnosis not present

## 2023-12-16 DIAGNOSIS — E785 Hyperlipidemia, unspecified: Secondary | ICD-10-CM | POA: Diagnosis not present

## 2023-12-16 DIAGNOSIS — M069 Rheumatoid arthritis, unspecified: Secondary | ICD-10-CM | POA: Diagnosis not present

## 2023-12-16 DIAGNOSIS — I10 Essential (primary) hypertension: Secondary | ICD-10-CM | POA: Diagnosis not present

## 2023-12-16 DIAGNOSIS — I700 Atherosclerosis of aorta: Secondary | ICD-10-CM | POA: Diagnosis not present

## 2023-12-16 DIAGNOSIS — N1831 Chronic kidney disease, stage 3a: Secondary | ICD-10-CM | POA: Diagnosis not present

## 2024-01-16 DIAGNOSIS — I700 Atherosclerosis of aorta: Secondary | ICD-10-CM | POA: Diagnosis not present

## 2024-01-16 DIAGNOSIS — N1831 Chronic kidney disease, stage 3a: Secondary | ICD-10-CM | POA: Diagnosis not present

## 2024-01-16 DIAGNOSIS — E785 Hyperlipidemia, unspecified: Secondary | ICD-10-CM | POA: Diagnosis not present

## 2024-01-16 DIAGNOSIS — M069 Rheumatoid arthritis, unspecified: Secondary | ICD-10-CM | POA: Diagnosis not present

## 2024-01-16 DIAGNOSIS — I10 Essential (primary) hypertension: Secondary | ICD-10-CM | POA: Diagnosis not present

## 2024-01-26 DIAGNOSIS — M0589 Other rheumatoid arthritis with rheumatoid factor of multiple sites: Secondary | ICD-10-CM | POA: Diagnosis not present

## 2024-01-26 DIAGNOSIS — Z79899 Other long term (current) drug therapy: Secondary | ICD-10-CM | POA: Diagnosis not present

## 2024-02-15 DIAGNOSIS — I10 Essential (primary) hypertension: Secondary | ICD-10-CM | POA: Diagnosis not present

## 2024-02-15 DIAGNOSIS — E785 Hyperlipidemia, unspecified: Secondary | ICD-10-CM | POA: Diagnosis not present

## 2024-02-15 DIAGNOSIS — M069 Rheumatoid arthritis, unspecified: Secondary | ICD-10-CM | POA: Diagnosis not present

## 2024-02-15 DIAGNOSIS — N1831 Chronic kidney disease, stage 3a: Secondary | ICD-10-CM | POA: Diagnosis not present

## 2024-02-17 DIAGNOSIS — F5101 Primary insomnia: Secondary | ICD-10-CM | POA: Diagnosis not present

## 2024-02-17 DIAGNOSIS — F419 Anxiety disorder, unspecified: Secondary | ICD-10-CM | POA: Diagnosis not present

## 2024-02-17 DIAGNOSIS — E538 Deficiency of other specified B group vitamins: Secondary | ICD-10-CM | POA: Diagnosis not present

## 2024-02-18 DIAGNOSIS — H35033 Hypertensive retinopathy, bilateral: Secondary | ICD-10-CM | POA: Diagnosis not present

## 2024-02-18 DIAGNOSIS — H401131 Primary open-angle glaucoma, bilateral, mild stage: Secondary | ICD-10-CM | POA: Diagnosis not present

## 2024-02-18 DIAGNOSIS — H40053 Ocular hypertension, bilateral: Secondary | ICD-10-CM | POA: Diagnosis not present
